# Patient Record
Sex: Female | Born: 1965
Health system: Southern US, Community
[De-identification: ages and names within clinical notes are randomized; demographics above are authoritative.]

## PROBLEM LIST (undated history)

## (undated) DIAGNOSIS — R519 Headache, unspecified: Secondary | ICD-10-CM

## (undated) DIAGNOSIS — F32A Depression, unspecified: Secondary | ICD-10-CM

## (undated) DIAGNOSIS — F410 Panic disorder [episodic paroxysmal anxiety] without agoraphobia: Secondary | ICD-10-CM

## (undated) DIAGNOSIS — G8929 Other chronic pain: Secondary | ICD-10-CM

## (undated) DIAGNOSIS — K219 Gastro-esophageal reflux disease without esophagitis: Secondary | ICD-10-CM

## (undated) DIAGNOSIS — L03039 Cellulitis of unspecified toe: Secondary | ICD-10-CM

## (undated) DIAGNOSIS — N289 Disorder of kidney and ureter, unspecified: Secondary | ICD-10-CM

## (undated) DIAGNOSIS — F329 Major depressive disorder, single episode, unspecified: Secondary | ICD-10-CM

## (undated) DIAGNOSIS — F419 Anxiety disorder, unspecified: Secondary | ICD-10-CM

## (undated) DIAGNOSIS — E119 Type 2 diabetes mellitus without complications: Secondary | ICD-10-CM

## (undated) DIAGNOSIS — E7800 Pure hypercholesterolemia, unspecified: Secondary | ICD-10-CM

## (undated) DIAGNOSIS — I10 Essential (primary) hypertension: Secondary | ICD-10-CM

## (undated) DIAGNOSIS — R51 Headache: Secondary | ICD-10-CM

## (undated) DIAGNOSIS — F819 Developmental disorder of scholastic skills, unspecified: Secondary | ICD-10-CM

## (undated) DIAGNOSIS — B351 Tinea unguium: Secondary | ICD-10-CM

## (undated) DIAGNOSIS — Z87442 Personal history of urinary calculi: Secondary | ICD-10-CM

## (undated) HISTORY — PX: KIDNEY STONE SURGERY: SHX686

## (undated) HISTORY — PX: HAND SURGERY: SHX662

---

## 2003-12-08 ENCOUNTER — Other Ambulatory Visit: Payer: Self-pay

## 2004-07-09 ENCOUNTER — Other Ambulatory Visit: Payer: Self-pay

## 2004-09-22 ENCOUNTER — Emergency Department: Payer: Self-pay | Admitting: Unknown Physician Specialty

## 2004-10-18 ENCOUNTER — Emergency Department: Payer: Self-pay | Admitting: General Practice

## 2005-01-06 ENCOUNTER — Emergency Department: Payer: Self-pay | Admitting: Emergency Medicine

## 2005-11-16 ENCOUNTER — Emergency Department: Payer: Self-pay | Admitting: Emergency Medicine

## 2006-02-08 ENCOUNTER — Ambulatory Visit: Payer: Self-pay | Admitting: Internal Medicine

## 2006-02-16 ENCOUNTER — Emergency Department: Payer: Self-pay | Admitting: Emergency Medicine

## 2006-02-27 ENCOUNTER — Emergency Department: Payer: Self-pay | Admitting: Emergency Medicine

## 2006-04-11 ENCOUNTER — Inpatient Hospital Stay (HOSPITAL_COMMUNITY): Admission: RE | Admit: 2006-04-11 | Discharge: 2006-04-18 | Payer: Self-pay | Admitting: *Deleted

## 2006-04-12 ENCOUNTER — Ambulatory Visit: Payer: Self-pay | Admitting: *Deleted

## 2006-07-05 ENCOUNTER — Ambulatory Visit: Payer: Self-pay | Admitting: Gastroenterology

## 2007-04-01 ENCOUNTER — Emergency Department: Payer: Self-pay

## 2007-04-01 ENCOUNTER — Other Ambulatory Visit: Payer: Self-pay

## 2007-04-05 ENCOUNTER — Ambulatory Visit: Payer: Self-pay | Admitting: Cardiovascular Disease

## 2007-05-01 ENCOUNTER — Emergency Department: Payer: Self-pay | Admitting: Emergency Medicine

## 2007-08-21 ENCOUNTER — Ambulatory Visit: Payer: Self-pay | Admitting: Internal Medicine

## 2007-11-07 ENCOUNTER — Ambulatory Visit: Payer: Self-pay | Admitting: Internal Medicine

## 2008-03-21 ENCOUNTER — Emergency Department: Payer: Self-pay | Admitting: Unknown Physician Specialty

## 2008-06-24 ENCOUNTER — Ambulatory Visit: Payer: Self-pay | Admitting: Internal Medicine

## 2008-07-04 ENCOUNTER — Ambulatory Visit: Payer: Self-pay | Admitting: Internal Medicine

## 2008-08-04 ENCOUNTER — Ambulatory Visit: Payer: Self-pay | Admitting: Internal Medicine

## 2008-09-03 ENCOUNTER — Ambulatory Visit: Payer: Self-pay | Admitting: Internal Medicine

## 2008-11-10 ENCOUNTER — Ambulatory Visit: Payer: Self-pay | Admitting: Internal Medicine

## 2009-08-18 ENCOUNTER — Ambulatory Visit: Payer: Self-pay | Admitting: Gastroenterology

## 2009-08-21 ENCOUNTER — Emergency Department: Payer: Self-pay | Admitting: Emergency Medicine

## 2009-11-16 ENCOUNTER — Ambulatory Visit: Payer: Self-pay | Admitting: Internal Medicine

## 2010-04-25 ENCOUNTER — Ambulatory Visit: Payer: Self-pay | Admitting: Gastroenterology

## 2010-05-06 ENCOUNTER — Ambulatory Visit: Payer: Self-pay | Admitting: Internal Medicine

## 2010-05-16 ENCOUNTER — Ambulatory Visit: Payer: Self-pay | Admitting: Internal Medicine

## 2010-06-29 ENCOUNTER — Ambulatory Visit: Payer: Self-pay | Admitting: General Practice

## 2010-07-29 ENCOUNTER — Other Ambulatory Visit: Payer: Self-pay | Admitting: Physician Assistant

## 2010-08-17 ENCOUNTER — Ambulatory Visit: Payer: Self-pay | Admitting: Gastroenterology

## 2010-10-14 ENCOUNTER — Emergency Department: Payer: Self-pay | Admitting: Emergency Medicine

## 2010-11-29 ENCOUNTER — Ambulatory Visit: Payer: Self-pay | Admitting: Internal Medicine

## 2011-01-15 ENCOUNTER — Emergency Department: Payer: Self-pay | Admitting: Emergency Medicine

## 2011-03-27 ENCOUNTER — Ambulatory Visit: Payer: Self-pay | Admitting: Urology

## 2011-04-27 ENCOUNTER — Ambulatory Visit: Payer: Self-pay | Admitting: Urology

## 2011-05-02 ENCOUNTER — Ambulatory Visit: Payer: Self-pay | Admitting: Urology

## 2011-05-10 ENCOUNTER — Ambulatory Visit: Payer: Self-pay | Admitting: Urology

## 2011-05-28 ENCOUNTER — Emergency Department: Payer: Self-pay | Admitting: Emergency Medicine

## 2011-08-11 ENCOUNTER — Emergency Department: Payer: Self-pay | Admitting: Emergency Medicine

## 2011-09-18 ENCOUNTER — Ambulatory Visit: Payer: Self-pay | Admitting: Urology

## 2011-09-29 ENCOUNTER — Ambulatory Visit: Payer: Self-pay | Admitting: Urology

## 2011-10-10 ENCOUNTER — Ambulatory Visit: Payer: Self-pay | Admitting: Urology

## 2011-12-02 ENCOUNTER — Emergency Department: Payer: Self-pay | Admitting: *Deleted

## 2012-01-08 ENCOUNTER — Emergency Department: Payer: Self-pay | Admitting: Emergency Medicine

## 2012-02-14 ENCOUNTER — Emergency Department: Payer: Self-pay | Admitting: *Deleted

## 2012-02-14 LAB — BASIC METABOLIC PANEL
Anion Gap: 11 (ref 7–16)
BUN: 15 mg/dL (ref 7–18)
Calcium, Total: 8.7 mg/dL (ref 8.5–10.1)
Co2: 22 mmol/L (ref 21–32)
EGFR (Non-African Amer.): >60
Osmolality: 281 (ref 275–301)
Potassium: 4.3 mmol/L (ref 3.5–5.1)
Sodium: 141 mmol/L (ref 136–145)

## 2012-02-14 LAB — CBC
HCT: 35.2 % (ref 35.0–47.0)
HGB: 11.6 g/dL — ABNORMAL LOW (ref 12.0–16.0)
MCHC: 33.1 g/dL (ref 32.0–36.0)
MCV: 87 fL (ref 80–100)
Platelet: 314 10*3/uL (ref 150–440)
WBC: 5.2 10*3/uL (ref 3.6–11.0)

## 2012-02-14 LAB — TROPONIN I: Troponin-I: <0.02 ng/mL

## 2012-02-15 LAB — HEPATIC FUNCTION PANEL A (ARMC)
Albumin: 4.4 g/dL (ref 3.4–5.0)
Alkaline Phosphatase: 28 U/L — ABNORMAL LOW (ref 50–136)
Bilirubin, Direct: <0.1 mg/dL (ref 0.00–0.20)
Bilirubin,Total: 0.2 mg/dL (ref 0.2–1.0)
SGOT(AST): 26 U/L (ref 15–37)
Total Protein: 6.9 g/dL (ref 6.4–8.2)

## 2012-02-15 LAB — CK TOTAL AND CKMB (NOT AT ARMC)
CK, Total: 50 U/L (ref 21–215)
CK-MB: <0.5 ng/mL — ABNORMAL LOW (ref 0.5–3.6)

## 2012-02-15 LAB — TROPONIN I: Troponin-I: <0.02 ng/mL

## 2012-04-08 ENCOUNTER — Emergency Department: Payer: Self-pay | Admitting: Emergency Medicine

## 2012-04-08 LAB — COMPREHENSIVE METABOLIC PANEL
Alkaline Phosphatase: 30 U/L — ABNORMAL LOW (ref 50–136)
Calcium, Total: 8.6 mg/dL (ref 8.5–10.1)
Chloride: 108 mmol/L — ABNORMAL HIGH (ref 98–107)
Creatinine: 0.72 mg/dL (ref 0.60–1.30)
EGFR (African American): >60
EGFR (Non-African Amer.): >60
Glucose: 132 mg/dL — ABNORMAL HIGH (ref 65–99)
SGOT(AST): 19 U/L (ref 15–37)
SGPT (ALT): 34 U/L
Total Protein: 6.8 g/dL (ref 6.4–8.2)

## 2012-04-08 LAB — URINALYSIS, COMPLETE
Ketone: NEGATIVE
Nitrite: NEGATIVE
Ph: 6 (ref 4.5–8.0)
Protein: NEGATIVE
RBC,UR: 11 /HPF (ref 0–5)
Specific Gravity: 1.017 (ref 1.003–1.030)
WBC UR: 1 /HPF (ref 0–5)

## 2012-04-08 LAB — CK TOTAL AND CKMB (NOT AT ARMC)
CK, Total: 38 U/L (ref 21–215)
CK-MB: <0.5 ng/mL — ABNORMAL LOW (ref 0.5–3.6)

## 2012-04-08 LAB — CBC
HCT: 35.5 % (ref 35.0–47.0)
HGB: 11.8 g/dL — ABNORMAL LOW (ref 12.0–16.0)
MCV: 86 fL (ref 80–100)
Platelet: 253 10*3/uL (ref 150–440)
RBC: 4.13 10*6/uL (ref 3.80–5.20)
WBC: 5.8 10*3/uL (ref 3.6–11.0)

## 2012-04-08 LAB — LIPASE, BLOOD: Lipase: 80 U/L (ref 73–393)

## 2012-06-18 ENCOUNTER — Emergency Department: Payer: Self-pay | Admitting: *Deleted

## 2012-06-18 LAB — COMPREHENSIVE METABOLIC PANEL
Albumin: 4.4 g/dL (ref 3.4–5.0)
Alkaline Phosphatase: 63 U/L (ref 50–136)
BUN: 13 mg/dL (ref 7–18)
Bilirubin,Total: 0.4 mg/dL (ref 0.2–1.0)
Calcium, Total: 8.9 mg/dL (ref 8.5–10.1)
Chloride: 110 mmol/L — ABNORMAL HIGH (ref 98–107)
Co2: 25 mmol/L (ref 21–32)
Creatinine: 0.69 mg/dL (ref 0.60–1.30)
EGFR (Non-African Amer.): >60
Glucose: 104 mg/dL — ABNORMAL HIGH (ref 65–99)
Osmolality: 287 (ref 275–301)
SGOT(AST): 20 U/L (ref 15–37)
SGPT (ALT): 29 U/L
Total Protein: 7.5 g/dL (ref 6.4–8.2)

## 2012-06-18 LAB — URINALYSIS, COMPLETE
Bilirubin,UR: NEGATIVE
Glucose,UR: NEGATIVE mg/dL (ref 0–75)
Ketone: NEGATIVE
Leukocyte Esterase: NEGATIVE
Ph: 5 (ref 4.5–8.0)
Protein: 30
RBC,UR: 7 /HPF (ref 0–5)
Squamous Epithelial: 1
WBC UR: 2 /HPF (ref 0–5)

## 2012-06-18 LAB — CBC
HCT: 38.8 % (ref 35.0–47.0)
HGB: 12.5 g/dL (ref 12.0–16.0)
MCH: 27.9 pg (ref 26.0–34.0)
MCV: 87 fL (ref 80–100)
Platelet: 230 10*3/uL (ref 150–440)
RBC: 4.47 10*6/uL (ref 3.80–5.20)
WBC: 5.9 10*3/uL (ref 3.6–11.0)

## 2012-06-28 ENCOUNTER — Emergency Department: Payer: Self-pay | Admitting: Emergency Medicine

## 2013-03-11 ENCOUNTER — Ambulatory Visit: Payer: Self-pay | Admitting: Urology

## 2013-03-11 LAB — HCG, QUANTITATIVE, PREGNANCY: Beta Hcg, Quant.: <1 m[IU]/mL — ABNORMAL LOW

## 2013-03-19 ENCOUNTER — Ambulatory Visit: Payer: Self-pay

## 2013-04-14 ENCOUNTER — Ambulatory Visit: Payer: Self-pay | Admitting: Urology

## 2013-04-14 ENCOUNTER — Ambulatory Visit: Payer: Self-pay

## 2013-04-14 LAB — CBC WITH DIFFERENTIAL/PLATELET
Basophil %: 0.7 %
Eosinophil #: 0.1 10*3/uL (ref 0.0–0.7)
Eosinophil %: 2.1 %
HGB: 13 g/dL (ref 12.0–16.0)
Lymphocyte #: 1.5 10*3/uL (ref 1.0–3.6)
Lymphocyte %: 25.7 %
MCH: 29.3 pg (ref 26.0–34.0)
MCHC: 33.8 g/dL (ref 32.0–36.0)
MCV: 87 fL (ref 80–100)
Monocyte #: 0.4 x10 3/mm (ref 0.2–0.9)
Monocyte %: 7.3 %
Neutrophil %: 64.2 %
RBC: 4.43 10*6/uL (ref 3.80–5.20)
RDW: 14.4 % (ref 11.5–14.5)

## 2013-04-14 LAB — BASIC METABOLIC PANEL
BUN: 9 mg/dL (ref 7–18)
Calcium, Total: 9 mg/dL (ref 8.5–10.1)
Co2: 28 mmol/L (ref 21–32)
EGFR (Non-African Amer.): >60
Glucose: 107 mg/dL — ABNORMAL HIGH (ref 65–99)
Osmolality: 284 (ref 275–301)
Sodium: 143 mmol/L (ref 136–145)

## 2013-04-16 ENCOUNTER — Ambulatory Visit: Payer: Self-pay | Admitting: Urology

## 2013-08-19 ENCOUNTER — Ambulatory Visit: Payer: Self-pay | Admitting: Nurse Practitioner

## 2014-02-27 ENCOUNTER — Emergency Department: Payer: Self-pay | Admitting: Emergency Medicine

## 2014-03-03 ENCOUNTER — Ambulatory Visit: Payer: Self-pay | Admitting: Urology

## 2014-07-08 ENCOUNTER — Emergency Department: Payer: Self-pay | Admitting: Emergency Medicine

## 2014-07-08 LAB — BASIC METABOLIC PANEL
Anion Gap: 9 (ref 7–16)
BUN: 12 mg/dL (ref 7–18)
CHLORIDE: 111 mmol/L — AB (ref 98–107)
Calcium, Total: 8.8 mg/dL (ref 8.5–10.1)
Co2: 22 mmol/L (ref 21–32)
Creatinine: 0.7 mg/dL (ref 0.60–1.30)
EGFR (Non-African Amer.): >60
GLUCOSE: 157 mg/dL — AB (ref 65–99)
Osmolality: 286 (ref 275–301)
Potassium: 3.7 mmol/L (ref 3.5–5.1)
Sodium: 142 mmol/L (ref 136–145)

## 2014-07-08 LAB — CBC
HCT: 41.1 % (ref 35.0–47.0)
HGB: 13.4 g/dL (ref 12.0–16.0)
MCH: 30.2 pg (ref 26.0–34.0)
MCHC: 32.7 g/dL (ref 32.0–36.0)
MCV: 92 fL (ref 80–100)
Platelet: 231 10*3/uL (ref 150–440)
RBC: 4.45 10*6/uL (ref 3.80–5.20)
RDW: 14.6 % — ABNORMAL HIGH (ref 11.5–14.5)
WBC: 7.8 10*3/uL (ref 3.6–11.0)

## 2014-07-08 LAB — TROPONIN I: Troponin-I: <0.02 ng/mL

## 2014-08-13 ENCOUNTER — Emergency Department: Payer: Self-pay | Admitting: Emergency Medicine

## 2014-08-13 LAB — CBC WITH DIFFERENTIAL/PLATELET
BASOS PCT: 0.7 %
Basophil #: 0 10*3/uL (ref 0.0–0.1)
EOS ABS: 0.2 10*3/uL (ref 0.0–0.7)
Eosinophil %: 3.5 %
HCT: 40 % (ref 35.0–47.0)
HGB: 13 g/dL (ref 12.0–16.0)
LYMPHS ABS: 2.2 10*3/uL (ref 1.0–3.6)
LYMPHS PCT: 33.2 %
MCH: 29.9 pg (ref 26.0–34.0)
MCHC: 32.6 g/dL (ref 32.0–36.0)
MCV: 92 fL (ref 80–100)
MONO ABS: 0.5 x10 3/mm (ref 0.2–0.9)
Monocyte %: 8.2 %
NEUTROS ABS: 3.6 10*3/uL (ref 1.4–6.5)
NEUTROS PCT: 54.4 %
PLATELETS: 245 10*3/uL (ref 150–440)
RBC: 4.36 10*6/uL (ref 3.80–5.20)
RDW: 13.8 % (ref 11.5–14.5)
WBC: 6.6 10*3/uL (ref 3.6–11.0)

## 2014-08-13 LAB — COMPREHENSIVE METABOLIC PANEL
ALK PHOS: 55 U/L
AST: 8 U/L — AB (ref 15–37)
Albumin: 4.1 g/dL (ref 3.4–5.0)
Anion Gap: 7 (ref 7–16)
BILIRUBIN TOTAL: 0.3 mg/dL (ref 0.2–1.0)
BUN: 16 mg/dL (ref 7–18)
CO2: 26 mmol/L (ref 21–32)
CREATININE: 0.62 mg/dL (ref 0.60–1.30)
Calcium, Total: 8.8 mg/dL (ref 8.5–10.1)
Chloride: 110 mmol/L — ABNORMAL HIGH (ref 98–107)
EGFR (African American): >60
EGFR (Non-African Amer.): >60
GLUCOSE: 93 mg/dL (ref 65–99)
Osmolality: 286 (ref 275–301)
POTASSIUM: 3.5 mmol/L (ref 3.5–5.1)
SGPT (ALT): 21 U/L
Sodium: 143 mmol/L (ref 136–145)
Total Protein: 7.1 g/dL (ref 6.4–8.2)

## 2014-08-20 ENCOUNTER — Emergency Department: Payer: Self-pay | Admitting: Emergency Medicine

## 2014-08-20 LAB — CBC WITH DIFFERENTIAL/PLATELET
BASOS PCT: 0.7 %
Basophil #: 0.1 10*3/uL (ref 0.0–0.1)
EOS ABS: 0.3 10*3/uL (ref 0.0–0.7)
Eosinophil %: 3.8 %
HCT: 42.6 % (ref 35.0–47.0)
HGB: 13.6 g/dL (ref 12.0–16.0)
LYMPHS ABS: 2.3 10*3/uL (ref 1.0–3.6)
Lymphocyte %: 31 %
MCH: 29.4 pg (ref 26.0–34.0)
MCHC: 31.8 g/dL — AB (ref 32.0–36.0)
MCV: 92 fL (ref 80–100)
MONO ABS: 0.5 x10 3/mm (ref 0.2–0.9)
MONOS PCT: 6.5 %
NEUTROS ABS: 4.3 10*3/uL (ref 1.4–6.5)
Neutrophil %: 58 %
Platelet: 241 10*3/uL (ref 150–440)
RBC: 4.61 10*6/uL (ref 3.80–5.20)
RDW: 14 % (ref 11.5–14.5)
WBC: 7.4 10*3/uL (ref 3.6–11.0)

## 2014-08-20 LAB — URINALYSIS, COMPLETE
Bilirubin,UR: NEGATIVE
Glucose,UR: NEGATIVE mg/dL (ref 0–75)
KETONE: NEGATIVE
Nitrite: NEGATIVE
Ph: 5 (ref 4.5–8.0)
Protein: 30
RBC,UR: 7 /HPF (ref 0–5)
Specific Gravity: 1.019 (ref 1.003–1.030)
Squamous Epithelial: 2

## 2014-08-20 LAB — COMPREHENSIVE METABOLIC PANEL
ALT: 28 U/L
Albumin: 4.2 g/dL (ref 3.4–5.0)
Alkaline Phosphatase: 62 U/L
Anion Gap: 7 (ref 7–16)
BUN: 12 mg/dL (ref 7–18)
Bilirubin,Total: 0.3 mg/dL (ref 0.2–1.0)
CALCIUM: 8.9 mg/dL (ref 8.5–10.1)
CHLORIDE: 110 mmol/L — AB (ref 98–107)
Co2: 22 mmol/L (ref 21–32)
Creatinine: 0.55 mg/dL — ABNORMAL LOW (ref 0.60–1.30)
EGFR (African American): >60
EGFR (Non-African Amer.): >60
GLUCOSE: 98 mg/dL (ref 65–99)
Osmolality: 277 (ref 275–301)
Potassium: 3.6 mmol/L (ref 3.5–5.1)
SGOT(AST): 15 U/L (ref 15–37)
SODIUM: 139 mmol/L (ref 136–145)
Total Protein: 7.4 g/dL (ref 6.4–8.2)

## 2014-08-27 ENCOUNTER — Ambulatory Visit: Payer: Self-pay | Admitting: Nurse Practitioner

## 2014-09-07 ENCOUNTER — Ambulatory Visit: Payer: Self-pay | Admitting: Nurse Practitioner

## 2014-09-07 ENCOUNTER — Emergency Department: Payer: Self-pay | Admitting: Emergency Medicine

## 2014-09-07 LAB — URINALYSIS, COMPLETE
Bacteria: NONE SEEN
Bilirubin,UR: NEGATIVE
GLUCOSE, UR: NEGATIVE mg/dL (ref 0–75)
Ketone: NEGATIVE
NITRITE: NEGATIVE
PROTEIN: NEGATIVE
Ph: 5 (ref 4.5–8.0)
RBC,UR: 41 /HPF (ref 0–5)
SPECIFIC GRAVITY: 1.023 (ref 1.003–1.030)

## 2014-09-07 LAB — CBC WITH DIFFERENTIAL/PLATELET
Basophil #: 0.1 10*3/uL (ref 0.0–0.1)
Basophil %: 0.8 %
Eosinophil #: 0.3 10*3/uL (ref 0.0–0.7)
Eosinophil %: 4 %
HCT: 38.9 % (ref 35.0–47.0)
HGB: 12.4 g/dL (ref 12.0–16.0)
Lymphocyte #: 1.8 10*3/uL (ref 1.0–3.6)
Lymphocyte %: 28 %
MCH: 29.6 pg (ref 26.0–34.0)
MCHC: 31.9 g/dL — ABNORMAL LOW (ref 32.0–36.0)
MCV: 93 fL (ref 80–100)
Monocyte #: 0.5 x10 3/mm (ref 0.2–0.9)
Monocyte %: 8.1 %
Neutrophil #: 3.9 10*3/uL (ref 1.4–6.5)
Neutrophil %: 59.1 %
Platelet: 230 10*3/uL (ref 150–440)
RBC: 4.19 10*6/uL (ref 3.80–5.20)
RDW: 13.3 % (ref 11.5–14.5)
WBC: 6.6 10*3/uL (ref 3.6–11.0)

## 2014-09-07 LAB — COMPREHENSIVE METABOLIC PANEL
Albumin: 4 g/dL (ref 3.4–5.0)
Alkaline Phosphatase: 52 U/L
Anion Gap: 5 — ABNORMAL LOW (ref 7–16)
BUN: 16 mg/dL (ref 7–18)
Bilirubin,Total: 0.3 mg/dL (ref 0.2–1.0)
Calcium, Total: 8.5 mg/dL (ref 8.5–10.1)
Chloride: 112 mmol/L — ABNORMAL HIGH (ref 98–107)
Co2: 25 mmol/L (ref 21–32)
Creatinine: 0.58 mg/dL — ABNORMAL LOW (ref 0.60–1.30)
Glucose: 126 mg/dL — ABNORMAL HIGH (ref 65–99)
Osmolality: 286 (ref 275–301)
Potassium: 3.8 mmol/L (ref 3.5–5.1)
SGOT(AST): 9 U/L — ABNORMAL LOW (ref 15–37)
SGPT (ALT): 25 U/L
Sodium: 142 mmol/L (ref 136–145)
Total Protein: 6.6 g/dL (ref 6.4–8.2)

## 2014-09-07 LAB — PREGNANCY, URINE: Pregnancy Test, Urine: NEGATIVE m[IU]/mL

## 2014-09-07 LAB — LIPASE, BLOOD: LIPASE: 137 U/L (ref 73–393)

## 2014-09-24 ENCOUNTER — Ambulatory Visit: Payer: Self-pay

## 2014-10-18 ENCOUNTER — Emergency Department: Payer: Self-pay | Admitting: Emergency Medicine

## 2014-12-04 ENCOUNTER — Emergency Department: Payer: Self-pay | Admitting: Emergency Medicine

## 2014-12-07 LAB — BETA STREP CULTURE(ARMC)

## 2015-01-07 ENCOUNTER — Emergency Department: Payer: Self-pay | Admitting: Emergency Medicine

## 2015-03-12 ENCOUNTER — Emergency Department
Admit: 2015-03-12 | Disposition: A | Discharge status: Home / Self Care | Payer: Self-pay | Admitting: Emergency Medicine

## 2015-03-12 LAB — COMPREHENSIVE METABOLIC PANEL
ALBUMIN: 4.5 g/dL
ALK PHOS: 55 U/L
ALT: 16 U/L
Anion Gap: 9 (ref 7–16)
BUN: 16 mg/dL
Bilirubin,Total: 0.3 mg/dL
CREATININE: 0.57 mg/dL
Calcium, Total: 9.6 mg/dL
Chloride: 108 mmol/L
Co2: 24 mmol/L
EGFR (African American): >60
EGFR (Non-African Amer.): >60
Glucose: 108 mg/dL — ABNORMAL HIGH
POTASSIUM: 3.6 mmol/L
SGOT(AST): 16 U/L
SODIUM: 141 mmol/L
TOTAL PROTEIN: 7 g/dL

## 2015-03-12 LAB — CBC WITH DIFFERENTIAL/PLATELET
BASOS PCT: 0.9 %
Basophil #: 0.1 10*3/uL (ref 0.0–0.1)
Eosinophil #: 0.4 10*3/uL (ref 0.0–0.7)
Eosinophil %: 4 %
HCT: 41.1 % (ref 35.0–47.0)
HGB: 13.6 g/dL (ref 12.0–16.0)
LYMPHS ABS: 2.6 10*3/uL (ref 1.0–3.6)
Lymphocyte %: 29 %
MCH: 29.8 pg (ref 26.0–34.0)
MCHC: 33.2 g/dL (ref 32.0–36.0)
MCV: 90 fL (ref 80–100)
MONOS PCT: 9.1 %
Monocyte #: 0.8 x10 3/mm (ref 0.2–0.9)
Neutrophil #: 5.1 10*3/uL (ref 1.4–6.5)
Neutrophil %: 57 %
Platelet: 247 10*3/uL (ref 150–440)
RBC: 4.57 10*6/uL (ref 3.80–5.20)
RDW: 13.2 % (ref 11.5–14.5)
WBC: 8.9 10*3/uL (ref 3.6–11.0)

## 2015-03-12 LAB — URINALYSIS, COMPLETE
Bilirubin,UR: NEGATIVE
Glucose,UR: NEGATIVE mg/dL (ref 0–75)
Ketone: NEGATIVE
LEUKOCYTE ESTERASE: NEGATIVE
Nitrite: NEGATIVE
PH: 5 (ref 4.5–8.0)
Protein: 100
Specific Gravity: 1.028 (ref 1.003–1.030)

## 2015-03-12 LAB — PREGNANCY, URINE: Pregnancy Test, Urine: NEGATIVE m[IU]/mL

## 2015-03-14 LAB — URINE CULTURE

## 2015-03-26 NOTE — Op Note (Signed)
PATIENT NAME:  NILE, DORNING MR#:  [id number] DATE OF BIRTH:  1966-02-19  DATE OF PROCEDURE:  04/16/2013  PREOPERATIVE DIAGNOSIS: By CAT scan, a 5 mm left renal calculus.   POSTOPERATIVE DIAGNOSIS: No calculus present by digital flexible ureteroscope.   PROCEDURES:   1.  Cystoscopy.  2.  Left retrograde pyelogram.  3.  Flexible ureteroscopy.   SURGEON: Richard D. Elnoria Howard, DO  DESCRIPTION OF PROCEDURE: With the patient sterilely prepped and draped in supine lithotomy position for ease of approach to the external genitalia, the procedure was begun. The patient has good relaxation from a general anesthetic.  A cystoscope was used to view the bladder.  No tumors, masses or growths were seen in the bladder. The bladder has no trabeculation or evidence of infection. So, I do a left retrograde up the left ureteral orifice which is in a B position. The left retrograde reveals no ureteral calculi, and I see no filling defects in the kidney itself. So, I put an 0.038 wire up through the open-ended retrograde catheter, take the retrograde catheter out, put a second wire up. The first wire is a safety wire.  The second wire is used for a Navigator access catheter which is placed in the left ureter. Then a flexible ureteroscope, which is digital ureteroscope, was placed up the left ureter into the kidney. I have a beautiful wide-angle view of all calyces and find no stones. I checked every calyceal opening. Since there are no calculi, I stop the procedure, take out the wires and take out the scope and the Navigator. The bladder is emptied.  The patient has a B and O suppository placed, and she is sent to recovery in satisfactory condition.   ____________________________ Janice Coffin. Elnoria Howard, Ashley rdh:cb D: 04/16/2013 15:02:41 ET T: 04/16/2013 15:53:04 ET JOB#: [id number]  cc: Delfino Lovett D. Elnoria Howard, DO, <Dictator> RICHARD D HART DO ELECTRONICALLY SIGNED 05/01/2013 17:15

## 2015-06-12 ENCOUNTER — Emergency Department
Admission: EM | Admit: 2015-06-12 | Discharge: 2015-06-12 | Disposition: A | Discharge status: Home / Self Care | Payer: Medicare Other | Attending: Emergency Medicine | Admitting: Emergency Medicine

## 2015-06-12 DIAGNOSIS — H9203 Otalgia, bilateral: Secondary | ICD-10-CM | POA: Diagnosis not present

## 2015-06-12 MED ORDER — NAPROXEN 500 MG PO TBEC: 500.0000 mg | DELAYED_RELEASE_TABLET | Freq: Two times a day (BID) | ORAL | Status: DC

## 2015-06-12 MED ORDER — IBUPROFEN 800 MG PO TABS
800.0000 mg | ORAL_TABLET | Freq: Three times a day (TID) | ORAL | Status: DC
  Administered 2015-06-12: 800 mg via ORAL
  Filled 2015-06-12: qty 1

## 2015-06-12 NOTE — Discharge Instructions (Signed)
Otalgia Otalgia is pain in or around the ear. When the pain is from the ear itself it is called primary otalgia. Pain may also be coming from somewhere else, like the head and neck. This is called secondary otalgia.  CAUSES  Causes of primary otalgia include:  Middle ear infection.  It can also be caused by injury to the ear or infection of the ear canal (swimmer's ear). Swimmer's ear causes pain, swelling and often drainage from the ear canal. Causes of secondary otalgia include:  Sinus infections.  Allergies and colds that cause stuffiness of the nose and tubes that drain the ears (eustachian tubes).  Dental problems like cavities, gum infections or teething.  Sore Throat (tonsillitis and pharyngitis).  Swollen glands in the neck.  Infection of the bone behind the ear (mastoiditis).  TMJ discomfort (problems with the joint between your jaw and your skull).  Other problems such as nerve disorders, circulation problems, heart disease and tumors of the head and neck can also cause symptoms of ear pain. This is rare. DIAGNOSIS  Evaluation, Diagnosis and Testing:  Examination by your medical caregiver is recommended to evaluate and diagnose the cause of otalgia.  Further testing or referral to a specialist may be indicated if the cause of the ear pain is not found and the symptom persists. TREATMENT   Your doctor may prescribe antibiotics if an ear infection is diagnosed.  Pain relievers and topical analgesics may be recommended.  It is important to take all medications as prescribed. HOME CARE INSTRUCTIONS   It may be helpful to sleep with the painful ear in the up position.  A warm compress over the painful ear may provide relief.  A soft diet and avoiding gum may help while ear pain is present. SEEK IMMEDIATE MEDICAL CARE IF:  You develop severe pain, a high fever, repeated vomiting or dehydration.  You develop extreme dizziness, headache, confusion, ringing in the  ears (tinnitus) or hearing loss. Document Released: 12/28/2004 Document Revised: 02/12/2012 Document Reviewed: 09/29/2009 [facility] Patient Information 2015 [place], [place]. This information is not intended to replace advice given to you by your health care provider. Make sure you discuss any questions you have with your health care provider.  Your exam was normal today.  There is no sign of infection in the ears.  Follow-up with [facility] as needed.  Take the prescription pain medicine as needed.

## 2015-06-12 NOTE — ED Provider Notes (Signed)
[facility] Emergency Department Provider Note ____________________________________________  Time seen: 2250  I have reviewed the triage vital signs and the nursing notes.  HISTORY  Chief Complaint  Otalgia  HPI Hannah Clarke is a 49 y.o. female reports to the ED with complaints of bilateral earache since Wednesday. She denies any injury or trauma, but reports that she did spend this week at camp, and has been swimming over the last several days. She describes the pain in the right his pain worsen that in the left, but denies any ear discharge, hearing loss, or drainage from the ear. She denies any fevers, chills, sweats, vertigo, or headache. She describes the pain is worse with swallowing and chewing. She also reports some swelling to the right cheek but that has resolved with ibuprofen and Tylenol. He rates the pain at 8 out of 10 in triage.  No past medical history on file.  There are no active problems to display for this patient.  No past surgical history on file.  Current Outpatient Rx  Name  Route  Sig  Dispense  Refill  . naproxen (EC NAPROSYN) 500 MG EC tablet   Oral   Take 1 tablet (500 mg total) by mouth 2 (two) times daily with a meal.   30 tablet   0    Allergies Review of patient's allergies indicates not on file.  No family history on file.  Social History History  Substance Use Topics  . Smoking status: Not on file  . Smokeless tobacco: Not on file  . Alcohol Use: Not on file   Review of Systems  Constitutional: Negative for fever. Eyes: Negative for visual changes. ENT: Negative for sore throat. Reports R>L earaches. Cardiovascular: Negative for chest pain. Respiratory: Negative for shortness of breath. Gastrointestinal: Negative for abdominal pain, vomiting and diarrhea. Genitourinary: Negative for dysuria. Musculoskeletal: Negative for back pain. Skin: Negative for rash. Neurological: Negative for headaches, focal  weakness or numbness. ____________________________________________  PHYSICAL EXAM:  VITAL SIGNS: ED Triage Vitals  Enc Vitals Group     BP 06/12/15 2056 154/77 mmHg     Pulse Rate 06/12/15 2056 75     Resp 06/12/15 2056 20     Temp 06/12/15 2056 98.5 F (36.9 C)     Temp Source 06/12/15 2056 Oral     SpO2 06/12/15 2056 98 %     Weight 06/12/15 2056 175 lb 5 oz (79.521 kg)     Height --      Head Cir --      Peak Flow --      Pain Score 06/12/15 2059 8     Pain Loc --      Pain Edu? --      Excl. in [place]? --    Constitutional: Alert and oriented. Well appearing and in no distress. Eyes: Conjunctivae are normal. PERRL. Normal extraocular movements. ENT   Head: Normocephalic and atraumatic.   Nose: No congestion/rhinnorhea.      Ears: Canals clear bilaterally with intact TMs without effusion, erythema, bulging, or purulent material.    Mouth/Throat: Mucous membranes are moist.   Neck: Supple. No thyromegaly. Hematological/Lymphatic/Immunilogical: No cervical/preauricular/postauricular lymphadenopathy. Cardiovascular: Normal rate, regular rhythm.  Respiratory: Normal respiratory effort. No wheezes/rales/rhonchi. Gastrointestinal: Soft and nontender. No distention. Musculoskeletal: Nontender with normal range of motion in all extremities.  Neurologic:  Normal gait without ataxia. Normal speech and language. No gross focal neurologic deficits are appreciated. Skin:  Skin is warm, dry and intact. No  rash noted. Psychiatric: Mood and affect are normal. Patient exhibits appropriate insight and judgment. ____________________________________________  PROCEDURES  Ibuprofen 800 mg PO ____________________________________________  INITIAL IMPRESSION / ASSESSMENT AND PLAN / ED COURSE  Bilateral Otalgia without evidence of serous effusion, otitis externa, or TM rupture. Take the prescription Naproxen as directed. Follow-up with Vivianne Master as planned.   ____________________________________________  FINAL CLINICAL IMPRESSION(S) / ED DIAGNOSES  Final diagnoses:  Otalgia, bilateral     Melvenia Needles, PA-C 06/13/15 0014  Hinda Kehr, MD 06/13/15 0020

## 2015-06-12 NOTE — ED Notes (Signed)
Pt c/o bilateral earaches since Wednesday; says she's been swimming at camp this week; right ear hurts worse than left

## 2015-07-03 ENCOUNTER — Emergency Department
Admission: EM | Admit: 2015-07-03 | Discharge: 2015-07-03 | Disposition: A | Discharge status: Home / Self Care | Payer: Medicare Other | Attending: Emergency Medicine | Admitting: Emergency Medicine

## 2015-07-03 ENCOUNTER — Emergency Department: Payer: Medicare Other

## 2015-07-03 ENCOUNTER — Encounter: Payer: Self-pay | Admitting: Emergency Medicine

## 2015-07-03 DIAGNOSIS — E119 Type 2 diabetes mellitus without complications: Secondary | ICD-10-CM | POA: Insufficient documentation

## 2015-07-03 DIAGNOSIS — J209 Acute bronchitis, unspecified: Secondary | ICD-10-CM | POA: Insufficient documentation

## 2015-07-03 DIAGNOSIS — I10 Essential (primary) hypertension: Secondary | ICD-10-CM | POA: Diagnosis not present

## 2015-07-03 DIAGNOSIS — R05 Cough: Secondary | ICD-10-CM | POA: Diagnosis present

## 2015-07-03 HISTORY — DX: Essential (primary) hypertension: I10

## 2015-07-03 HISTORY — DX: Type 2 diabetes mellitus without complications: E11.9

## 2015-07-03 MED ORDER — AZITHROMYCIN 250 MG PO TABS: ORAL_TABLET | ORAL | Status: DC

## 2015-07-03 MED ORDER — BENZONATATE 100 MG PO CAPS: ORAL_CAPSULE | ORAL | Status: DC

## 2015-07-03 NOTE — ED Notes (Signed)
PA at bedside.

## 2015-07-03 NOTE — ED Notes (Signed)
Pt very difficult to understand at this time, unclear exactly what the c/c is. Pt clearly has cough at this time, no acute distress noted.

## 2015-07-03 NOTE — ED Notes (Signed)
Patient reports cough since Thursday. Reports she has been at camp. States she has been taking cough medicine each day.

## 2015-07-03 NOTE — ED Provider Notes (Signed)
[facility] Emergency Department Provider Note  ____________________________________________  Time seen:  5:39 PM  I have reviewed the triage vital signs and the nursing notes.   HISTORY  Chief Complaint Cough   HPI Hannah Clarke is a 49 y.o. female is here with complaint of cough 1 week. She states she's been taking cough medicine which is not working. She is unaware whether or not she has had any fever or chills. She states that she was unable to talk last week which I am assuming is laryngitis. She is very difficult to understand at times.She is in no acute distress and is able to area on a conversation in complete sentences with nonproductive cough. She is unable to place a pain scale on her cough. She reports to the nurses that she has been at camp this week.   Past Medical History  Diagnosis Date  . Hypertension   . Diabetes mellitus without complication     There are no active problems to display for this patient.   Past Surgical History  Procedure Laterality Date  . Hand surgery    . Kidney stone surgery      Current Outpatient Rx  Name  Route  Sig  Dispense  Refill  . azithromycin (ZITHROMAX Z-PAK) 250 MG tablet      Take 2 tablets (500 mg) on  Day 1,  followed by 1 tablet (250 mg) once daily on Days 2 through 5.   6 each   0   . benzonatate (TESSALON PERLES) 100 MG capsule      Take 1 - 2 every 8 hours prn cough   30 capsule   0   . naproxen (EC NAPROSYN) 500 MG EC tablet   Oral   Take 1 tablet (500 mg total) by mouth 2 (two) times daily with a meal.   30 tablet   0     Allergies Review of patient's allergies indicates no known allergies.  No family history on file.  Social History History  Substance Use Topics  . Smoking status: Never Smoker   . Smokeless tobacco: Never Used  . Alcohol Use: No    Review of Systems Constitutional: Unknown fever/positive chills ENT: Positive sore throat. Cardiovascular: Denies  chest pain. Respiratory: Positive shortness of breath with coughing. Gastrointestinal: No abdominal pain.  No nausea, no vomiting.  Genitourinary: Negative for dysuria. Musculoskeletal: Negative for back pain. Skin: Negative for rash. Neurological: Negative for headaches, focal weakness or numbness.  10-point ROS otherwise negative.  ____________________________________________   PHYSICAL EXAM:  VITAL SIGNS: ED Triage Vitals  Enc Vitals Group     BP 07/03/15 1715 127/87 mmHg     Pulse Rate 07/03/15 1715 60     Resp 07/03/15 1715 18     Temp 07/03/15 1715 98.7 F (37.1 C)     Temp Source 07/03/15 1715 Oral     SpO2 07/03/15 1715 97 %     Weight 07/03/15 1715 175 lb (79.379 kg)     Height 07/03/15 1715 5\' 3"  (1.6 m)     Head Cir --      Peak Flow --      Pain Score --      Pain Loc --      Pain Edu? --      Excl. in [place]? --     Constitutional: Alert and oriented. Well appearing and in no acute distress. Eyes: Conjunctivae are normal. PERRL. EOMI. Head: Atraumatic. Nose: Positive congestion/rhinnorhea. Mouth/Throat:  Mucous membranes are moist.  Oropharynx non-erythematous. Neck: No stridor.  Supple Hematological/Lymphatic/Immunilogical: No cervical lymphadenopathy. Cardiovascular: Normal rate, regular rhythm. Grossly normal heart sounds.  Good peripheral circulation. Respiratory: Normal respiratory effort.  No retractions. Lungs CTAB with very congested cough. Gastrointestinal: Soft and nontender. No distention. Musculoskeletal: No lower extremity tenderness nor edema.  No joint effusions. Neurologic:  Normal speech and language. No gross focal neurologic deficits are appreciated. No gait instability. Skin:  Skin is warm, dry and intact. No rash noted. Psychiatric: Mood and affect are normal. Speech and behavior are normal.  ____________________________________________   LABS (all labs ordered are listed, but only abnormal results are displayed)  Labs Reviewed - No  data to display   RADIOLOGY  Chest x-ray per radiologist and reviewed by me showed no active cardiopulmonary disease. I, Johnn Hai, personally viewed and evaluated these images as part of my medical decision making.  ____________________________________________   PROCEDURES  Procedure(s) performed: None  Critical Care performed: No  ____________________________________________   INITIAL IMPRESSION / ASSESSMENT AND PLAN / ED COURSE  Pertinent labs & imaging results that were available during my care of the patient were reviewed by me and considered in my medical decision making (see chart for details).  Patient was placed on a Z-Pak and Tessalon Perles. Patient is to follow-up with her PCP next week if any continued problems she was told she may return to the emergency room if any severe worsening of her symptoms or urgent concerns. She is also to continue Tylenol or Motrin if needed for fever. She is encouraged to drink lots of fluids. ____________________________________________   FINAL CLINICAL IMPRESSION(S) / ED DIAGNOSES  Final diagnoses:  Acute bronchitis, unspecified organism      Johnn Hai, PA-C 07/03/15 1833  Delman Kitten, MD 07/05/15 2155

## 2015-07-03 NOTE — Discharge Instructions (Signed)
Acute Bronchitis Bronchitis is when the airways that extend from the windpipe into the lungs get red, puffy, and painful (inflamed). Bronchitis often causes thick spit (mucus) to develop. This leads to a cough. A cough is the most common symptom of bronchitis. In acute bronchitis, the condition usually begins suddenly and goes away over time (usually in 2 weeks). Smoking, allergies, and asthma can make bronchitis worse. Repeated episodes of bronchitis may cause more lung problems. HOME CARE  Rest.  Drink enough fluids to keep your pee (urine) clear or pale yellow (unless you need to limit fluids as told by your doctor).  Only take over-the-counter or prescription medicines as told by your doctor.  Avoid smoking and secondhand smoke. These can make bronchitis worse. If you are a smoker, think about using nicotine gum or skin patches. Quitting smoking will help your lungs heal faster.  Reduce the chance of getting bronchitis again by:  Washing your hands often.  Avoiding people with cold symptoms.  Trying not to touch your hands to your mouth, nose, or eyes.  Follow up with your doctor as told. GET HELP IF: Your symptoms do not improve after 1 week of treatment. Symptoms include:  Cough.  Fever.  Coughing up thick spit.  Body aches.  Chest congestion.  Chills.  Shortness of breath.  Sore throat. GET HELP RIGHT AWAY IF:   You have an increased fever.  You have chills.  You have severe shortness of breath.  You have bloody thick spit (sputum).  You throw up (vomit) often.  You lose too much body fluid (dehydration).  You have a severe headache.  You faint. MAKE SURE YOU:   Understand these instructions.  Will watch your condition.  Will get help right away if you are not doing well or get worse. Document Released: 05/08/2008 Document Revised: 07/23/2013 Document Reviewed: 05/13/2013 [employer] Patient Information 2015 [employer]. This information is not  intended to replace advice given to you by your health care provider. Make sure you discuss any questions you have with your health care provider.  

## 2015-07-10 DIAGNOSIS — J40 Bronchitis, not specified as acute or chronic: Secondary | ICD-10-CM | POA: Diagnosis not present

## 2015-07-10 DIAGNOSIS — R05 Cough: Secondary | ICD-10-CM | POA: Diagnosis present

## 2015-07-10 DIAGNOSIS — Z79899 Other long term (current) drug therapy: Secondary | ICD-10-CM | POA: Insufficient documentation

## 2015-07-10 DIAGNOSIS — E119 Type 2 diabetes mellitus without complications: Secondary | ICD-10-CM | POA: Diagnosis not present

## 2015-07-10 DIAGNOSIS — M940 Chondrocostal junction syndrome [Tietze]: Secondary | ICD-10-CM | POA: Insufficient documentation

## 2015-07-10 DIAGNOSIS — R51 Headache: Secondary | ICD-10-CM | POA: Diagnosis not present

## 2015-07-10 DIAGNOSIS — I10 Essential (primary) hypertension: Secondary | ICD-10-CM | POA: Diagnosis not present

## 2015-07-10 DIAGNOSIS — Z791 Long term (current) use of non-steroidal anti-inflammatories (NSAID): Secondary | ICD-10-CM | POA: Insufficient documentation

## 2015-07-10 NOTE — ED Notes (Signed)
Pt reports cough for a week; headache last night; pt talking in complete coherent sentences;

## 2015-07-11 ENCOUNTER — Emergency Department
Admission: EM | Admit: 2015-07-11 | Discharge: 2015-07-11 | Disposition: A | Discharge status: Home / Self Care | Payer: Medicare Other | Attending: Emergency Medicine | Admitting: Emergency Medicine

## 2015-07-11 ENCOUNTER — Emergency Department: Payer: Medicare Other

## 2015-07-11 ENCOUNTER — Encounter: Payer: Self-pay | Admitting: Emergency Medicine

## 2015-07-11 DIAGNOSIS — J40 Bronchitis, not specified as acute or chronic: Secondary | ICD-10-CM

## 2015-07-11 DIAGNOSIS — R519 Headache, unspecified: Secondary | ICD-10-CM

## 2015-07-11 DIAGNOSIS — R05 Cough: Secondary | ICD-10-CM

## 2015-07-11 DIAGNOSIS — R51 Headache: Secondary | ICD-10-CM

## 2015-07-11 DIAGNOSIS — M940 Chondrocostal junction syndrome [Tietze]: Secondary | ICD-10-CM

## 2015-07-11 DIAGNOSIS — R059 Cough, unspecified: Secondary | ICD-10-CM

## 2015-07-11 MED ORDER — BENZONATATE 100 MG PO CAPS
100.0000 mg | ORAL_CAPSULE | Freq: Once | ORAL | Status: AC
  Administered 2015-07-11: 100 mg via ORAL
  Filled 2015-07-11: qty 1

## 2015-07-11 MED ORDER — KETOROLAC TROMETHAMINE 60 MG/2ML IM SOLN
60.0000 mg | Freq: Once | INTRAMUSCULAR | Status: AC
  Administered 2015-07-11: 60 mg via INTRAMUSCULAR
  Filled 2015-07-11: qty 2

## 2015-07-11 MED ORDER — BUTALBITAL-APAP-CAFFEINE 50-325-40 MG PO TABS
1.0000 | ORAL_TABLET | Freq: Once | ORAL | Status: AC
  Administered 2015-07-11: 1 via ORAL
  Filled 2015-07-11: qty 1

## 2015-07-11 MED ORDER — HYDROCOD POLST-CPM POLST ER 10-8 MG/5ML PO SUER: 5.0000 mL | Freq: Two times a day (BID) | ORAL | Status: DC

## 2015-07-11 NOTE — Discharge Instructions (Signed)
Costochondritis Costochondritis, sometimes called Tietze syndrome, is a swelling and irritation (inflammation) of the tissue (cartilage) that connects your ribs with your breastbone (sternum). It causes pain in the chest and rib area. Costochondritis usually goes away on its own over time. It can take up to 6 weeks or longer to get better, especially if you are unable to limit your activities. CAUSES  Some cases of costochondritis have no known cause. Possible causes include:  Injury (trauma).  Exercise or activity such as lifting.  Severe coughing. SIGNS AND SYMPTOMS  Pain and tenderness in the chest and rib area.  Pain that gets worse when coughing or taking deep breaths.  Pain that gets worse with specific movements. DIAGNOSIS  Your health care provider will do a physical exam and ask about your symptoms. Chest X-rays or other tests may be done to rule out other problems. TREATMENT  Costochondritis usually goes away on its own over time. Your health care provider may prescribe medicine to help relieve pain. HOME CARE INSTRUCTIONS   Avoid exhausting physical activity. Try not to strain your ribs during normal activity. This would include any activities using chest, abdominal, and side muscles, especially if heavy weights are used.  Apply ice to the affected area for the first 2 days after the pain begins.  Put ice in a plastic bag.  Place a towel between your skin and the bag.  Leave the ice on for 20 minutes, 2-3 times a day.  Only take over-the-counter or prescription medicines as directed by your health care provider. SEEK MEDICAL CARE IF:  You have redness or swelling at the rib joints. These are signs of infection.  Your pain does not go away despite rest or medicine. SEEK IMMEDIATE MEDICAL CARE IF:   Your pain increases or you are very uncomfortable.  You have shortness of breath or difficulty breathing.  You cough up blood.  You have worse chest pains,  sweating, or vomiting.  You have a fever or persistent symptoms for more than 2-3 days.  You have a fever and your symptoms suddenly get worse. MAKE SURE YOU:   Understand these instructions.  Will watch your condition.  Will get help right away if you are not doing well or get worse. Document Released: 08/30/2005 Document Revised: 09/10/2013 Document Reviewed: 06/24/2013 [facility] Patient Information 2015 [place], [place]. This information is not intended to replace advice given to you by your health care provider. Make sure you discuss any questions you have with your health care provider.  Cough, Adult  A cough is a reflex that helps clear your throat and airways. It can help heal the body or may be a reaction to an irritated airway. A cough may only last 2 or 3 weeks (acute) or may last more than 8 weeks (chronic).  CAUSES Acute cough:  Viral or bacterial infections. Chronic cough:  Infections.  Allergies.  Asthma.  Post-nasal drip.  Smoking.  Heartburn or acid reflux.  Some medicines.  Chronic lung problems (COPD).  Cancer. SYMPTOMS   Cough.  Fever.  Chest pain.  Increased breathing rate.  High-pitched whistling sound when breathing (wheezing).  Colored mucus that you cough up (sputum). TREATMENT   A bacterial cough may be treated with antibiotic medicine.  A viral cough must run its course and will not respond to antibiotics.  Your caregiver may recommend other treatments if you have a chronic cough. HOME CARE INSTRUCTIONS   Only take over-the-counter or prescription medicines for pain, discomfort, or fever  as directed by your caregiver. Use cough suppressants only as directed by your caregiver.  Use a cold steam vaporizer or humidifier in your bedroom or home to help loosen secretions.  Sleep in a semi-upright position if your cough is worse at night.  Rest as needed.  Stop smoking if you smoke. SEEK IMMEDIATE MEDICAL CARE IF:   You have  pus in your sputum.  Your cough starts to worsen.  You cannot control your cough with suppressants and are losing sleep.  You begin coughing up blood.  You have difficulty breathing.  You develop pain which is getting worse or is uncontrolled with medicine.  You have a fever. MAKE SURE YOU:   Understand these instructions.  Will watch your condition.  Will get help right away if you are not doing well or get worse. Document Released: 05/19/2011 Document Revised: 02/12/2012 Document Reviewed: 05/19/2011 [facility] Patient Information 2015 [place], [place]. This information is not intended to replace advice given to you by your health care provider. Make sure you discuss any questions you have with your health care provider.  Headaches, Frequently Asked Questions MIGRAINE HEADACHES Q: What is migraine? What causes it? How can I treat it? A: Generally, migraine headaches begin as a dull ache. Then they develop into a constant, throbbing, and pulsating pain. You may experience pain at the temples. You may experience pain at the front or back of one or both sides of the head. The pain is usually accompanied by a combination of:  Nausea.  Vomiting.  Sensitivity to light and noise. Some people (about 15%) experience an aura (see below) before an attack. The cause of migraine is believed to be chemical reactions in the brain. Treatment for migraine may include over-the-counter or prescription medications. It may also include self-help techniques. These include relaxation training and biofeedback.  Q: What is an aura? A: About 15% of people with migraine get an "aura". This is a sign of neurological symptoms that occur before a migraine headache. You may see wavy or jagged lines, dots, or flashing lights. You might experience tunnel vision or blind spots in one or both eyes. The aura can include visual or auditory hallucinations (something imagined). It may include disruptions in smell (such  as strange odors), taste or touch. Other symptoms include:  Numbness.  A "pins and needles" sensation.  Difficulty in recalling or speaking the correct word. These neurological events may last as long as 60 minutes. These symptoms will fade as the headache begins. Q: What is a trigger? A: Certain physical or environmental factors can lead to or "trigger" a migraine. These include:  Foods.  Hormonal changes.  Weather.  Stress. It is important to remember that triggers are different for everyone. To help prevent migraine attacks, you need to figure out which triggers affect you. Keep a headache diary. This is a good way to track triggers. The diary will help you talk to your healthcare professional about your condition. Q: Does weather affect migraines? A: Bright sunshine, hot, humid conditions, and drastic changes in barometric pressure may lead to, or "trigger," a migraine attack in some people. But studies have shown that weather does not act as a trigger for everyone with migraines. Q: What is the link between migraine and hormones? A: Hormones start and regulate many of your body's functions. Hormones keep your body in balance within a constantly changing environment. The levels of hormones in your body are unbalanced at times. Examples are during menstruation, pregnancy, or menopause.  That can lead to a migraine attack. In fact, about three quarters of all women with migraine report that their attacks are related to the menstrual cycle.  Q: Is there an increased risk of stroke for migraine sufferers? A: The likelihood of a migraine attack causing a stroke is very remote. That is not to say that migraine sufferers cannot have a stroke associated with their migraines. In persons under age 33, the most common associated factor for stroke is migraine headache. But over the course of a person's normal life span, the occurrence of migraine headache may actually be associated with a reduced risk  of dying from cerebrovascular disease due to stroke.  Q: What are acute medications for migraine? A: Acute medications are used to treat the pain of the headache after it has started. Examples over-the-counter medications, NSAIDs, ergots, and triptans.  Q: What are the triptans? A: Triptans are the newest class of abortive medications. They are specifically targeted to treat migraine. Triptans are vasoconstrictors. They moderate some chemical reactions in the brain. The triptans work on receptors in your brain. Triptans help to restore the balance of a neurotransmitter called serotonin. Fluctuations in levels of serotonin are thought to be a main cause of migraine.  Q: Are over-the-counter medications for migraine effective? A: Over-the-counter, or "OTC," medications may be effective in relieving mild to moderate pain and associated symptoms of migraine. But you should see your caregiver before beginning any treatment regimen for migraine.  Q: What are preventive medications for migraine? A: Preventive medications for migraine are sometimes referred to as "prophylactic" treatments. They are used to reduce the frequency, severity, and length of migraine attacks. Examples of preventive medications include antiepileptic medications, antidepressants, beta-blockers, calcium channel blockers, and NSAIDs (nonsteroidal anti-inflammatory drugs). Q: Why are anticonvulsants used to treat migraine? A: During the past few years, there has been an increased interest in antiepileptic drugs for the prevention of migraine. They are sometimes referred to as "anticonvulsants". Both epilepsy and migraine may be caused by similar reactions in the brain.  Q: Why are antidepressants used to treat migraine? A: Antidepressants are typically used to treat people with depression. They may reduce migraine frequency by regulating chemical levels, such as serotonin, in the brain.  Q: What alternative therapies are used to treat  migraine? A: The term "alternative therapies" is often used to describe treatments considered outside the scope of conventional Western medicine. Examples of alternative therapy include acupuncture, acupressure, and yoga. Another common alternative treatment is herbal therapy. Some herbs are believed to relieve headache pain. Always discuss alternative therapies with your caregiver before proceeding. Some herbal products contain arsenic and other toxins. TENSION HEADACHES Q: What is a tension-type headache? What causes it? How can I treat it? A: Tension-type headaches occur randomly. They are often the result of temporary stress, anxiety, fatigue, or anger. Symptoms include soreness in your temples, a tightening band-like sensation around your head (a "vice-like" ache). Symptoms can also include a pulling feeling, pressure sensations, and contracting head and neck muscles. The headache begins in your forehead, temples, or the back of your head and neck. Treatment for tension-type headache may include over-the-counter or prescription medications. Treatment may also include self-help techniques such as relaxation training and biofeedback. CLUSTER HEADACHES Q: What is a cluster headache? What causes it? How can I treat it? A: Cluster headache gets its name because the attacks come in groups. The pain arrives with little, if any, warning. It is usually on one side of the  head. A tearing or bloodshot eye and a runny nose on the same side of the headache may also accompany the pain. Cluster headaches are believed to be caused by chemical reactions in the brain. They have been described as the most severe and intense of any headache type. Treatment for cluster headache includes prescription medication and oxygen. SINUS HEADACHES Q: What is a sinus headache? What causes it? How can I treat it? A: When a cavity in the bones of the face and skull (a sinus) becomes inflamed, the inflammation will cause localized  pain. This condition is usually the result of an allergic reaction, a tumor, or an infection. If your headache is caused by a sinus blockage, such as an infection, you will probably have a fever. An x-ray will confirm a sinus blockage. Your caregiver's treatment might include antibiotics for the infection, as well as antihistamines or decongestants.  REBOUND HEADACHES Q: What is a rebound headache? What causes it? How can I treat it? A: A pattern of taking acute headache medications too often can lead to a condition known as "rebound headache." A pattern of taking too much headache medication includes taking it more than 2 days per week or in excessive amounts. That means more than the label or a caregiver advises. With rebound headaches, your medications not only stop relieving pain, they actually begin to cause headaches. Doctors treat rebound headache by tapering the medication that is being overused. Sometimes your caregiver will gradually substitute a different type of treatment or medication. Stopping may be a challenge. Regularly overusing a medication increases the potential for serious side effects. Consult a caregiver if you regularly use headache medications more than 2 days per week or more than the label advises. ADDITIONAL QUESTIONS AND ANSWERS Q: What is biofeedback? A: Biofeedback is a self-help treatment. Biofeedback uses special equipment to monitor your body's involuntary physical responses. Biofeedback monitors:  Breathing.  Pulse.  Heart rate.  Temperature.  Muscle tension.  Brain activity. Biofeedback helps you refine and perfect your relaxation exercises. You learn to control the physical responses that are related to stress. Once the technique has been mastered, you do not need the equipment any more. Q: Are headaches hereditary? A: Four out of five (80%) of people that suffer report a family history of migraine. Scientists are not sure if this is genetic or a family  predisposition. Despite the uncertainty, a child has a 50% chance of having migraine if one parent suffers. The child has a 75% chance if both parents suffer.  Q: Can children get headaches? A: By the time they reach high school, most young people have experienced some type of headache. Many safe and effective approaches or medications can prevent a headache from occurring or stop it after it has begun.  Q: What type of doctor should I see to diagnose and treat my headache? A: Start with your primary caregiver. Discuss his or her experience and approach to headaches. Discuss methods of classification, diagnosis, and treatment. Your caregiver may decide to recommend you to a headache specialist, depending upon your symptoms or other physical conditions. Having diabetes, allergies, etc., may require a more comprehensive and inclusive approach to your headache. The National Headache Foundation will provide, upon request, a list of [facility] physician members in your state. Document Released: 02/10/2004 Document Revised: 02/12/2012 Document Reviewed: 07/20/2008 [facility] Patient Information 2015 [place], [place]. This information is not intended to replace advice given to you by your health care provider. Make sure you discuss  any questions you have with your health care provider. ° °

## 2015-07-11 NOTE — ED Notes (Signed)
Pt reports cough x 1 week.  Pt reports taking expectorant at home.  Pt reports fever, but was afebrile in triage.  Pt reports HA and states she hasn't been drinking much water.  Pt reports n/v w/ 2 episodes.  Pt reports sore throat and sneezing. Pt NAD at this time.

## 2015-07-11 NOTE — ED Provider Notes (Signed)
[facility] Emergency Department Provider Note  ____________________________________________  Time seen: Approximately 221 AM  I have reviewed the triage vital signs and the nursing notes.   HISTORY  Chief Complaint Cough    HPI Hannah Clarke is a 49 y.o. female who reports that she's been sick for a couple of weeks. The patient reports that she went to And someone was sick at camp and she came home and was also sick. The patient was seen in the Emergency department and diagnosed with bronchitis. She was given cough medicine and azithromycin. The patient saw her primary care physician and was again diagnosed with bronchitis and given augmentin and mucinex. The patient reports that she still feels sick and wants to know why she is not better. She has taken 4 days of antibiotic. She reports that her temperature was 99.8. She has been taking ibuprofen for pain and reports her pain is a 6 out of 10 in intensity. She was also given albuterol by her primary care physician.   Past Medical History  Diagnosis Date  . Hypertension   . Diabetes mellitus without complication     There are no active problems to display for this patient.   Past Surgical History  Procedure Laterality Date  . Hand surgery    . Kidney stone surgery      Current Outpatient Rx  Name  Route  Sig  Dispense  Refill  . azithromycin (ZITHROMAX Z-PAK) 250 MG tablet      Take 2 tablets (500 mg) on  Day 1,  followed by 1 tablet (250 mg) once daily on Days 2 through 5.   6 each   0   . benzonatate (TESSALON PERLES) 100 MG capsule      Take 1 - 2 every 8 hours prn cough   30 capsule   0   . naproxen (EC NAPROSYN) 500 MG EC tablet   Oral   Take 1 tablet (500 mg total) by mouth 2 (two) times daily with a meal.   30 tablet   0   . chlorpheniramine-HYDROcodone (TUSSIONEX PENNKINETIC ER) 10-8 MG/5ML SUER   Oral   Take 5 mLs by mouth 2 (two) times daily.   100 mL   0      Allergies Review of patient's allergies indicates no known allergies.  History reviewed. No pertinent family history.  Social History History  Substance Use Topics  . Smoking status: Never Smoker   . Smokeless tobacco: Never Used  . Alcohol Use: No    Review of Systems Constitutional: fever/chills Eyes: No visual changes. ENT: sore throat. Cardiovascular: chest pain. Respiratory: Denies shortness of breath. Gastrointestinal: No abdominal pain.  No nausea, no vomiting.  No diarrhea.  No constipation. Genitourinary: Negative for dysuria. Musculoskeletal: Negative for back pain. Skin: Negative for rash. Neurological: Headache  10-point ROS otherwise negative.  ____________________________________________   PHYSICAL EXAM:  VITAL SIGNS: ED Triage Vitals  Enc Vitals Group     BP 07/10/15 2301 136/83 mmHg     Pulse Rate 07/10/15 2301 62     Resp 07/10/15 2301 20     Temp 07/10/15 2301 98.9 F (37.2 C)     Temp Source 07/10/15 2301 Oral     SpO2 07/10/15 2301 99 %     Weight 07/10/15 2301 175 lb (79.379 kg)     Height 07/10/15 2301 5\' 2"  (1.575 m)     Head Cir --      Peak Flow --  Pain Score 07/10/15 2303 8     Pain Loc --      Pain Edu? --      Excl. in [place]? --     Constitutional: Alert and oriented. Well appearing and in mild distress. Eyes: Conjunctivae are normal. PERRL. EOMI. Head: Atraumatic. Nose: No congestion/rhinnorhea. Mouth/Throat: Mucous membranes are moist.  Oropharynx non-erythematous. Cardiovascular: Normal rate, regular rhythm. Grossly normal heart sounds.  Good peripheral circulation. Respiratory: Normal respiratory effort.  No retractions. Lungs CTAB. Chest tender to palpation Gastrointestinal: Soft and nontender. No distention. No abdominal bruits. No CVA tenderness. Genitourinary: Deferred Musculoskeletal: No lower extremity tenderness nor edema.  No joint effusions. Neurologic:  Normal speech and language. No gross focal neurologic  deficits are appreciated. No gait instability. Skin:  Skin is warm, dry and intact. No rash noted. Psychiatric: Mood and affect are normal.   ____________________________________________   LABS (all labs ordered are listed, but only abnormal results are displayed)  Labs Reviewed - No data to display ____________________________________________  EKG  None ____________________________________________  RADIOLOGY  Chest x-ray: Mild bronchitic changes without focal consolidation ____________________________________________   PROCEDURES  Procedure(s) performed: None  Critical Care performed: No  ____________________________________________   INITIAL IMPRESSION / ASSESSMENT AND PLAN / ED COURSE  Pertinent labs & imaging results that were available during my care of the patient were reviewed by me and considered in my medical decision making (see chart for details).  This is a 49 year old female who comes in today with continued cough over the last 2 weeks. The patient is currently taking antibiotics and Mucinex. I did inform the patient that she would not immediately feel better that it would take some time for her to feel improved. I did give the patient a dose of Toradol and a dose of Fioricet for her headache. The patient reports that she does feel some improvement. I also gave her a dose of benzonatate. The patient will be discharged home to follow back up with her primary care physician. ____________________________________________   FINAL CLINICAL IMPRESSION(S) / ED DIAGNOSES  Final diagnoses:  Cough  Costochondritis, acute  Bronchitis  Headache, unspecified headache type      Loney Hering, MD 07/11/15 [id number]

## 2015-09-01 ENCOUNTER — Encounter: Payer: Self-pay | Admitting: Emergency Medicine

## 2015-09-01 ENCOUNTER — Emergency Department: Payer: Medicare Other

## 2015-09-01 DIAGNOSIS — R079 Chest pain, unspecified: Secondary | ICD-10-CM | POA: Diagnosis present

## 2015-09-01 DIAGNOSIS — Z791 Long term (current) use of non-steroidal anti-inflammatories (NSAID): Secondary | ICD-10-CM | POA: Diagnosis not present

## 2015-09-01 DIAGNOSIS — F419 Anxiety disorder, unspecified: Secondary | ICD-10-CM | POA: Diagnosis not present

## 2015-09-01 DIAGNOSIS — Z79899 Other long term (current) drug therapy: Secondary | ICD-10-CM | POA: Insufficient documentation

## 2015-09-01 DIAGNOSIS — F430 Acute stress reaction: Secondary | ICD-10-CM | POA: Diagnosis not present

## 2015-09-01 DIAGNOSIS — I10 Essential (primary) hypertension: Secondary | ICD-10-CM | POA: Diagnosis not present

## 2015-09-01 DIAGNOSIS — E119 Type 2 diabetes mellitus without complications: Secondary | ICD-10-CM | POA: Insufficient documentation

## 2015-09-01 DIAGNOSIS — Z792 Long term (current) use of antibiotics: Secondary | ICD-10-CM | POA: Diagnosis not present

## 2015-09-01 LAB — BASIC METABOLIC PANEL
ANION GAP: 9 (ref 5–15)
BUN: 18 mg/dL (ref 6–20)
CALCIUM: 9.3 mg/dL (ref 8.9–10.3)
CO2: 25 mmol/L (ref 22–32)
Chloride: 109 mmol/L (ref 101–111)
Creatinine, Ser: 0.52 mg/dL (ref 0.44–1.00)
GFR calc Af Amer: >60 mL/min (ref >60)
GFR calc non Af Amer: >60 mL/min (ref >60)
GLUCOSE: 148 mg/dL — AB (ref 65–99)
POTASSIUM: 3.4 mmol/L — AB (ref 3.5–5.1)
Sodium: 143 mmol/L (ref 135–145)

## 2015-09-01 LAB — CBC
HEMATOCRIT: 38.5 % (ref 35.0–47.0)
HEMOGLOBIN: 13.2 g/dL (ref 12.0–16.0)
MCH: 30.2 pg (ref 26.0–34.0)
MCHC: 34.3 g/dL (ref 32.0–36.0)
MCV: 87.9 fL (ref 80.0–100.0)
Platelets: 282 10*3/uL (ref 150–440)
RBC: 4.38 MIL/uL (ref 3.80–5.20)
RDW: 13.7 % (ref 11.5–14.5)
WBC: 7.9 10*3/uL (ref 3.6–11.0)

## 2015-09-01 LAB — TROPONIN I: Troponin I: <0.03 ng/mL (ref <0.031)

## 2015-09-01 NOTE — ED Notes (Signed)
Patient ambulatory to triage with steady gait, without difficulty or distress noted; pt reports mid CP this week; st "been upset a whole lot"; st hx of same; denies accomp symptoms

## 2015-09-02 ENCOUNTER — Emergency Department
Admission: EM | Admit: 2015-09-02 | Discharge: 2015-09-02 | Disposition: A | Discharge status: Home / Self Care | Payer: Medicare Other | Attending: Emergency Medicine | Admitting: Emergency Medicine

## 2015-09-02 DIAGNOSIS — R079 Chest pain, unspecified: Secondary | ICD-10-CM | POA: Diagnosis not present

## 2015-09-02 DIAGNOSIS — F419 Anxiety disorder, unspecified: Secondary | ICD-10-CM

## 2015-09-02 DIAGNOSIS — F439 Reaction to severe stress, unspecified: Secondary | ICD-10-CM

## 2015-09-02 LAB — TROPONIN I: Troponin I: <0.03 ng/mL (ref <0.031)

## 2015-09-02 MED ORDER — LORAZEPAM 1 MG PO TABS
1.0000 mg | ORAL_TABLET | Freq: Once | ORAL | Status: AC
  Administered 2015-09-02: 1 mg via ORAL
  Filled 2015-09-02: qty 1

## 2015-09-02 NOTE — ED Provider Notes (Signed)
[facility] Emergency Department Provider Note  ____________________________________________  Time seen: Approximately 12:41 AM  I have reviewed the triage vital signs and the nursing notes.   HISTORY  Chief Complaint Chest Pain    HPI Hannah Clarke is a 49 y.o. female who presents to the ED from a [facility] with a chief complaint of chest pain. Reports central chest tightness waxing and waning 4 days, associated with emotional upset. States she has been arguing with her mother lately, and her chest becomes tight when she feels upset or stressed. Symptoms not associated with diaphoresis, shortness of breath or nausea. Patient does not have a history of coronary artery disease. Denies recent travel or hormone use. Denies recent fever, chills, cough, abdominal pain, nausea, vomiting, diarrhea. Nothing makes the pain better. Arguing makes the pain worse. States she spoke with her therapist this afternoon and declines offer for behavioral medicine consult. Denies active SI/HI/AH/VH.  Past Medical History  Diagnosis Date  . Hypertension   . Diabetes mellitus without complication   Depression  There are no active problems to display for this patient.   Past Surgical History  Procedure Laterality Date  . Hand surgery    . Kidney stone surgery      Current Outpatient Rx  Name  Route  Sig  Dispense  Refill  . azithromycin (ZITHROMAX Z-PAK) 250 MG tablet      Take 2 tablets (500 mg) on  Day 1,  followed by 1 tablet (250 mg) once daily on Days 2 through 5.   6 each   0   . benzonatate (TESSALON PERLES) 100 MG capsule      Take 1 - 2 every 8 hours prn cough   30 capsule   0   . chlorpheniramine-HYDROcodone (TUSSIONEX PENNKINETIC ER) 10-8 MG/5ML SUER   Oral   Take 5 mLs by mouth 2 (two) times daily.   100 mL   0   . naproxen (EC NAPROSYN) 500 MG EC tablet   Oral   Take 1 tablet (500 mg total) by mouth 2 (two) times daily with a meal.    30 tablet   0     Allergies Review of patient's allergies indicates no known allergies.  No family history on file.  Social History Social History  Substance Use Topics  . Smoking status: Never Smoker   . Smokeless tobacco: Never Used  . Alcohol Use: No    Review of Systems Constitutional: No fever/chills Eyes: No visual changes. ENT: No sore throat. Cardiovascular: Positive for chest pain. Respiratory: Denies shortness of breath. Gastrointestinal: No abdominal pain.  No nausea, no vomiting.  No diarrhea.  No constipation. Genitourinary: Negative for dysuria. Musculoskeletal: Negative for back pain. Skin: Negative for rash. Neurological: Negative for headaches, focal weakness or numbness. Psychiatric:Positive for emotional upset and anxiety.  10-point ROS otherwise negative.  ____________________________________________   PHYSICAL EXAM:  VITAL SIGNS: ED Triage Vitals  Enc Vitals Group     BP 09/01/15 2207 145/99 mmHg     Pulse Rate 09/01/15 2207 72     Resp 09/01/15 2207 18     Temp 09/01/15 2207 98.6 F (37 C)     Temp Source 09/01/15 2207 Oral     SpO2 09/01/15 2207 98 %     Weight 09/01/15 2207 170 lb (77.111 kg)     Height 09/01/15 2207 5\' 2"  (1.575 m)     Head Cir --      Peak  Flow --      Pain Score 09/01/15 2207 9     Pain Loc --      Pain Edu? --      Excl. in [place]? --     Constitutional: Alert and oriented. Well appearing and mildly anxious. Eyes: Conjunctivae are normal. PERRL. EOMI. Head: Atraumatic. Nose: No congestion/rhinnorhea. Mouth/Throat: Mucous membranes are moist.  Oropharynx non-erythematous. Neck: No stridor. No carotid bruits. Cardiovascular: Normal rate, regular rhythm. Grossly normal heart sounds.  Good peripheral circulation. Respiratory: Normal respiratory effort.  No retractions. Lungs CTAB. Gastrointestinal: Soft and nontender. No distention. No abdominal bruits. No CVA tenderness. Musculoskeletal: No lower extremity  tenderness nor edema.  No joint effusions. Neurologic:  Normal speech and language. No gross focal neurologic deficits are appreciated. No gait instability. Skin:  Skin is warm, dry and intact. No rash noted. Psychiatric: Mood and affect are flat. Speech and behavior are flat.  ____________________________________________   LABS (all labs ordered are listed, but only abnormal results are displayed)  Labs Reviewed  BASIC METABOLIC PANEL - Abnormal; Notable for the following:    Potassium 3.4 (*)    Glucose, Bld 148 (*)    All other components within normal limits  CBC  TROPONIN I  TROPONIN I   ____________________________________________  EKG  ED ECG REPORT I, Teven Mittman J, the attending physician, personally viewed and interpreted this ECG.   Date: 09/02/2015  EKG Time: 2208  Rate: 70  Rhythm: normal EKG, normal sinus rhythm  Axis: Normal  Intervals:none  ST&T Change: Nonspecific  ____________________________________________  RADIOLOGY  Chest 2 view (viewed by me, interpreted per Dr. Augustin Coupe): No active cardiopulmonary disease. ____________________________________________   PROCEDURES  Procedure(s) performed: None  Critical Care performed: No  ____________________________________________   INITIAL IMPRESSION / ASSESSMENT AND PLAN / ED COURSE  Pertinent labs & imaging results that were available during my care of the patient were reviewed by me and considered in my medical decision making (see chart for details).  49 year old female from group home who presents with chest tightness associated with emotional upset. First troponin negative. Will repeat 3 hour troponin, administer anxiolytic and reassess.  ----------------------------------------- 2:49 AM on 09/02/2015 -----------------------------------------  Repeat troponin negative. Patient improved. Strict return precautions given. Patient verbalizes understanding and agrees with plan of  care. ____________________________________________   FINAL CLINICAL IMPRESSION(S) / ED DIAGNOSES  Final diagnoses:  Chest pain, unspecified chest pain type  Anxiety  Stress      Hannah Blanch, MD 09/02/15 [phone]

## 2015-09-02 NOTE — Discharge Instructions (Signed)
1. Continue all medications as directed by your doctor. 2. Return to the ER for worsening symptoms, persistent vomiting, difficulty breathing, feelings of hurting yourself or others, or other concerns.  Chest Pain (Nonspecific) It is often hard to give a specific diagnosis for the cause of chest pain. There is always a chance that your pain could be related to something serious, such as a heart attack or a blood clot in the lungs. You need to follow up with your health care provider for further evaluation. CAUSES   Heartburn.  Pneumonia or bronchitis.  Anxiety or stress.  Inflammation around your heart (pericarditis) or lung (pleuritis or pleurisy).  A blood clot in the lung.  A collapsed lung (pneumothorax). It can develop suddenly on its own (spontaneous pneumothorax) or from trauma to the chest.  Shingles infection (herpes zoster virus). The chest wall is composed of bones, muscles, and cartilage. Any of these can be the source of the pain.  The bones can be bruised by injury.  The muscles or cartilage can be strained by coughing or overwork.  The cartilage can be affected by inflammation and become sore (costochondritis). DIAGNOSIS  Lab tests or other studies may be needed to find the cause of your pain. Your health care provider may have you take a test called an ambulatory electrocardiogram (ECG). An ECG records your heartbeat patterns over a 24-hour period. You may also have other tests, such as:  Transthoracic echocardiogram (TTE). During echocardiography, sound waves are used to evaluate how blood flows through your heart.  Transesophageal echocardiogram (TEE).  Cardiac monitoring. This allows your health care provider to monitor your heart rate and rhythm in real time.  Holter monitor. This is a portable device that records your heartbeat and can help diagnose heart arrhythmias. It allows your health care provider to track your heart activity for several days, if  needed.  Stress tests by exercise or by giving medicine that makes the heart beat faster. TREATMENT   Treatment depends on what may be causing your chest pain. Treatment may include:  Acid blockers for heartburn.  Anti-inflammatory medicine.  Pain medicine for inflammatory conditions.  Antibiotics if an infection is present.  You may be advised to change lifestyle habits. This includes stopping smoking and avoiding alcohol, caffeine, and chocolate.  You may be advised to keep your head raised (elevated) when sleeping. This reduces the chance of acid going backward from your stomach into your esophagus. Most of the time, nonspecific chest pain will improve within 2-3 days with rest and mild pain medicine.  HOME CARE INSTRUCTIONS   If antibiotics were prescribed, take them as directed. Finish them even if you start to feel better.  For the next few days, avoid physical activities that bring on chest pain. Continue physical activities as directed.  Do not use any tobacco products, including cigarettes, chewing tobacco, or electronic cigarettes.  Avoid drinking alcohol.  Only take medicine as directed by your health care provider.  Follow your health care provider's suggestions for further testing if your chest pain does not go away.  Keep any follow-up appointments you made. If you do not go to an appointment, you could develop lasting (chronic) problems with pain. If there is any problem keeping an appointment, call to reschedule. SEEK MEDICAL CARE IF:   Your chest pain does not go away, even after treatment.  You have a rash with blisters on your chest.  You have a fever. SEEK IMMEDIATE MEDICAL CARE IF:   You  have increased chest pain or pain that spreads to your arm, neck, jaw, back, or abdomen.  You have shortness of breath.  You have an increasing cough, or you cough up blood.  You have severe back or abdominal pain.  You feel nauseous or vomit.  You have severe  weakness.  You faint.  You have chills. This is an emergency. Do not wait to see if the pain will go away. Get medical help at once. Call your local emergency services (911 in [place] Do not drive yourself to the hospital. MAKE SURE YOU:   Understand these instructions.  Will watch your condition.  Will get help right away if you are not doing well or get worse. Document Released: 08/30/2005 Document Revised: 11/25/2013 Document Reviewed: 06/25/2008 [facility] Patient Information 2015 [place], [place]. This information is not intended to replace advice given to you by your health care provider. Make sure you discuss any questions you have with your health care provider.  Stress Stress-related medical problems are becoming increasingly common. The body has a built-in physical response to stressful situations. Faced with pressure, challenge or danger, we need to react quickly. Our bodies release hormones such as cortisol and adrenaline to help do this. These hormones are part of the "fight or flight" response and affect the metabolic rate, heart rate and blood pressure, resulting in a heightened, stressed state that prepares the body for optimum performance in dealing with a stressful situation. It is likely that early man required these mechanisms to stay alive, but usually modern stresses do not call for this, and the same hormones released in today's world can damage health and reduce coping ability. CAUSES  Pressure to perform at work, at school or in sports.  Threats of physical violence.  Money worries.  Arguments.  Family conflicts.  Divorce or separation from significant other.  Bereavement.  New job or unemployment.  Changes in location.  Alcohol or drug abuse. SOMETIMES, THERE IS NO PARTICULAR REASON FOR DEVELOPING STRESS. Almost all people are at risk of being stressed at some time in their lives. It is important to know that some stress is temporary and some is long  term.  Temporary stress will go away when a situation is resolved. Most people can cope with short periods of stress, and it can often be relieved by relaxing, taking a walk or getting any type of exercise, chatting through issues with friends, or having a good night's sleep.  Chronic (long-term, continuous) stress is much harder to deal with. It can be psychologically and emotionally damaging. It can be harmful both for an individual and for friends and family. SYMPTOMS Everyone reacts to stress differently. There are some common effects that help [place] recognize it. In times of extreme stress, people may:  Shake uncontrollably.  Breathe faster and deeper than normal (hyperventilate).  Vomit.  For people with asthma, stress can trigger an attack.  For some people, stress may trigger migraine headaches, ulcers, and body pain. PHYSICAL EFFECTS OF STRESS MAY INCLUDE:  Loss of energy.  Skin problems.  Aches and pains resulting from tense muscles, including neck ache, backache and tension headaches.  Increased pain from arthritis and other conditions.  Irregular heart beat (palpitations).  Periods of irritability or anger.  Apathy or depression.  Anxiety (feeling uptight or worrying).  Unusual behavior.  Loss of appetite.  Comfort eating.  Lack of concentration.  Loss of, or decreased, sex-drive.  Increased smoking, drinking, or recreational drug use.  For women, missed  periods.  Ulcers, joint pain, and muscle pain. Post-traumatic stress is the stress caused by any serious accident, strong emotional damage, or extremely difficult or violent experience such as rape or war. Post-traumatic stress victims can experience mixtures of emotions such as fear, shame, depression, guilt or anger. It may include recurrent memories or images that may be haunting. These feelings can last for weeks, months or even years after the traumatic event that triggered them. Specialized treatment,  possibly with medicines and psychological therapies, is available. If stress is causing physical symptoms, severe distress or making it difficult for you to function as normal, it is worth seeing your caregiver. It is important to remember that although stress is a usual part of life, extreme or prolonged stress can lead to other illnesses that will need treatment. It is better to visit a doctor sooner rather than later. Stress has been linked to the development of high blood pressure and heart disease, as well as insomnia and depression. There is no diagnostic test for stress since everyone reacts to it differently. But a caregiver will be able to spot the physical symptoms, such as:  Headaches.  Shingles.  Ulcers. Emotional distress such as intense worry, low mood or irritability should be detected when the doctor asks pertinent questions to identify any underlying problems that might be the cause. In case there are physical reasons for the symptoms, the doctor may also want to do some tests to exclude certain conditions. If you feel that you are suffering from stress, try to identify the aspects of your life that are causing it. Sometimes you may not be able to change or avoid them, but even a small change can have a positive ripple effect. A simple lifestyle change can make all the difference. STRATEGIES THAT CAN HELP DEAL WITH STRESS:  Delegating or sharing responsibilities.  Avoiding confrontations.  Learning to be more assertive.  Regular exercise.  Avoid using alcohol or street drugs to cope.  Eating a healthy, balanced diet, rich in fruit and vegetables and proteins.  Finding humor or absurdity in stressful situations.  Never taking on more than you know you can handle comfortably.  Organizing your time better to get as much done as possible.  Talking to friends or family and sharing your thoughts and fears.  Listening to music or relaxation tapes.  Relaxation techniques  like deep breathing, meditation, and yoga.  Tensing and then relaxing your muscles, starting at the toes and working up to the head and neck. If you think that you would benefit from help, either in identifying the things that are causing your stress or in learning techniques to help you relax, see a caregiver who is capable of helping you with this. Rather than relying on medications, it is usually better to try and identify the things in your life that are causing stress and try to deal with them. There are many techniques of managing stress including counseling, psychotherapy, aromatherapy, yoga, and exercise. Your caregiver can help you determine what is best for you. Document Released: 02/10/2003 Document Revised: 11/25/2013 Document Reviewed: 01/07/2008 [facility] Patient Information 2015 [place], [place]. This information is not intended to replace advice given to you by your health care provider. Make sure you discuss any questions you have with your health care provider.

## 2015-09-06 ENCOUNTER — Other Ambulatory Visit: Payer: Self-pay | Admitting: Nurse Practitioner

## 2015-09-06 DIAGNOSIS — Z1231 Encounter for screening mammogram for malignant neoplasm of breast: Secondary | ICD-10-CM

## 2015-09-14 ENCOUNTER — Ambulatory Visit
Admission: RE | Admit: 2015-09-14 | Discharge: 2015-09-14 | Disposition: A | Discharge status: Home / Self Care | Payer: Medicare Other | Source: Ambulatory Visit | Attending: Nurse Practitioner | Admitting: Nurse Practitioner

## 2015-09-14 DIAGNOSIS — Z1231 Encounter for screening mammogram for malignant neoplasm of breast: Secondary | ICD-10-CM | POA: Diagnosis not present

## 2015-10-16 ENCOUNTER — Emergency Department
Admission: EM | Admit: 2015-10-16 | Discharge: 2015-10-17 | Disposition: A | Discharge status: Home / Self Care | Payer: Medicare Other | Attending: Emergency Medicine | Admitting: Emergency Medicine

## 2015-10-16 ENCOUNTER — Encounter: Payer: Self-pay | Admitting: Emergency Medicine

## 2015-10-16 DIAGNOSIS — G8929 Other chronic pain: Secondary | ICD-10-CM | POA: Insufficient documentation

## 2015-10-16 DIAGNOSIS — E119 Type 2 diabetes mellitus without complications: Secondary | ICD-10-CM | POA: Insufficient documentation

## 2015-10-16 DIAGNOSIS — M25572 Pain in left ankle and joints of left foot: Secondary | ICD-10-CM | POA: Diagnosis present

## 2015-10-16 DIAGNOSIS — I10 Essential (primary) hypertension: Secondary | ICD-10-CM | POA: Insufficient documentation

## 2015-10-16 DIAGNOSIS — M79672 Pain in left foot: Secondary | ICD-10-CM | POA: Insufficient documentation

## 2015-10-16 DIAGNOSIS — Z79899 Other long term (current) drug therapy: Secondary | ICD-10-CM | POA: Insufficient documentation

## 2015-10-16 DIAGNOSIS — Z791 Long term (current) use of non-steroidal anti-inflammatories (NSAID): Secondary | ICD-10-CM | POA: Diagnosis not present

## 2015-10-16 DIAGNOSIS — Z792 Long term (current) use of antibiotics: Secondary | ICD-10-CM | POA: Diagnosis not present

## 2015-10-16 HISTORY — DX: Pure hypercholesterolemia, unspecified: E78.00

## 2015-10-16 MED ORDER — KETOROLAC TROMETHAMINE 60 MG/2ML IM SOLN
60.0000 mg | Freq: Once | INTRAMUSCULAR | Status: AC
Start: 2015-10-17 — End: 2015-10-17
  Administered 2015-10-17: 60 mg via INTRAMUSCULAR
  Filled 2015-10-16: qty 2

## 2015-10-17 DIAGNOSIS — M79672 Pain in left foot: Secondary | ICD-10-CM | POA: Diagnosis not present

## 2015-10-17 NOTE — Discharge Instructions (Signed)
Wear comfortable, well cushioned shoes at all times to help with your heel spurs and foot pain. Avoid prolonged standing if possible. Shoe inserts like Dr. Idolina Primer may help.

## 2015-10-17 NOTE — ED Provider Notes (Signed)
[facility] Emergency Department Provider Note  ____________________________________________  Time seen: 11:45 PM  I have reviewed the triage vital signs and the nursing notes.   HISTORY  Chief Complaint Ankle Pain    HPI Hannah Clarke is a 49 y.o. female who complains of chronic left foot pain in the heel. She states that she's been seen by her doctor for this and told she has heel spurs. The pain is worse after she has been standing all day at her job at [employer]. Denies any trauma. No leg swelling, no chest pain shortness of breath fever or chills.No numbness tingling or weakness.     Past Medical History  Diagnosis Date  . Hypertension   . Diabetes mellitus without complication ([place])   . High cholesterol      There are no active problems to display for this patient.    Past Surgical History  Procedure Laterality Date  . Hand surgery    . Kidney stone surgery       Current Outpatient Rx  Name  Route  Sig  Dispense  Refill  . azithromycin (ZITHROMAX Z-PAK) 250 MG tablet      Take 2 tablets (500 mg) on  Day 1,  followed by 1 tablet (250 mg) once daily on Days 2 through 5.   6 each   0   . benzonatate (TESSALON PERLES) 100 MG capsule      Take 1 - 2 every 8 hours prn cough   30 capsule   0   . chlorpheniramine-HYDROcodone (TUSSIONEX PENNKINETIC ER) 10-8 MG/5ML SUER   Oral   Take 5 mLs by mouth 2 (two) times daily.   100 mL   0   . naproxen (EC NAPROSYN) 500 MG EC tablet   Oral   Take 1 tablet (500 mg total) by mouth 2 (two) times daily with a meal.   30 tablet   0      Allergies Review of patient's allergies indicates no known allergies.   Family History  Problem Relation Age of Onset  . Breast cancer Mother 69    pre pt.     Social History Social History  Substance Use Topics  . Smoking status: Never Smoker   . Smokeless tobacco: Never Used  . Alcohol Use: No    Review of Systems  Constitutional:    No fever or chills. No weight changes Eyes:   No blurry vision or double vision.  ENT:   No sore throat. Cardiovascular:   No chest pain. Respiratory:   No dyspnea or cough. Gastrointestinal:   Negative for abdominal pain, vomiting and diarrhea.  No BRBPR or melena. Genitourinary:   Negative for dysuria, urinary retention, bloody urine, or difficulty urinating. Musculoskeletal:   Chronic left foot pain Skin:   Negative for rash. Neurological:   Negative for headaches, focal weakness or numbness. Psychiatric:  No anxiety or depression.   Endocrine:  No hot/cold intolerance, changes in energy, or sleep difficulty.  10-point ROS otherwise negative.  ____________________________________________   PHYSICAL EXAM:  VITAL SIGNS: ED Triage Vitals  Enc Vitals Group     BP 10/16/15 2209 171/98 mmHg     Pulse Rate 10/16/15 2209 73     Resp 10/16/15 2209 20     Temp 10/16/15 2209 98.2 F (36.8 C)     Temp Source 10/16/15 2209 Oral     SpO2 10/16/15 2209 100 %     Weight 10/16/15 2209 170 lb (77.111  kg)     Height 10/16/15 2209 5\' 2"  (1.575 m)     Head Cir --      Peak Flow --      Pain Score 10/16/15 2210 8     Pain Loc --      Pain Edu? --      Excl. in [place]? --      Constitutional:   Alert and oriented. Well appearing and in no distress. Eyes:   No scleral icterus. No conjunctival pallor. PERRL. EOMI ENT   Head:   Normocephalic and atraumatic.   Nose:   No congestion/rhinnorhea. No septal hematoma   Mouth/Throat:   MMM, no pharyngeal erythema. No peritonsillar mass. No uvula shift.   Neck:   No stridor. No SubQ emphysema. No meningismus. Hematological/Lymphatic/Immunilogical:   No cervical lymphadenopathy. Cardiovascular:   RRR. Normal and symmetric distal pulses are present in all extremities. No murmurs, rubs, or gallops. Respiratory:   Normal respiratory effort without tachypnea nor retractions. Breath sounds are clear and equal bilaterally. No  wheezes/rales/rhonchi. Gastrointestinal:   Soft and nontender. No distention. There is no CVA tenderness.  No rebound, rigidity, or guarding. Genitourinary:   deferred Musculoskeletal:   Nontender with normal range of motion in all extremities. No joint effusions.  There is some mild point tenderness at the plantar surface of the calcaneus near the insertion of the plantar fascia. No wounds or inflammation. No tenderness along the plantar fascia or arch. Tolerates gentle stretching of the sole of the foot..  No edema. Neurologic:   Normal speech and language.  CN 2-10 normal. Motor grossly intact.  Normal gait. No gross focal neurologic deficits are appreciated.  Skin:    Skin is warm, dry and intact. No rash noted.  No petechiae, purpura, or bullae. Psychiatric:   Mood and affect are normal. Speech and behavior are normal. Patient exhibits appropriate insight and judgment.  The patient shoes are missing the insoles, causing the patient to walk on the hard inner surface of the shoe ____________________________________________    LABS (pertinent positives/negatives) (all labs ordered are listed, but only abnormal results are displayed) Labs Reviewed - No data to display ____________________________________________   EKG    ____________________________________________    RADIOLOGY    ____________________________________________   PROCEDURES   ____________________________________________   INITIAL IMPRESSION / ASSESSMENT AND PLAN / ED COURSE  Pertinent labs & imaging results that were available during my care of the patient were reviewed by me and considered in my medical decision making (see chart for details).  Patient presents with chronic left foot pain which appears to be consistent with heel spur. Low suspicion for fracture or dislocation, no evidence of any infection including osteomyelitis. Patient counseled on conservative measures for heel spur including NSAIDs,  comfortable shoes, shoe inserts. Follow-up with primary care or podiatry.     ____________________________________________   FINAL CLINICAL IMPRESSION(S) / ED DIAGNOSES  Final diagnoses:  Chronic heel pain, left      [name], MD 10/17/15 [phone]

## 2015-11-07 ENCOUNTER — Emergency Department
Admission: EM | Admit: 2015-11-07 | Discharge: 2015-11-08 | Disposition: A | Discharge status: Home / Self Care | Payer: Medicare Other | Attending: Emergency Medicine | Admitting: Emergency Medicine

## 2015-11-07 ENCOUNTER — Encounter: Payer: Self-pay | Admitting: Emergency Medicine

## 2015-11-07 DIAGNOSIS — R05 Cough: Secondary | ICD-10-CM | POA: Diagnosis not present

## 2015-11-07 DIAGNOSIS — R519 Headache, unspecified: Secondary | ICD-10-CM

## 2015-11-07 DIAGNOSIS — I10 Essential (primary) hypertension: Secondary | ICD-10-CM | POA: Insufficient documentation

## 2015-11-07 DIAGNOSIS — Z792 Long term (current) use of antibiotics: Secondary | ICD-10-CM | POA: Insufficient documentation

## 2015-11-07 DIAGNOSIS — E119 Type 2 diabetes mellitus without complications: Secondary | ICD-10-CM | POA: Insufficient documentation

## 2015-11-07 DIAGNOSIS — Z79899 Other long term (current) drug therapy: Secondary | ICD-10-CM | POA: Insufficient documentation

## 2015-11-07 DIAGNOSIS — R112 Nausea with vomiting, unspecified: Secondary | ICD-10-CM | POA: Diagnosis present

## 2015-11-07 DIAGNOSIS — Z3202 Encounter for pregnancy test, result negative: Secondary | ICD-10-CM | POA: Insufficient documentation

## 2015-11-07 DIAGNOSIS — Z791 Long term (current) use of non-steroidal anti-inflammatories (NSAID): Secondary | ICD-10-CM | POA: Insufficient documentation

## 2015-11-07 DIAGNOSIS — R51 Headache: Secondary | ICD-10-CM | POA: Diagnosis not present

## 2015-11-07 HISTORY — DX: Headache, unspecified: R51.9

## 2015-11-07 HISTORY — DX: Headache: R51

## 2015-11-07 HISTORY — DX: Gastro-esophageal reflux disease without esophagitis: K21.9

## 2015-11-07 HISTORY — DX: Other chronic pain: G89.29

## 2015-11-07 HISTORY — DX: Anxiety disorder, unspecified: F41.9

## 2015-11-07 LAB — CBC
HCT: 38.6 % (ref 35.0–47.0)
Hemoglobin: 13.2 g/dL (ref 12.0–16.0)
MCH: 30.3 pg (ref 26.0–34.0)
MCHC: 34.3 g/dL (ref 32.0–36.0)
MCV: 88.2 fL (ref 80.0–100.0)
PLATELETS: 252 10*3/uL (ref 150–440)
RBC: 4.38 MIL/uL (ref 3.80–5.20)
RDW: 14.1 % (ref 11.5–14.5)
WBC: 6.1 10*3/uL (ref 3.6–11.0)

## 2015-11-07 LAB — URINALYSIS COMPLETE WITH MICROSCOPIC (ARMC ONLY)
Bilirubin Urine: NEGATIVE
Glucose, UA: NEGATIVE mg/dL
KETONES UR: NEGATIVE mg/dL
NITRITE: NEGATIVE
PH: 5 (ref 5.0–8.0)
Protein, ur: NEGATIVE mg/dL
SPECIFIC GRAVITY, URINE: 1.009 (ref 1.005–1.030)

## 2015-11-07 LAB — COMPREHENSIVE METABOLIC PANEL
ALT: 15 U/L (ref 14–54)
ANION GAP: 9 (ref 5–15)
AST: 11 U/L — ABNORMAL LOW (ref 15–41)
Albumin: 4.3 g/dL (ref 3.5–5.0)
Alkaline Phosphatase: 55 U/L (ref 38–126)
BILIRUBIN TOTAL: 0.5 mg/dL (ref 0.3–1.2)
BUN: 12 mg/dL (ref 6–20)
CHLORIDE: 111 mmol/L (ref 101–111)
CO2: 22 mmol/L (ref 22–32)
Calcium: 9.4 mg/dL (ref 8.9–10.3)
Creatinine, Ser: 0.49 mg/dL (ref 0.44–1.00)
Glucose, Bld: 125 mg/dL — ABNORMAL HIGH (ref 65–99)
POTASSIUM: 3.8 mmol/L (ref 3.5–5.1)
Sodium: 142 mmol/L (ref 135–145)
TOTAL PROTEIN: 7.1 g/dL (ref 6.5–8.1)

## 2015-11-07 LAB — LIPASE, BLOOD: LIPASE: 20 U/L (ref 11–51)

## 2015-11-07 LAB — PREGNANCY, URINE: PREG TEST UR: NEGATIVE

## 2015-11-07 MED ORDER — ALBUTEROL SULFATE HFA 108 (90 BASE) MCG/ACT IN AERS: 2.0000 | INHALATION_SPRAY | Freq: Four times a day (QID) | RESPIRATORY_TRACT | Status: AC | PRN

## 2015-11-07 MED ORDER — DIPHENHYDRAMINE HCL 50 MG/ML IJ SOLN
50.0000 mg | Freq: Once | INTRAMUSCULAR | Status: AC
  Administered 2015-11-07: 50 mg via INTRAVENOUS
  Filled 2015-11-07: qty 1

## 2015-11-07 MED ORDER — KETOROLAC TROMETHAMINE 30 MG/ML IJ SOLN
30.0000 mg | Freq: Once | INTRAMUSCULAR | Status: AC
  Administered 2015-11-07: 30 mg via INTRAVENOUS
  Filled 2015-11-07: qty 1

## 2015-11-07 MED ORDER — SODIUM CHLORIDE 0.9 % IV BOLUS (SEPSIS)
1000.0000 mL | Freq: Once | INTRAVENOUS | Status: AC
  Administered 2015-11-07: 1000 mL via INTRAVENOUS

## 2015-11-07 MED ORDER — PROCHLORPERAZINE EDISYLATE 5 MG/ML IJ SOLN
10.0000 mg | Freq: Once | INTRAMUSCULAR | Status: AC
  Administered 2015-11-07: 10 mg via INTRAVENOUS

## 2015-11-07 MED ORDER — PROCHLORPERAZINE EDISYLATE 5 MG/ML IJ SOLN
INTRAMUSCULAR | Status: AC
  Filled 2015-11-07: qty 2

## 2015-11-07 NOTE — Discharge Instructions (Signed)

## 2015-11-07 NOTE — ED Notes (Signed)
"  I feel very sick"  Describes vomiting x 1 today and c/o headache.  Patient states had a "nauseous pill" which helped but states she still feels bad.  Also c/o cough and being out of inhaler x 1 week.

## 2015-11-07 NOTE — ED Provider Notes (Signed)
[facility] Emergency Department Provider Note  ____________________________________________  Time seen: Approximately [id number] PM  I have reviewed the triage vital signs and the nursing notes.   HISTORY  Chief Complaint Nausea and Emesis    HPI Hannah Clarke is a 49 y.o. female with a history of hypertension and diabeteswho is presenting today with 1 day of right-sided headache. She says the headache started out mild and his grown in intensity today out of 10. She says that she is also had nausea and vomiting today that is associated with a headache. She says that she is a history of headaches similar to this and often gets nausea and vomiting with them. She says that the light also bothers her. Denies pain over the right temple. Denies any blurred vision. Denies any abdominal pain. She is also requesting refill of her albuterol.    Past Medical History  Diagnosis Date  . Hypertension   . Diabetes mellitus without complication ([place])   . High cholesterol   . Chronic headaches   . GERD (gastroesophageal reflux disease)   . Anxiety     There are no active problems to display for this patient.   Past Surgical History  Procedure Laterality Date  . Hand surgery    . Kidney stone surgery      Current Outpatient Rx  Name  Route  Sig  Dispense  Refill  . azithromycin (ZITHROMAX Z-PAK) 250 MG tablet      Take 2 tablets (500 mg) on  Day 1,  followed by 1 tablet (250 mg) once daily on Days 2 through 5.   6 each   0   . benzonatate (TESSALON PERLES) 100 MG capsule      Take 1 - 2 every 8 hours prn cough   30 capsule   0   . chlorpheniramine-HYDROcodone (TUSSIONEX PENNKINETIC ER) 10-8 MG/5ML SUER   Oral   Take 5 mLs by mouth 2 (two) times daily.   100 mL   0   . naproxen (EC NAPROSYN) 500 MG EC tablet   Oral   Take 1 tablet (500 mg total) by mouth 2 (two) times daily with a meal.   30 tablet   0     Allergies Review of patient's allergies  indicates no known allergies.  Family History  Problem Relation Age of Onset  . Breast cancer Mother 50    pre pt.     Social History Social History  Substance Use Topics  . Smoking status: Never Smoker   . Smokeless tobacco: Never Used  . Alcohol Use: No    Review of Systems Constitutional: No fever/chills Eyes: No visual changes. ENT: No sore throat. Cardiovascular: Denies chest pain. Respiratory: Positive for cough. Gastrointestinal: No abdominal pain.  No nausea, no vomiting.  No diarrhea.  No constipation. Genitourinary: Negative for dysuria. Musculoskeletal: Negative for back pain. Skin: Negative for rash. Neurological: Negative for  focal weakness or numbness.  10-point ROS otherwise negative.  ____________________________________________   PHYSICAL EXAM:  VITAL SIGNS: ED Triage Vitals  Enc Vitals Group     BP 11/07/15 1926 156/108 mmHg     Pulse Rate 11/07/15 1926 70     Resp 11/07/15 1926 18     Temp 11/07/15 1926 98.4 F (36.9 C)     Temp Source 11/07/15 1926 Oral     SpO2 11/07/15 1926 96 %     Weight 11/07/15 1926 170 lb (77.111 kg)     Height 11/07/15  1926 5\' 2"  (1.575 m)     Head Cir --      Peak Flow --      Pain Score 11/07/15 1927 10     Pain Loc --      Pain Edu? --      Excl. in [place]? --     Constitutional: Alert and oriented. Well appearing and in no acute distress. Eyes: Conjunctivae are normal. PERRL. EOMI. Head: Atraumatic. Nose: No congestion/rhinnorhea. Mouth/Throat: Mucous membranes are moist.  Oropharynx non-erythematous. Neck: No stridor.   Cardiovascular: Normal rate, regular rhythm. Grossly normal heart sounds.  Good peripheral circulation. Respiratory: Normal respiratory effort.  No retractions. Lungs CTAB. Gastrointestinal: Soft and nontender. No distention. No abdominal bruits. No CVA tenderness. Musculoskeletal: No lower extremity tenderness nor edema.  No joint effusions. Neurologic:  Normal speech and language. No  gross focal neurologic deficits are appreciated. No gait instability. Skin:  Skin is warm, dry and intact. No rash noted. Psychiatric: Mood and affect are normal. Speech and behavior are normal.  ____________________________________________   LABS (all labs ordered are listed, but only abnormal results are displayed)  Labs Reviewed  URINALYSIS COMPLETEWITH MICROSCOPIC ([place]) - Abnormal; Notable for the following:    Color, Urine YELLOW (*)    APPearance CLEAR (*)    Hgb urine dipstick 2+ (*)    Leukocytes, UA 1+ (*)    Bacteria, UA RARE (*)    Squamous Epithelial / LPF 0-5 (*)    All other components within normal limits  COMPREHENSIVE METABOLIC PANEL - Abnormal; Notable for the following:    Glucose, Bld 125 (*)    AST 11 (*)    All other components within normal limits  LIPASE, BLOOD  CBC  PREGNANCY, URINE   ____________________________________________  EKG   ____________________________________________  RADIOLOGY   ____________________________________________   PROCEDURES   ____________________________________________   INITIAL IMPRESSION / ASSESSMENT AND PLAN / ED COURSE  Pertinent labs & imaging results that were available during my care of the patient were reviewed by me and considered in my medical decision making (see chart for details).  ----------------------------------------- 11:34 PM on 11/07/2015 -----------------------------------------  Patient without coughing throughout the visit. She is resting up with this time and says her headache is completely resolved. No vomiting as well. Headache is migrainous in quality. Also with clear lungs. We'll refill her albuterol. Do not suspect intracranial bleed at this time. Headache was not sudden onset or worst headache of life. Also relieved with migraine treatment. Also doubt temporal arteritis there was no pain over the right temple or any vision change. We'll discharge to  home.  ____________________________________________   FINAL CLINICAL IMPRESSION(S) / ED DIAGNOSES  Acute headache. Resolved.    Orbie Pyo, MD 11/07/15 [phone]

## 2015-11-08 NOTE — ED Notes (Signed)
Called cab for pt per her request. Pt discharged to lobby at this time.

## 2015-11-09 LAB — URINE CULTURE: Culture: 80000

## 2016-02-02 ENCOUNTER — Emergency Department: Payer: Medicare Other

## 2016-02-02 DIAGNOSIS — H9201 Otalgia, right ear: Secondary | ICD-10-CM | POA: Insufficient documentation

## 2016-02-02 DIAGNOSIS — Z79899 Other long term (current) drug therapy: Secondary | ICD-10-CM | POA: Diagnosis not present

## 2016-02-02 DIAGNOSIS — R05 Cough: Secondary | ICD-10-CM | POA: Insufficient documentation

## 2016-02-02 DIAGNOSIS — R0789 Other chest pain: Secondary | ICD-10-CM | POA: Insufficient documentation

## 2016-02-02 DIAGNOSIS — I10 Essential (primary) hypertension: Secondary | ICD-10-CM | POA: Diagnosis not present

## 2016-02-02 DIAGNOSIS — E119 Type 2 diabetes mellitus without complications: Secondary | ICD-10-CM | POA: Diagnosis not present

## 2016-02-02 DIAGNOSIS — F439 Reaction to severe stress, unspecified: Secondary | ICD-10-CM | POA: Diagnosis not present

## 2016-02-02 DIAGNOSIS — M25522 Pain in left elbow: Secondary | ICD-10-CM | POA: Insufficient documentation

## 2016-02-02 DIAGNOSIS — F419 Anxiety disorder, unspecified: Secondary | ICD-10-CM | POA: Insufficient documentation

## 2016-02-02 DIAGNOSIS — R079 Chest pain, unspecified: Secondary | ICD-10-CM | POA: Diagnosis present

## 2016-02-02 NOTE — ED Notes (Signed)
Patient ambulatory to triage with steady gait, without difficulty or distress noted; pt reports falling on elbow last week and was seen by urgent care and has wrap in place; st xray normal but continues to have pain; also c/o of mid chest pain, st "I feel it popping"; also c/o right earache

## 2016-02-03 ENCOUNTER — Emergency Department
Admission: EM | Admit: 2016-02-03 | Discharge: 2016-02-03 | Disposition: A | Discharge status: Home / Self Care | Payer: Medicare Other | Attending: Emergency Medicine | Admitting: Emergency Medicine

## 2016-02-03 DIAGNOSIS — R0789 Other chest pain: Secondary | ICD-10-CM

## 2016-02-03 DIAGNOSIS — F439 Reaction to severe stress, unspecified: Secondary | ICD-10-CM

## 2016-02-03 DIAGNOSIS — M25522 Pain in left elbow: Secondary | ICD-10-CM

## 2016-02-03 DIAGNOSIS — R079 Chest pain, unspecified: Secondary | ICD-10-CM

## 2016-02-03 DIAGNOSIS — F419 Anxiety disorder, unspecified: Secondary | ICD-10-CM

## 2016-02-03 LAB — BASIC METABOLIC PANEL
ANION GAP: 8 (ref 5–15)
BUN: 18 mg/dL (ref 6–20)
CALCIUM: 8.8 mg/dL — AB (ref 8.9–10.3)
CO2: 23 mmol/L (ref 22–32)
Chloride: 112 mmol/L — ABNORMAL HIGH (ref 101–111)
Creatinine, Ser: 0.51 mg/dL (ref 0.44–1.00)
GFR calc non Af Amer: >60 mL/min (ref >60)
Glucose, Bld: 102 mg/dL — ABNORMAL HIGH (ref 65–99)
Potassium: 3.4 mmol/L — ABNORMAL LOW (ref 3.5–5.1)
Sodium: 143 mmol/L (ref 135–145)

## 2016-02-03 LAB — TROPONIN I

## 2016-02-03 LAB — CBC
HCT: 36.7 % (ref 35.0–47.0)
HEMOGLOBIN: 12.4 g/dL (ref 12.0–16.0)
MCH: 29.2 pg (ref 26.0–34.0)
MCHC: 33.8 g/dL (ref 32.0–36.0)
MCV: 86.2 fL (ref 80.0–100.0)
Platelets: 238 10*3/uL (ref 150–440)
RBC: 4.25 MIL/uL (ref 3.80–5.20)
RDW: 14.5 % (ref 11.5–14.5)
WBC: 9.4 10*3/uL (ref 3.6–11.0)

## 2016-02-03 MED ORDER — LORAZEPAM 1 MG PO TABS: 1.0000 mg | ORAL_TABLET | Freq: Three times a day (TID) | ORAL | Status: AC | PRN

## 2016-02-03 MED ORDER — LORAZEPAM 1 MG PO TABS
1.0000 mg | ORAL_TABLET | Freq: Once | ORAL | Status: AC
  Administered 2016-02-03: 1 mg via ORAL
  Filled 2016-02-03: qty 1

## 2016-02-03 NOTE — Discharge Instructions (Signed)
1. You may take anxiety medicine as needed (Ativan #15). 2. Apply moist heat to affected area several times a day daily. 3. Return to the ER for worsening symptoms, persistent vomiting, difficulty breathing or other concerns.  Generalized Anxiety Disorder Generalized anxiety disorder (GAD) is a mental disorder. It interferes with life functions, including relationships, work, and school. GAD is different from normal anxiety, which everyone experiences at some point in their lives in response to specific life events and activities. Normal anxiety actually helps [place] prepare for and get through these life events and activities. Normal anxiety goes away after the event or activity is over.  GAD causes anxiety that is not necessarily related to specific events or activities. It also causes excess anxiety in proportion to specific events or activities. The anxiety associated with GAD is also difficult to control. GAD can vary from mild to severe. People with severe GAD can have intense waves of anxiety with physical symptoms (panic attacks).  SYMPTOMS The anxiety and worry associated with GAD are difficult to control. This anxiety and worry are related to many life events and activities and also occur more days than not for 6 months or longer. People with GAD also have three or more of the following symptoms (one or more in children):  Restlessness.   Fatigue.  Difficulty concentrating.   Irritability.  Muscle tension.  Difficulty sleeping or unsatisfying sleep. DIAGNOSIS GAD is diagnosed through an assessment by your health care provider. Your health care provider will ask you questions aboutyour mood,physical symptoms, and events in your life. Your health care provider may ask you about your medical history and use of alcohol or drugs, including prescription medicines. Your health care provider may also do a physical exam and blood tests. Certain medical conditions and the use of certain  substances can cause symptoms similar to those associated with GAD. Your health care provider may refer you to a mental health specialist for further evaluation. TREATMENT The following therapies are usually used to treat GAD:   Medication. Antidepressant medication usually is prescribed for long-term daily control. Antianxiety medicines may be added in severe cases, especially when panic attacks occur.   Talk therapy (psychotherapy). Certain types of talk therapy can be helpful in treating GAD by providing support, education, and guidance. A form of talk therapy called cognitive behavioral therapy can teach you healthy ways to think about and react to daily life events and activities.  Stress managementtechniques. These include yoga, meditation, and exercise and can be very helpful when they are practiced regularly. A mental health specialist can help determine which treatment is best for you. Some people see improvement with one therapy. However, other people require a combination of therapies.   This information is not intended to replace advice given to you by your health care provider. Make sure you discuss any questions you have with your health care provider.   Document Released: 03/17/2013 Document Revised: 12/11/2014 Document Reviewed: 03/17/2013 Elsevier Interactive Patient Education 2016 Elsevier Inc.  Nonspecific Chest Pain  Chest pain can be caused by many different conditions. There is always a chance that your pain could be related to something serious, such as a heart attack or a blood clot in your lungs. Chest pain can also be caused by conditions that are not life-threatening. If you have chest pain, it is very important to follow up with your health care provider. CAUSES  Chest pain can be caused by:  Heartburn.  Pneumonia or bronchitis.  Anxiety or stress.  Inflammation around your heart (pericarditis) or lung (pleuritis or pleurisy).  A blood clot in your  lung.  A collapsed lung (pneumothorax). It can develop suddenly on its own (spontaneous pneumothorax) or from trauma to the chest.  Shingles infection (varicella-zoster virus).  Heart attack.  Damage to the bones, muscles, and cartilage that make up your chest wall. This can include:  Bruised bones due to injury.  Strained muscles or cartilage due to frequent or repeated coughing or overwork.  Fracture to one or more ribs.  Sore cartilage due to inflammation (costochondritis). RISK FACTORS  Risk factors for chest pain may include:  Activities that increase your risk for trauma or injury to your chest.  Respiratory infections or conditions that cause frequent coughing.  Medical conditions or overeating that can cause heartburn.  Heart disease or family history of heart disease.  Conditions or health behaviors that increase your risk of developing a blood clot.  Having had chicken pox (varicella zoster). SIGNS AND SYMPTOMS Chest pain can feel like:  Burning or tingling on the surface of your chest or deep in your chest.  Crushing, pressure, aching, or squeezing pain.  Dull or sharp pain that is worse when you move, cough, or take a deep breath.  Pain that is also felt in your back, neck, shoulder, or arm, or pain that spreads to any of these areas. Your chest pain may come and go, or it may stay constant. DIAGNOSIS Lab tests or other studies may be needed to find the cause of your pain. Your health care provider may have you take a test called an ambulatory ECG (electrocardiogram). An ECG records your heartbeat patterns at the time the test is performed. You may also have other tests, such as:  Transthoracic echocardiogram (TTE). During echocardiography, sound waves are used to create a picture of all of the heart structures and to look at how blood flows through your heart.  Transesophageal echocardiogram (TEE).This is a more advanced imaging test that obtains images  from inside your body. It allows your health care provider to see your heart in finer detail.  Cardiac monitoring. This allows your health care provider to monitor your heart rate and rhythm in real time.  Holter monitor. This is a portable device that records your heartbeat and can help to diagnose abnormal heartbeats. It allows your health care provider to track your heart activity for several days, if needed.  Stress tests. These can be done through exercise or by taking medicine that makes your heart beat more quickly.  Blood tests.  Imaging tests. TREATMENT  Your treatment depends on what is causing your chest pain. Treatment may include:  Medicines. These may include:  Acid blockers for heartburn.  Anti-inflammatory medicine.  Pain medicine for inflammatory conditions.  Antibiotic medicine, if an infection is present.  Medicines to dissolve blood clots.  Medicines to treat coronary artery disease.  Supportive care for conditions that do not require medicines. This may include:  Resting.  Applying heat or cold packs to injured areas.  Limiting activities until pain decreases. HOME CARE INSTRUCTIONS  If you were prescribed an antibiotic medicine, finish it all even if you start to feel better.  Avoid any activities that bring on chest pain.  Do not use any tobacco products, including cigarettes, chewing tobacco, or electronic cigarettes. If you need help quitting, ask your health care provider.  Do not drink alcohol.  Take medicines only as directed by your health care provider.  Keep all follow-up  visits as directed by your health care provider. This is important. This includes any further testing if your chest pain does not go away.  If heartburn is the cause for your chest pain, you may be told to keep your head raised (elevated) while sleeping. This reduces the chance that acid will go from your stomach into your esophagus.  Make lifestyle changes as  directed by your health care provider. These may include:  Getting regular exercise. Ask your health care provider to suggest some activities that are safe for you.  Eating a heart-healthy diet. A registered dietitian can help you to learn healthy eating options.  Maintaining a healthy weight.  Managing diabetes, if necessary.  Reducing stress. SEEK MEDICAL CARE IF:  Your chest pain does not go away after treatment.  You have a rash with blisters on your chest.  You have a fever. SEEK IMMEDIATE MEDICAL CARE IF:   Your chest pain is worse.  You have an increasing cough, or you cough up blood.  You have severe abdominal pain.  You have severe weakness.  You faint.  You have chills.  You have sudden, unexplained chest discomfort.  You have sudden, unexplained discomfort in your arms, back, neck, or jaw.  You have shortness of breath at any time.  You suddenly start to sweat, or your skin gets clammy.  You feel nauseous or you vomit.  You suddenly feel light-headed or dizzy.  Your heart begins to beat quickly, or it feels like it is skipping beats. These symptoms may represent a serious problem that is an emergency. Do not wait to see if the symptoms will go away. Get medical help right away. Call your local emergency services (911 in the [place] Do not drive yourself to the hospital.   This information is not intended to replace advice given to you by your health care provider. Make sure you discuss any questions you have with your health care provider.   Document Released: 08/30/2005 Document Revised: 12/11/2014 Document Reviewed: 06/26/2014 Elsevier Interactive Patient Education 2016 Elsevier Inc.  Chest Wall Pain Chest wall pain is pain in or around the bones and muscles of your chest. Sometimes, an injury causes this pain. Sometimes, the cause may not be known. This pain may take several weeks or longer to get better. HOME CARE INSTRUCTIONS  Pay attention to  any changes in your symptoms. Take these actions to help with your pain:   Rest as told by your health care provider.   Avoid activities that cause pain. These include any activities that use your chest muscles or your abdominal and side muscles to lift heavy items.   If directed, apply ice to the painful area:  Put ice in a plastic bag.  Place a towel between your skin and the bag.  Leave the ice on for 20 minutes, 2-3 times per day.  Take over-the-counter and prescription medicines only as told by your health care provider.  Do not use tobacco products, including cigarettes, chewing tobacco, and e-cigarettes. If you need help quitting, ask your health care provider.  Keep all follow-up visits as told by your health care provider. This is important. SEEK MEDICAL CARE IF:  You have a fever.  Your chest pain becomes worse.  You have new symptoms. SEEK IMMEDIATE MEDICAL CARE IF:  You have nausea or vomiting.  You feel sweaty or light-headed.  You have a cough with phlegm (sputum) or you cough up blood.  You develop shortness of breath.  This information is not intended to replace advice given to you by your health care provider. Make sure you discuss any questions you have with your health care provider.   Document Released: 11/20/2005 Document Revised: 08/11/2015 Document Reviewed: 02/15/2015 Elsevier Interactive Patient Education 2016 [employer] Stress Management Stress is a normal reaction to life events. It is what you feel when life demands more than you are used to or more than you can handle. Some stress can be useful. For example, the stress reaction can help you catch the last bus of the day, study for a test, or meet a deadline at work. But stress that occurs too often or for too long can cause problems. It can affect your emotional health and interfere with relationships and normal daily activities. Too much stress can weaken your immune system  and increase your risk for physical illness. If you already have a medical problem, stress can make it worse. CAUSES  All sorts of life events may cause stress. An event that causes stress for one person may not be stressful for another person. Major life events commonly cause stress. These may be positive or negative. Examples include losing your job, moving into a new home, getting married, having a baby, or losing a loved one. Less obvious life events may also cause stress, especially if they occur day after day or in combination. Examples include working long hours, driving in traffic, caring for children, being in debt, or being in a difficult relationship. SIGNS AND SYMPTOMS Stress may cause emotional symptoms including, the following:  Anxiety. This is feeling worried, afraid, on edge, overwhelmed, or out of control.  Anger. This is feeling irritated or impatient.  Depression. This is feeling sad, down, helpless, or guilty.  Difficulty focusing, remembering, or making decisions. Stress may cause physical symptoms, including the following:   Aches and pains. These may affect your head, neck, back, stomach, or other areas of your body.  Tight muscles or clenched jaw.  Low energy or trouble sleeping. Stress may cause unhealthy behaviors, including the following:   Eating to feel better (overeating) or skipping meals.  Sleeping too little, too much, or both.  Working too much or putting off tasks (procrastination).  Smoking, drinking alcohol, or using drugs to feel better. DIAGNOSIS  Stress is diagnosed through an assessment by your health care provider. Your health care provider will ask questions about your symptoms and any stressful life events.Your health care provider will also ask about your medical history and may order blood tests or other tests. Certain medical conditions and medicine can cause physical symptoms similar to stress. Mental illness can cause emotional  symptoms and unhealthy behaviors similar to stress. Your health care provider may refer you to a mental health professional for further evaluation.  TREATMENT  Stress management is the recommended treatment for stress.The goals of stress management are reducing stressful life events and coping with stress in healthy ways.  Techniques for reducing stressful life events include the following:  Stress identification. Self-monitor for stress and identify what causes stress for you. These skills may help you to avoid some stressful events.  Time management. Set your priorities, keep a calendar of events, and learn to say "no." These tools can help you avoid making too many commitments. Techniques for coping with stress include the following:  Rethinking the problem. Try to think realistically about stressful events rather than ignoring them or overreacting. Try to find the positives in a stressful situation  rather than focusing on the negatives.  Exercise. Physical exercise can release both physical and emotional tension. The key is to find a form of exercise you enjoy and do it regularly.  Relaxation techniques. These relax the body and mind. Examples include yoga, meditation, tai chi, biofeedback, deep breathing, progressive muscle relaxation, listening to music, being out in nature, journaling, and other hobbies. Again, the key is to find one or more that you enjoy and can do regularly.  Healthy lifestyle. Eat a balanced diet, get plenty of sleep, and do not smoke. Avoid using alcohol or drugs to relax.  Strong support network. Spend time with family, friends, or other people you enjoy being around.Express your feelings and talk things over with someone you trust. Counseling or talktherapy with a mental health professional may be helpful if you are having difficulty managing stress on your own. Medicine is typically not recommended for the treatment of stress.Talk to your health care provider if  you think you need medicine for symptoms of stress. HOME CARE INSTRUCTIONS  Keep all follow-up visits as directed by your health care provider.  Take all medicines as directed by your health care provider. SEEK MEDICAL CARE IF:  Your symptoms get worse or you start having new symptoms.  You feel overwhelmed by your problems and can no longer manage them on your own. SEEK IMMEDIATE MEDICAL CARE IF:  You feel like hurting yourself or someone else.   This information is not intended to replace advice given to you by your health care provider. Make sure you discuss any questions you have with your health care provider.   Document Released: 05/16/2001 Document Revised: 12/11/2014 Document Reviewed: 07/15/2013 Elsevier Interactive Patient Education [employer].

## 2016-02-03 NOTE — ED Notes (Signed)
Pt c/o not feeling well for the last week - Pt states today that her chest is "popping" in the center - She reports she is anxious and stressed because her cousin passed away - Pt reports cough for since this afternoon but non-productive

## 2016-02-03 NOTE — ED Provider Notes (Signed)
[facility] Emergency Department Provider Note  ____________________________________________  Time seen: Approximately 1:24 AM  I have reviewed the triage vital signs and the nursing notes.   HISTORY  Chief Complaint Chest Pain and Otalgia    HPI Hannah Clarke is a 50 y.o. female who presents to the ED from home with a chief complaint of anxiety, stress, chest "popping" and right earache. Patient reports recent death of cousin whom she buried this week. This has caused increased stress and anxiety in the patient, especially when she interacts with her mother. Also reports falling onto left elbow last week; states she was seen by urgent care and has an ace wrap in place. X-rays taken at that time were normal. Complains of persistent left elbow pain. Also notes right ear pain times one day. Denies discharge. Denies or trauma or recent swimming. Also complains of "popping" sensation to mid chest 2 days. Symptoms associated with dry cough. Denies associated symptoms of fever, chills, shortness of breath, diaphoresis, nausea, vomiting, diarrhea, abdominal pain. Denies recent travel or hormone use. Nothing makes her symptoms better. Stress and anxiety makes her symptoms worse.   Past Medical History  Diagnosis Date  . Hypertension   . Diabetes mellitus without complication ([place])   . High cholesterol   . Chronic headaches   . GERD (gastroesophageal reflux disease)   . Anxiety     There are no active problems to display for this patient.   Past Surgical History  Procedure Laterality Date  . Hand surgery    . Kidney stone surgery      Current Outpatient Rx  Name  Route  Sig  Dispense  Refill  . albuterol (PROVENTIL HFA;VENTOLIN HFA) 108 (90 BASE) MCG/ACT inhaler   Inhalation   Inhale 2 puffs into the lungs every 6 (six) hours as needed for wheezing or shortness of breath.   1 Inhaler   2   . azithromycin (ZITHROMAX Z-PAK) 250 MG tablet      Take 2  tablets (500 mg) on  Day 1,  followed by 1 tablet (250 mg) once daily on Days 2 through 5.   6 each   0   . benzonatate (TESSALON PERLES) 100 MG capsule      Take 1 - 2 every 8 hours prn cough   30 capsule   0   . chlorpheniramine-HYDROcodone (TUSSIONEX PENNKINETIC ER) 10-8 MG/5ML SUER   Oral   Take 5 mLs by mouth 2 (two) times daily.   100 mL   0   . naproxen (EC NAPROSYN) 500 MG EC tablet   Oral   Take 1 tablet (500 mg total) by mouth 2 (two) times daily with a meal.   30 tablet   0     Allergies Review of patient's allergies indicates no known allergies.  Family History  Problem Relation Age of Onset  . Breast cancer Mother 35    pre pt.     Social History Social History  Substance Use Topics  . Smoking status: Never Smoker   . Smokeless tobacco: Never Used  . Alcohol Use: No    Review of Systems  Constitutional: No fever/chills. Eyes: No visual changes. ENT: No sore throat. Cardiovascular: Positive for chest pain. Respiratory: Denies shortness of breath. Gastrointestinal: No abdominal pain.  No nausea, no vomiting.  No diarrhea.  No constipation. Genitourinary: Negative for dysuria. Musculoskeletal: Positive for left elbow pain. Negative for back pain. Skin: Negative for rash. Neurological: Negative for headaches,  focal weakness or numbness.  10-point ROS otherwise negative.  ____________________________________________   PHYSICAL EXAM:  VITAL SIGNS: ED Triage Vitals  Enc Vitals Group     BP 02/02/16 2315 139/81 mmHg     Pulse Rate 02/02/16 2315 98     Resp 02/02/16 2315 17     Temp 02/02/16 2315 98.4 F (36.9 C)     Temp Source 02/02/16 2315 Oral     SpO2 02/02/16 2315 98 %     Weight 02/02/16 2315 172 lb (78.019 kg)     Height 02/02/16 2315 5\' 2"  (1.575 m)     Head Cir --      Peak Flow --      Pain Score 02/02/16 2334 9     Pain Loc --      Pain Edu? --      Excl. in [place]? --     Constitutional: Alert and oriented. Well appearing  and mildly anxious. Eyes: Conjunctivae are normal. PERRL. EOMI. Head: Atraumatic. Ears: Right TM bulging with clear fluid without perforation. No erythema noted. Left TM within normal limits. Nose: No congestion/rhinnorhea. Mouth/Throat: Mucous membranes are moist.  Oropharynx non-erythematous. Neck: No stridor.  No cervical spine tenderness to palpation. Hematological/Lymphatic/Immunilogical: No cervical lymphadenopathy. Cardiovascular: Normal rate, regular rhythm. Grossly normal heart sounds.  Good peripheral circulation. Respiratory: Normal respiratory effort.  No retractions. Lungs CTAB. Anterior chest wall tender to palpation and with movement of trunk. No external evidence of injury noted. Gastrointestinal: Soft and nontender. No distention. No abdominal bruits. No CVA tenderness. Musculoskeletal: Left elbow in Ace wrap. Full range of motion with minimal pain. 2+ radial pulses. Motor strength and sensation intact. No lower extremity tenderness nor edema.  No joint effusions. Neurologic:  Normal speech and language. No gross focal neurologic deficits are appreciated. No gait instability. Skin:  Skin is warm, dry and intact. No rash noted. Psychiatric: Mood and affect are mildly anxious. Speech and behavior are normal.  ____________________________________________   LABS (all labs ordered are listed, but only abnormal results are displayed)  Labs Reviewed  BASIC METABOLIC PANEL - Abnormal; Notable for the following:    Potassium 3.4 (*)    Chloride 112 (*)    Glucose, Bld 102 (*)    Calcium 8.8 (*)    All other components within normal limits  CBC  TROPONIN I   ____________________________________________  EKG  ED ECG REPORT I, Tyah Acord J, the attending physician, personally viewed and interpreted this ECG.   Date: 02/03/2016  EKG Time: 2322  Rate: 58  Rhythm: sinus bradycardia  Axis: Normal  Intervals:none  ST&T Change:  Nonspecific  ____________________________________________  RADIOLOGY  Chest 2 view (viewed by me, interpreted per Dr. Quintella Reichert): No active cardiopulmonary disease.  Left elbow x-rays (viewed by me, interpreted per Dr. Radene Knee): No evidence of fracture or dislocation. ____________________________________________   PROCEDURES  Procedure(s) performed: None  Critical Care performed: No  ____________________________________________   INITIAL IMPRESSION / ASSESSMENT AND PLAN / ED COURSE  Pertinent labs & imaging results that were available during my care of the patient were reviewed by me and considered in my medical decision making (see chart for details).  50 year old female who presents with multiple medical complaints, chiefly increased stress and anxiety secondary to recent death of cousin. Laboratory and imaging results unremarkable; negative troponin in the setting of chest wall pain greater than 24 hours. ACS, PE, aortic dissection very unlikely. Will treat with low-dose benzodiazepine; patient has had Ativan before with good results. She  will follow up closely with her PCP next week. Strict return precautions given. Patient verbalizes understanding and agrees with plan of care. ____________________________________________   FINAL CLINICAL IMPRESSION(S) / ED DIAGNOSES  Final diagnoses:  Anxiety  Stress  Chest wall pain  Elbow pain, left  Chest pain, unspecified chest pain type      Paulette Blanch, MD 02/03/16 [phone]

## 2016-03-30 ENCOUNTER — Emergency Department
Admission: EM | Admit: 2016-03-30 | Discharge: 2016-03-30 | Disposition: A | Discharge status: Home / Self Care | Payer: Medicare Other | Attending: Emergency Medicine | Admitting: Emergency Medicine

## 2016-03-30 ENCOUNTER — Emergency Department: Payer: Medicare Other

## 2016-03-30 ENCOUNTER — Encounter: Payer: Self-pay | Admitting: Emergency Medicine

## 2016-03-30 DIAGNOSIS — Z792 Long term (current) use of antibiotics: Secondary | ICD-10-CM | POA: Diagnosis not present

## 2016-03-30 DIAGNOSIS — Z79899 Other long term (current) drug therapy: Secondary | ICD-10-CM | POA: Insufficient documentation

## 2016-03-30 DIAGNOSIS — N390 Urinary tract infection, site not specified: Secondary | ICD-10-CM

## 2016-03-30 DIAGNOSIS — I10 Essential (primary) hypertension: Secondary | ICD-10-CM | POA: Diagnosis not present

## 2016-03-30 DIAGNOSIS — E119 Type 2 diabetes mellitus without complications: Secondary | ICD-10-CM | POA: Insufficient documentation

## 2016-03-30 DIAGNOSIS — R300 Dysuria: Secondary | ICD-10-CM | POA: Diagnosis present

## 2016-03-30 HISTORY — DX: Developmental disorder of scholastic skills, unspecified: F81.9

## 2016-03-30 HISTORY — DX: Panic disorder (episodic paroxysmal anxiety): F41.0

## 2016-03-30 LAB — COMPREHENSIVE METABOLIC PANEL
ALK PHOS: 56 U/L (ref 38–126)
ALT: 16 U/L (ref 14–54)
ANION GAP: 11 (ref 5–15)
AST: 14 U/L — ABNORMAL LOW (ref 15–41)
Albumin: 4.6 g/dL (ref 3.5–5.0)
BUN: 14 mg/dL (ref 6–20)
CALCIUM: 9.4 mg/dL (ref 8.9–10.3)
CO2: 22 mmol/L (ref 22–32)
Chloride: 110 mmol/L (ref 101–111)
Creatinine, Ser: 0.48 mg/dL (ref 0.44–1.00)
GFR calc non Af Amer: >60 mL/min (ref >60)
Glucose, Bld: 97 mg/dL (ref 65–99)
Potassium: 3.8 mmol/L (ref 3.5–5.1)
SODIUM: 143 mmol/L (ref 135–145)
Total Bilirubin: 0.5 mg/dL (ref 0.3–1.2)
Total Protein: 7.3 g/dL (ref 6.5–8.1)

## 2016-03-30 LAB — CBC
HCT: 39.2 % (ref 35.0–47.0)
HEMOGLOBIN: 13.1 g/dL (ref 12.0–16.0)
MCH: 29.5 pg (ref 26.0–34.0)
MCHC: 33.5 g/dL (ref 32.0–36.0)
MCV: 88.2 fL (ref 80.0–100.0)
Platelets: 255 10*3/uL (ref 150–440)
RBC: 4.45 MIL/uL (ref 3.80–5.20)
RDW: 14.4 % (ref 11.5–14.5)
WBC: 6.7 10*3/uL (ref 3.6–11.0)

## 2016-03-30 LAB — URINALYSIS COMPLETE WITH MICROSCOPIC (ARMC ONLY)
Bacteria, UA: NONE SEEN
Bilirubin Urine: NEGATIVE
Glucose, UA: NEGATIVE mg/dL
Ketones, ur: NEGATIVE mg/dL
NITRITE: POSITIVE — AB
PH: 6 (ref 5.0–8.0)
PROTEIN: NEGATIVE mg/dL
SPECIFIC GRAVITY, URINE: 1.009 (ref 1.005–1.030)
Squamous Epithelial / LPF: NONE SEEN

## 2016-03-30 LAB — LIPASE, BLOOD: LIPASE: 15 U/L (ref 11–51)

## 2016-03-30 LAB — PREGNANCY, URINE: PREG TEST UR: NEGATIVE

## 2016-03-30 MED ORDER — CEPHALEXIN 500 MG PO CAPS
500.0000 mg | ORAL_CAPSULE | Freq: Once | ORAL | Status: AC
  Administered 2016-03-30: 500 mg via ORAL

## 2016-03-30 MED ORDER — CEPHALEXIN 500 MG PO CAPS: 500.0000 mg | ORAL_CAPSULE | Freq: Three times a day (TID) | ORAL | Status: DC

## 2016-03-30 MED ORDER — CEPHALEXIN 500 MG PO CAPS
ORAL_CAPSULE | ORAL | Status: AC
  Administered 2016-03-30: 500 mg via ORAL
  Filled 2016-03-30: qty 1

## 2016-03-30 MED ORDER — POLYETHYLENE GLYCOL 3350 17 GM/SCOOP PO POWD: 17.0000 g | Freq: Every day | ORAL | Status: DC

## 2016-03-30 NOTE — ED Provider Notes (Signed)
[facility] Emergency Department Provider Note  Time seen: 8:39 PM  I have reviewed the triage vital signs and the nursing notes.   HISTORY  Chief Complaint Abdominal Pain and Dysuria    HPI Hannah Clarke is a 50 y.o. female with a past medical history of hypertension, hyperlipidemia, diabetes, gastric reflux, anxiety, developmental delay, presents the emergency department with lower abdominal discomfort. According to the patient for the past several days she has been having lower abdominal pain, and since yesterday she has been having dysuria. Denies a nausea or vomiting. Denies diarrhea. Denies fever. States moderate lower abdominal pain.     Past Medical History  Diagnosis Date  . Hypertension   . Diabetes mellitus without complication ([place])   . High cholesterol   . Chronic headaches   . GERD (gastroesophageal reflux disease)   . Anxiety   . Mental developmental delay   . Panic attacks     There are no active problems to display for this patient.   Past Surgical History  Procedure Laterality Date  . Hand surgery    . Kidney stone surgery      Current Outpatient Rx  Name  Route  Sig  Dispense  Refill  . albuterol (PROVENTIL HFA;VENTOLIN HFA) 108 (90 BASE) MCG/ACT inhaler   Inhalation   Inhale 2 puffs into the lungs every 6 (six) hours as needed for wheezing or shortness of breath.   1 Inhaler   2   . azithromycin (ZITHROMAX Z-PAK) 250 MG tablet      Take 2 tablets (500 mg) on  Day 1,  followed by 1 tablet (250 mg) once daily on Days 2 through 5.   6 each   0   . benzonatate (TESSALON PERLES) 100 MG capsule      Take 1 - 2 every 8 hours prn cough   30 capsule   0   . chlorpheniramine-HYDROcodone (TUSSIONEX PENNKINETIC ER) 10-8 MG/5ML SUER   Oral   Take 5 mLs by mouth 2 (two) times daily.   100 mL   0   . LORazepam (ATIVAN) 1 MG tablet   Oral   Take 1 tablet (1 mg total) by mouth every 8 (eight) hours as needed for  anxiety.   15 tablet   0   . naproxen (EC NAPROSYN) 500 MG EC tablet   Oral   Take 1 tablet (500 mg total) by mouth 2 (two) times daily with a meal.   30 tablet   0     Allergies Review of patient's allergies indicates no known allergies.  Family History  Problem Relation Age of Onset  . Breast cancer Mother 63    pre pt.     Social History Social History  Substance Use Topics  . Smoking status: Never Smoker   . Smokeless tobacco: Never Used  . Alcohol Use: No    Review of Systems Constitutional: Negative for fever. Cardiovascular: Negative for chest pain. Respiratory: Negative for shortness of breath. Gastrointestinal: Lower abdominal pain. Negative for nausea, vomiting, diarrhea. Genitourinary: Positive for dysuria Musculoskeletal: Negative for back pain. Neurological: Negative for headache 10-point ROS otherwise negative.  ____________________________________________   PHYSICAL EXAM:  VITAL SIGNS: ED Triage Vitals  Enc Vitals Group     BP 03/30/16 1832 149/108 mmHg     Pulse Rate 03/30/16 1832 64     Resp 03/30/16 1832 20     Temp 03/30/16 1832 98.8 F (37.1 C)     Temp  Source 03/30/16 1832 Oral     SpO2 03/30/16 1832 99 %     Weight 03/30/16 1832 177 lb (80.287 kg)     Height 03/30/16 1832 5\' 2"  (1.575 m)     Head Cir --      Peak Flow --      Pain Score 03/30/16 1834 9     Pain Loc --      Pain Edu? --      Excl. in [place]? --     Constitutional: Alert.  Well appearing and in no distress. Eyes: Normal exam ENT   Head: Normocephalic and atraumatic.   Mouth/Throat: Mucous membranes are moist. Cardiovascular: Normal rate, regular rhythm. No murmur Respiratory: Normal respiratory effort without tachypnea nor retractions. Breath sounds are clear Gastrointestinal: Soft however there is mild distention. Minimal suprapubic tenderness to palpation. No rebound or guarding. Moderate tympanic percussion Musculoskeletal: Nontender with normal range of  motion in all extremities.  Neurologic:  Normal speech and language. No gross focal neurologic deficit Skin:  Skin is warm, dry and intact.  Psychiatric: Mood and affect are normal.  ____________________________________________     RADIOLOGY  I have reviewed the images myself. Nonspecific bowel gas pattern. No signs of obstruction or free air.  ____________________________________________    INITIAL IMPRESSION / ASSESSMENT AND PLAN / ED COURSE  Pertinent labs & imaging results that were available during my care of the patient were reviewed by me and considered in my medical decision making (see chart for details).  Patient presents to the emergency department with lower abdominal discomfort and dysuria. Patient's lab work is largely within normal limits besides a nitrite-positive urine consistent with urinary tract infection. I have added on a urine culture to the patient's urinalysis. We'll treat with Keflex. Patient does have mild abdominal distention with tympanic percussion. Patient denies a feeling of distention. We'll obtain an abdominal x-ray to further evaluate.  X-ray largely within normal limits. Urinalysis consistent with urinary tract infection, labs otherwise within normal limits. We'll discharge home with Keflex. I have added a urine culture on to the patient's urinalysis. ____________________________________________   FINAL CLINICAL IMPRESSION(S) / ED DIAGNOSES  Urinary tract infection   Harvest Dark, MD 03/30/16 2233

## 2016-03-30 NOTE — Discharge Instructions (Signed)

## 2016-03-30 NOTE — ED Notes (Signed)
Patient presents to the ED with right lower quadrant abdominal pain since yesterday evening.  Per patient patient lives by herself in an apartment and is visited by staff at the [facility].  Patient states she took the bus to the ED today.  Patient denies vomiting and diarrhea.  Patient is also complaining of dysuria and right hip pain.  Patient reports a history of kidney stones.

## 2016-04-01 LAB — URINE CULTURE: Culture: >=100000 — AB

## 2016-04-18 ENCOUNTER — Emergency Department
Admission: EM | Admit: 2016-04-18 | Discharge: 2016-04-18 | Disposition: A | Discharge status: Home / Self Care | Payer: Medicare Other | Attending: Emergency Medicine | Admitting: Emergency Medicine

## 2016-04-18 ENCOUNTER — Emergency Department: Payer: Medicare Other

## 2016-04-18 DIAGNOSIS — E119 Type 2 diabetes mellitus without complications: Secondary | ICD-10-CM | POA: Insufficient documentation

## 2016-04-18 DIAGNOSIS — I10 Essential (primary) hypertension: Secondary | ICD-10-CM | POA: Diagnosis not present

## 2016-04-18 DIAGNOSIS — N390 Urinary tract infection, site not specified: Secondary | ICD-10-CM | POA: Diagnosis not present

## 2016-04-18 DIAGNOSIS — Z792 Long term (current) use of antibiotics: Secondary | ICD-10-CM | POA: Diagnosis not present

## 2016-04-18 DIAGNOSIS — Z79899 Other long term (current) drug therapy: Secondary | ICD-10-CM | POA: Insufficient documentation

## 2016-04-18 DIAGNOSIS — R109 Unspecified abdominal pain: Secondary | ICD-10-CM | POA: Diagnosis present

## 2016-04-18 DIAGNOSIS — Z791 Long term (current) use of non-steroidal anti-inflammatories (NSAID): Secondary | ICD-10-CM | POA: Insufficient documentation

## 2016-04-18 LAB — CBC
HEMATOCRIT: 38 % (ref 35.0–47.0)
HEMOGLOBIN: 13.1 g/dL (ref 12.0–16.0)
MCH: 29.9 pg (ref 26.0–34.0)
MCHC: 34.6 g/dL (ref 32.0–36.0)
MCV: 86.2 fL (ref 80.0–100.0)
Platelets: 277 10*3/uL (ref 150–440)
RBC: 4.4 MIL/uL (ref 3.80–5.20)
RDW: 14.1 % (ref 11.5–14.5)
WBC: 7.4 10*3/uL (ref 3.6–11.0)

## 2016-04-18 LAB — COMPREHENSIVE METABOLIC PANEL
ALK PHOS: 54 U/L (ref 38–126)
ALT: 18 U/L (ref 14–54)
AST: 14 U/L — ABNORMAL LOW (ref 15–41)
Albumin: 4.6 g/dL (ref 3.5–5.0)
Anion gap: 7 (ref 5–15)
BILIRUBIN TOTAL: 0.3 mg/dL (ref 0.3–1.2)
BUN: 15 mg/dL (ref 6–20)
CALCIUM: 9.5 mg/dL (ref 8.9–10.3)
CO2: 25 mmol/L (ref 22–32)
CREATININE: 0.54 mg/dL (ref 0.44–1.00)
Chloride: 108 mmol/L (ref 101–111)
Glucose, Bld: 93 mg/dL (ref 65–99)
Potassium: 3.8 mmol/L (ref 3.5–5.1)
Sodium: 140 mmol/L (ref 135–145)
TOTAL PROTEIN: 7.2 g/dL (ref 6.5–8.1)

## 2016-04-18 LAB — URINALYSIS COMPLETE WITH MICROSCOPIC (ARMC ONLY)
Bilirubin Urine: NEGATIVE
GLUCOSE, UA: NEGATIVE mg/dL
KETONES UR: NEGATIVE mg/dL
NITRITE: POSITIVE — AB
Protein, ur: 30 mg/dL — AB
Specific Gravity, Urine: 1.016 (ref 1.005–1.030)
pH: 5 (ref 5.0–8.0)

## 2016-04-18 LAB — POCT PREGNANCY, URINE: PREG TEST UR: NEGATIVE

## 2016-04-18 MED ORDER — ONDANSETRON 4 MG PO TBDP
4.0000 mg | ORAL_TABLET | Freq: Once | ORAL | Status: AC
  Administered 2016-04-18: 4 mg via ORAL
  Filled 2016-04-18: qty 1

## 2016-04-18 MED ORDER — TRAMADOL HCL 50 MG PO TABS
100.0000 mg | ORAL_TABLET | Freq: Once | ORAL | Status: AC
  Administered 2016-04-18: 100 mg via ORAL
  Filled 2016-04-18: qty 2

## 2016-04-18 MED ORDER — CEPHALEXIN 500 MG PO CAPS
500.0000 mg | ORAL_CAPSULE | Freq: Once | ORAL | Status: AC
  Administered 2016-04-18: 500 mg via ORAL
  Filled 2016-04-18: qty 1

## 2016-04-18 MED ORDER — ONDANSETRON 4 MG PO TBDP: 4.0000 mg | ORAL_TABLET | Freq: Three times a day (TID) | ORAL | Status: DC | PRN

## 2016-04-18 MED ORDER — TRAMADOL HCL 50 MG PO TABS: 50.0000 mg | ORAL_TABLET | Freq: Four times a day (QID) | ORAL | Status: AC | PRN

## 2016-04-18 MED ORDER — CEPHALEXIN 500 MG PO CAPS: 500.0000 mg | ORAL_CAPSULE | Freq: Three times a day (TID) | ORAL | Status: DC

## 2016-04-18 NOTE — ED Notes (Signed)
Pt states she has had back pain and a fever for several days. Pt states she has had kidney infections in the past and she feels the symptoms are similar.

## 2016-04-18 NOTE — Discharge Instructions (Signed)
Please take your medications as prescribed. Please return to the emergency department for any worsening pain, fever, or any other symptom personally concerning to her self. Otherwise please follow-up with your primary care physician in one to 2 days for recheck/reevaluation.   Urinary Tract Infection Urinary tract infections (UTIs) can develop anywhere along your urinary tract. Your urinary tract is your body's drainage system for removing wastes and extra water. Your urinary tract includes two kidneys, two ureters, a bladder, and a urethra. Your kidneys are a pair of bean-shaped organs. Each kidney is about the size of your fist. They are located below your ribs, one on each side of your spine. CAUSES Infections are caused by microbes, which are microscopic organisms, including fungi, viruses, and bacteria. These organisms are so small that they can only be seen through a microscope. Bacteria are the microbes that most commonly cause UTIs. SYMPTOMS  Symptoms of UTIs may vary by age and gender of the patient and by the location of the infection. Symptoms in young women typically include a frequent and intense urge to urinate and a painful, burning feeling in the bladder or urethra during urination. Older women and men are more likely to be tired, shaky, and weak and have muscle aches and abdominal pain. A fever may mean the infection is in your kidneys. Other symptoms of a kidney infection include pain in your back or sides below the ribs, nausea, and vomiting. DIAGNOSIS To diagnose a UTI, your caregiver will ask you about your symptoms. Your caregiver will also ask you to provide a urine sample. The urine sample will be tested for bacteria and white blood cells. White blood cells are made by your body to help fight infection. TREATMENT  Typically, UTIs can be treated with medication. Because most UTIs are caused by a bacterial infection, they usually can be treated with the use of antibiotics. The  choice of antibiotic and length of treatment depend on your symptoms and the type of bacteria causing your infection. HOME CARE INSTRUCTIONS  If you were prescribed antibiotics, take them exactly as your caregiver instructs you. Finish the medication even if you feel better after you have only taken some of the medication.  Drink enough water and fluids to keep your urine clear or pale yellow.  Avoid caffeine, tea, and carbonated beverages. They tend to irritate your bladder.  Empty your bladder often. Avoid holding urine for long periods of time.  Empty your bladder before and after sexual intercourse.  After a bowel movement, women should cleanse from front to back. Use each tissue only once. SEEK MEDICAL CARE IF:   You have back pain.  You develop a fever.  Your symptoms do not begin to resolve within 3 days. SEEK IMMEDIATE MEDICAL CARE IF:   You have severe back pain or lower abdominal pain.  You develop chills.  You have nausea or vomiting.  You have continued burning or discomfort with urination. MAKE SURE YOU:   Understand these instructions.  Will watch your condition.  Will get help right away if you are not doing well or get worse.   This information is not intended to replace advice given to you by your health care provider. Make sure you discuss any questions you have with your health care provider.   Document Released: 08/30/2005 Document Revised: 08/11/2015 Document Reviewed: 12/29/2011 Elsevier Interactive Patient Education [employer].

## 2016-04-18 NOTE — ED Provider Notes (Signed)
[facility] Emergency Department Provider Note  Time seen: 3:52 AM  I have reviewed the triage vital signs and the nursing notes.   HISTORY  Chief Complaint Back Pain and Abdominal Pain    HPI Hannah Clarke is a 50 y.o. female with a past medical history of hypertension, hyperlipidemia, diabetes, gastric reflux, developmental delay, presents the emergency department with right flank pain and lower abdominal pain. According to the patient for the past one week she has not been feeling well which she describes as increased fatigue/weakness along with occasional nausea. Patient states right flank pain/right back pain along with lower abdominal pain. History of frequent UTIs in the past. Also a history of kidney stones. States she has been getting chills, but does not know if she's had a fever. Describes her pain as moderate, dull pain.     Past Medical History  Diagnosis Date  . Hypertension   . Diabetes mellitus without complication ([place])   . High cholesterol   . Chronic headaches   . GERD (gastroesophageal reflux disease)   . Anxiety   . Mental developmental delay   . Panic attacks     There are no active problems to display for this patient.   Past Surgical History  Procedure Laterality Date  . Hand surgery    . Kidney stone surgery      Current Outpatient Rx  Name  Route  Sig  Dispense  Refill  . albuterol (PROVENTIL HFA;VENTOLIN HFA) 108 (90 BASE) MCG/ACT inhaler   Inhalation   Inhale 2 puffs into the lungs every 6 (six) hours as needed for wheezing or shortness of breath.   1 Inhaler   2   . azithromycin (ZITHROMAX Z-PAK) 250 MG tablet      Take 2 tablets (500 mg) on  Day 1,  followed by 1 tablet (250 mg) once daily on Days 2 through 5.   6 each   0   . benzonatate (TESSALON PERLES) 100 MG capsule      Take 1 - 2 every 8 hours prn cough   30 capsule   0   . cephALEXin (KEFLEX) 500 MG capsule   Oral   Take 1 capsule (500 mg  total) by mouth 3 (three) times daily.   30 capsule   0   . chlorpheniramine-HYDROcodone (TUSSIONEX PENNKINETIC ER) 10-8 MG/5ML SUER   Oral   Take 5 mLs by mouth 2 (two) times daily.   100 mL   0   . LORazepam (ATIVAN) 1 MG tablet   Oral   Take 1 tablet (1 mg total) by mouth every 8 (eight) hours as needed for anxiety.   15 tablet   0   . naproxen (EC NAPROSYN) 500 MG EC tablet   Oral   Take 1 tablet (500 mg total) by mouth 2 (two) times daily with a meal.   30 tablet   0   . polyethylene glycol powder (GLYCOLAX/MIRALAX) powder   Oral   Take 17 g by mouth daily.   255 g   0     Allergies Review of patient's allergies indicates no known allergies.  Family History  Problem Relation Age of Onset  . Breast cancer Mother 24    pre pt.     Social History Social History  Substance Use Topics  . Smoking status: Never Smoker   . Smokeless tobacco: Never Used  . Alcohol Use: No    Review of Systems Constitutional: Negative for  fever.Positive for chills. Cardiovascular: Negative for chest pain. Respiratory: Negative for shortness of breath. Gastrointestinal: Positive for right flank pain. Positive for nausea. Denies vomiting or diarrhea. Genitourinary: Negative for dysuria. Musculoskeletal: Right back pain. Neurological: Negative for headache 10-point ROS otherwise negative.  ____________________________________________   PHYSICAL EXAM:  VITAL SIGNS: ED Triage Vitals  Enc Vitals Group     BP 04/18/16 0012 151/84 mmHg     Pulse Rate 04/18/16 0012 56     Resp 04/18/16 0012 18     Temp 04/18/16 0012 98.5 F (36.9 C)     Temp Source 04/18/16 0012 Oral     SpO2 04/18/16 0012 100 %     Weight 04/18/16 0012 177 lb (80.287 kg)     Height 04/18/16 0012 5\' 2"  (1.575 m)     Head Cir --      Peak Flow --      Pain Score 04/18/16 0016 8     Pain Loc --      Pain Edu? --      Excl. in [place]? --     Constitutional: Alert and oriented. Well appearing and in no  distress. Eyes: Normal exam ENT   Head: Normocephalic and atraumatic.   Mouth/Throat: Mucous membranes are moist. Cardiovascular: Normal rate, regular rhythm. No murmur Respiratory: Normal respiratory effort without tachypnea nor retractions. Breath sounds are clear  Gastrointestinal: Soft, mild right sided and suprapubic tenderness to palpation no rebound or guarding. No distention. Musculoskeletal: Nontender with normal range of motion in all extremities.  Neurologic:  Normal speech and language. No gross focal neurologic deficits  Skin:  Skin is warm, dry and intact.  Psychiatric: Mood and affect are normal.  ____________________________________________    INITIAL IMPRESSION / ASSESSMENT AND PLAN / ED COURSE  Pertinent labs & imaging results that were available during my care of the patient were reviewed by me and considered in my medical decision making (see chart for details).  The patient presents the emergency department right flank pain/suprapubic pain. Patient also feeling nauseated with generalized weakness for the past one week. Patient's labs are consistent with a urinary tract infection, otherwise they're within normal limits. Given her history of kidney stones and pain located mostly on the right side we'll obtain a CT renal scan to rule out ureterolithiasis. We'll place the patient on Keflex, and send a urine culture.  CT negative for ureterolithiasis. We will discharge the patient with a short course of pain medication, Keflex, and Zofran. Patient is to follow-up with a primary care physician in the next 1-2 days. I discussed return precautions to which the patient is agreeable. ____________________________________________   FINAL CLINICAL IMPRESSION(S) / ED DIAGNOSES  Urinary tract infection Flank pain, right   Harvest Dark, MD 04/18/16 [phone]

## 2016-04-18 NOTE — ED Notes (Signed)
Patient ambulatory to triage with steady gait, without difficulty or distress noted; pt reports lower back/abd pain with dysuria this week

## 2016-04-20 LAB — URINE CULTURE

## 2016-06-12 ENCOUNTER — Encounter: Payer: Self-pay | Admitting: Emergency Medicine

## 2016-06-12 ENCOUNTER — Emergency Department
Admission: EM | Admit: 2016-06-12 | Discharge: 2016-06-12 | Disposition: A | Discharge status: Home / Self Care | Payer: Medicare Other | Attending: Emergency Medicine | Admitting: Emergency Medicine

## 2016-06-12 ENCOUNTER — Emergency Department: Payer: Medicare Other

## 2016-06-12 DIAGNOSIS — R110 Nausea: Secondary | ICD-10-CM | POA: Diagnosis not present

## 2016-06-12 DIAGNOSIS — K591 Functional diarrhea: Secondary | ICD-10-CM | POA: Insufficient documentation

## 2016-06-12 DIAGNOSIS — Z791 Long term (current) use of non-steroidal anti-inflammatories (NSAID): Secondary | ICD-10-CM | POA: Insufficient documentation

## 2016-06-12 DIAGNOSIS — Z792 Long term (current) use of antibiotics: Secondary | ICD-10-CM | POA: Insufficient documentation

## 2016-06-12 DIAGNOSIS — I10 Essential (primary) hypertension: Secondary | ICD-10-CM | POA: Insufficient documentation

## 2016-06-12 DIAGNOSIS — R1031 Right lower quadrant pain: Secondary | ICD-10-CM | POA: Diagnosis not present

## 2016-06-12 DIAGNOSIS — R1032 Left lower quadrant pain: Secondary | ICD-10-CM

## 2016-06-12 DIAGNOSIS — R300 Dysuria: Secondary | ICD-10-CM | POA: Insufficient documentation

## 2016-06-12 DIAGNOSIS — E119 Type 2 diabetes mellitus without complications: Secondary | ICD-10-CM | POA: Insufficient documentation

## 2016-06-12 DIAGNOSIS — Z79899 Other long term (current) drug therapy: Secondary | ICD-10-CM | POA: Insufficient documentation

## 2016-06-12 LAB — URINALYSIS COMPLETE WITH MICROSCOPIC (ARMC ONLY)
BACTERIA UA: NONE SEEN
BILIRUBIN URINE: NEGATIVE
Glucose, UA: NEGATIVE mg/dL
Ketones, ur: NEGATIVE mg/dL
LEUKOCYTES UA: NEGATIVE
Nitrite: NEGATIVE
PH: 5 (ref 5.0–8.0)
PROTEIN: 30 mg/dL — AB
Specific Gravity, Urine: 1.019 (ref 1.005–1.030)

## 2016-06-12 LAB — COMPREHENSIVE METABOLIC PANEL
ALBUMIN: 4.7 g/dL (ref 3.5–5.0)
ALK PHOS: 61 U/L (ref 38–126)
ALT: 18 U/L (ref 14–54)
ANION GAP: 9 (ref 5–15)
AST: 16 U/L (ref 15–41)
BILIRUBIN TOTAL: 0.5 mg/dL (ref 0.3–1.2)
BUN: 9 mg/dL (ref 6–20)
CO2: 23 mmol/L (ref 22–32)
Calcium: 9 mg/dL (ref 8.9–10.3)
Chloride: 108 mmol/L (ref 101–111)
Creatinine, Ser: 0.59 mg/dL (ref 0.44–1.00)
GFR calc Af Amer: >60 mL/min (ref >60)
GFR calc non Af Amer: >60 mL/min (ref >60)
GLUCOSE: 128 mg/dL — AB (ref 65–99)
Potassium: 3.6 mmol/L (ref 3.5–5.1)
Sodium: 140 mmol/L (ref 135–145)
Total Protein: 7.2 g/dL (ref 6.5–8.1)

## 2016-06-12 LAB — CBC
HEMATOCRIT: 39.4 % (ref 35.0–47.0)
HEMOGLOBIN: 13.4 g/dL (ref 12.0–16.0)
MCH: 29.6 pg (ref 26.0–34.0)
MCHC: 34 g/dL (ref 32.0–36.0)
MCV: 87 fL (ref 80.0–100.0)
PLATELETS: 254 10*3/uL (ref 150–440)
RBC: 4.53 MIL/uL (ref 3.80–5.20)
RDW: 13.5 % (ref 11.5–14.5)
WBC: 5.8 10*3/uL (ref 3.6–11.0)

## 2016-06-12 LAB — PREGNANCY, URINE: Preg Test, Ur: NEGATIVE

## 2016-06-12 LAB — LIPASE, BLOOD: Lipase: 15 U/L (ref 11–51)

## 2016-06-12 MED ORDER — LOPERAMIDE HCL 2 MG PO TABS: 2.0000 mg | ORAL_TABLET | Freq: Four times a day (QID) | ORAL | Status: DC | PRN

## 2016-06-12 MED ORDER — KETOROLAC TROMETHAMINE 30 MG/ML IJ SOLN
30.0000 mg | Freq: Once | INTRAMUSCULAR | Status: AC
  Administered 2016-06-12: 30 mg via INTRAVENOUS
  Filled 2016-06-12: qty 1

## 2016-06-12 MED ORDER — ONDANSETRON 4 MG PO TBDP: 4.0000 mg | ORAL_TABLET | Freq: Three times a day (TID) | ORAL | Status: DC | PRN

## 2016-06-12 MED ORDER — SODIUM CHLORIDE 0.9 % IV BOLUS (SEPSIS)
1000.0000 mL | Freq: Once | INTRAVENOUS | Status: AC
  Administered 2016-06-12: 1000 mL via INTRAVENOUS

## 2016-06-12 MED ORDER — ONDANSETRON HCL 4 MG/2ML IJ SOLN
4.0000 mg | Freq: Once | INTRAMUSCULAR | Status: AC
  Administered 2016-06-12: 4 mg via INTRAVENOUS
  Filled 2016-06-12: qty 2

## 2016-06-12 NOTE — ED Notes (Signed)
Patient presents to the ED with lower abdominal pain, and a couple episodes of loose stools.  Patient denies vomiting.  Patient states she lives at [facility] apartment independently.  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  Patient states she does not want to call anyone to inform them of her presence in the ED at this time.  Patient took a taxi to the ED.  Patient has developmental delays.  Patient reports not feeling well the past 2 weeks.  Patient reports recent UTI and recent kidney stones.  Patient tearful in triage when this RN suggested calling someone to inform them she is in the ED.

## 2016-06-12 NOTE — ED Notes (Signed)
Pt sts that pain feels like kidney stone pain.  Pt sts that she was dx w/ kidney stones but does not believe they have passed.  Pt sts she has not been drinking water today, but been drinking soda.  C/o pain for "last couple of weeks".

## 2016-06-12 NOTE — ED Notes (Addendum)
Pt sts that she will take cab home.  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

## 2016-06-12 NOTE — ED Provider Notes (Signed)
[facility] Emergency Department Provider Note  ____________________________________________  Time seen: Approximately 7:29 PM  I have reviewed the triage vital signs and the nursing notes.   HISTORY  Chief Complaint Abdominal Pain    HPI Hannah Clarke is a 50 y.o. female history of renal stones,DM, and developed mental delay presenting with bilateral lower abdominal pain, diarrhea and nausea. The patient reports that yesterday she was feeling poorly, and then today her symptoms worsened while she was at a baseball game. She describes a cramping sensation from the bilateral flanks radiating down towards the pelvis that is similar to pain she has had with kidney stones in the past. She has had associated dysuria and hematuria. She did several episodes of nonbloody diarrhea and nausea without vomiting. No fever or chills. She is not sexually active and has had no change in her vaginal discharge.   Past Medical History  Diagnosis Date  . Hypertension   . Diabetes mellitus without complication ([place])   . High cholesterol   . Chronic headaches   . GERD (gastroesophageal reflux disease)   . Anxiety   . Mental developmental delay   . Panic attacks     There are no active problems to display for this patient.   Past Surgical History  Procedure Laterality Date  . Hand surgery    . Kidney stone surgery      Current Outpatient Rx  Name  Route  Sig  Dispense  Refill  . albuterol (PROVENTIL HFA;VENTOLIN HFA) 108 (90 BASE) MCG/ACT inhaler   Inhalation   Inhale 2 puffs into the lungs every 6 (six) hours as needed for wheezing or shortness of breath.   1 Inhaler   2   . azithromycin (ZITHROMAX Z-PAK) 250 MG tablet      Take 2 tablets (500 mg) on  Day 1,  followed by 1 tablet (250 mg) once daily on Days 2 through 5.   6 each   0   . benzonatate (TESSALON PERLES) 100 MG capsule      Take 1 - 2 every 8 hours prn cough   30 capsule   0   . cephALEXin  (KEFLEX) 500 MG capsule   Oral   Take 1 capsule (500 mg total) by mouth 3 (three) times daily.   30 capsule   0   . chlorpheniramine-HYDROcodone (TUSSIONEX PENNKINETIC ER) 10-8 MG/5ML SUER   Oral   Take 5 mLs by mouth 2 (two) times daily.   100 mL   0   . loperamide (IMODIUM A-D) 2 MG tablet   Oral   Take 1 tablet (2 mg total) by mouth 4 (four) times daily as needed for diarrhea or loose stools.   12 tablet   0   . LORazepam (ATIVAN) 1 MG tablet   Oral   Take 1 tablet (1 mg total) by mouth every 8 (eight) hours as needed for anxiety.   15 tablet   0   . naproxen (EC NAPROSYN) 500 MG EC tablet   Oral   Take 1 tablet (500 mg total) by mouth 2 (two) times daily with a meal.   30 tablet   0   . ondansetron (ZOFRAN ODT) 4 MG disintegrating tablet   Oral   Take 1 tablet (4 mg total) by mouth every 8 (eight) hours as needed for nausea or vomiting.   20 tablet   0   . polyethylene glycol powder (GLYCOLAX/MIRALAX) powder   Oral   Take  17 g by mouth daily.   255 g   0   . traMADol (ULTRAM) 50 MG tablet   Oral   Take 1 tablet (50 mg total) by mouth every 6 (six) hours as needed.   12 tablet   0     Allergies Review of patient's allergies indicates no known allergies.  Family History  Problem Relation Age of Onset  . Breast cancer Mother 61    pre pt.     Social History Social History  Substance Use Topics  . Smoking status: Never Smoker   . Smokeless tobacco: Never Used  . Alcohol Use: No    Review of Systems Constitutional: No fever/chills. Eyes: No visual changes. ENT: No sore throat. No congestion or rhinorrhea. Cardiovascular: Denies chest pain. Denies palpitations. Respiratory: Denies shortness of breath.  No cough. Gastrointestinal: As of bilateral lower abdominal pain.  Positive nausea, no vomiting.  Positive diarrhea.  No constipation. Genitourinary: Positive for dysuria. Musculoskeletal: Negative for back pain. Skin: Negative for  rash. Neurological: Negative for headaches. No focal numbness, tingling or weakness.   10-point ROS otherwise negative.  ____________________________________________   PHYSICAL EXAM:  VITAL SIGNS: ED Triage Vitals  Enc Vitals Group     BP 06/12/16 1825 138/96 mmHg     Pulse Rate 06/12/16 1825 67     Resp 06/12/16 1825 18     Temp 06/12/16 1825 98.3 F (36.8 C)     Temp Source 06/12/16 1825 Oral     SpO2 06/12/16 1825 99 %     Weight 06/12/16 1825 170 lb (77.111 kg)     Height 06/12/16 1825 5' (1.524 m)     Head Cir --      Peak Flow --      Pain Score --      Pain Loc --      Pain Edu? --      Excl. in [place]? --     Constitutional: Patient is alert and answering questions appropriately. She does have some development of delay with some speech difficulty, but we are able to understand each other well. Eyes: Conjunctivae are normal.  EOMI. No scleral icterus. Head: Atraumatic. Nose: No congestion/rhinnorhea. Mouth/Throat: Mucous membranes are dry.  Neck: No stridor.  Supple.   Cardiovascular: Normal rate, regular rhythm. No murmurs, rubs or gallops.  Respiratory: Normal respiratory effort.  No accessory muscle use or retractions. Lungs CTAB.  No wheezes, rales or ronchi. Gastrointestinal: Soft and nondistended.  Diffuse lower abdominal tenderness to palpation without focality. No guarding or rebound.  No peritoneal signs. No focal CVA tenderness. Musculoskeletal: No LE edema.  Neurologic:  A&Ox3.  Speech is clear.  Face and smile are symmetric.  EOMI.  Moves all extremities well. Skin:  Skin is warm, dry and intact. No rash noted. Psychiatric: Mood and affect are normal. Speech and behavior are normal.  Normal judgement.  ____________________________________________   LABS (all labs ordered are listed, but only abnormal results are displayed)  Labs Reviewed  COMPREHENSIVE METABOLIC PANEL - Abnormal; Notable for the following:    Glucose, Bld 128 (*)    All other  components within normal limits  URINALYSIS COMPLETEWITH MICROSCOPIC ([facility] ONLY) - Abnormal; Notable for the following:    Color, Urine YELLOW (*)    APPearance CLOUDY (*)    Hgb urine dipstick 2+ (*)    Protein, ur 30 (*)    Squamous Epithelial / LPF 0-5 (*)    All other components within normal limits  LIPASE, BLOOD  CBC  PREGNANCY, URINE   ____________________________________________  EKG  Not indicated ____________________________________________  RADIOLOGY  Ct Renal Stone Study  06/12/2016  CLINICAL DATA:  Lower abdominal pain and diarrhea. The patient has felt ill for the past 2 weeks. History of kidney stones and recent urinary tract infection. EXAM: CT ABDOMEN AND PELVIS WITHOUT CONTRAST TECHNIQUE: Multidetector CT imaging of the abdomen and pelvis was performed following the standard protocol without IV contrast. COMPARISON:  CT abdomen and pelvis 04/18/2016. FINDINGS: No pleural or pericardial effusion. Small hiatal hernia is noted. The lung bases are clear. An unchanged 0.5 cm nonobstructing stone is seen in the lower pole of the left kidney. A punctate nonobstructing stone is also identified in the lower pole of the left kidney. A 0.3 cm stone is seen in the upper pole of the left kidney. There is cortical thinning overlying the calices of the left kidney suggestive of chronic vesicoureteral reflux, unchanged. Compensatory hypertrophy of the right kidney is noted. The right kidney is otherwise unremarkable. The gallbladder, liver, spleen and adrenal glands appear normal. Fatty replacement of the pancreas is unchanged. The uterus and adnexa are unremarkable. Urinary bladder appears normal. Small and large bowel and appendix appear normal. There is no lymphadenopathy or fluid. No focal bony abnormality is identified. IMPRESSION: No acute abnormality abdomen or pelvis. No change in nonobstructing left renal stones. Left renal scarring in a pattern suggestive of chronic vesicoureteral  reflux is unchanged. Electronically Signed   By: Inge Rise M.D.   On: 06/12/2016 20:36    ____________________________________________   PROCEDURES  Procedure(s) performed: None  Critical Care performed: No ____________________________________________   INITIAL IMPRESSION / ASSESSMENT AND PLAN / ED COURSE  Pertinent labs & imaging results that were available during my care of the patient were reviewed by me and considered in my medical decision making (see chart for details).  50 y.o. female with a history of developmental delay, DM, and renal colic presenting with bilateral lower abdominal pain radiating from the flanks associate with diarrhea and nausea without vomiting and that is similar to previous renal colic. She does have some associated dysuria as well. We will check her for UTI, evaluate for kidney stone. I would also consider a viral GI illness, or some food borne diarrheal illnesses well.  ----------------------------------------- 8:58 PM on 06/12/2016 -----------------------------------------  The patient is feeling better at this time. She is tolerating by mouth. Her CT scan does not show any ureteral kidney stones. She does not have pyelonephritis or UTI. It is likely that her abdominal cramping was from her diarrhea, and she may have either a viral GI illness or foodborne illness. I will send her home with symptom at a treatment. She has been instructed to make an appointment with her primary care physician in the next 1-2 days patient understands return precautions as well as follow-up instructions. ____________________________________________  FINAL CLINICAL IMPRESSION(S) / ED DIAGNOSES  Final diagnoses:  Bilateral lower abdominal pain  Functional diarrhea  Nausea without vomiting      NEW MEDICATIONS STARTED DURING THIS VISIT:  New Prescriptions   LOPERAMIDE (IMODIUM A-D) 2 MG TABLET    Take 1 tablet (2 mg total) by mouth 4 (four) times daily as  needed for diarrhea or loose stools.     Eula Listen, MD 06/12/16 2059

## 2016-06-12 NOTE — Discharge Instructions (Signed)
Please take a BRAT diet as described in the paperwork for the next 48 hours, then advance your diet as tolerated. You may take Zofran for nausea and vomiting, and loperamide for diarrhea.  Return to the emergency department if you develop severe pain, fever, inability to keep down fluids, lightheadedness or fainting, or any other symptoms concerning to you.

## 2016-07-17 ENCOUNTER — Encounter: Payer: Self-pay | Admitting: Emergency Medicine

## 2016-07-17 DIAGNOSIS — I10 Essential (primary) hypertension: Secondary | ICD-10-CM | POA: Diagnosis not present

## 2016-07-17 DIAGNOSIS — E119 Type 2 diabetes mellitus without complications: Secondary | ICD-10-CM | POA: Diagnosis not present

## 2016-07-17 DIAGNOSIS — F410 Panic disorder [episodic paroxysmal anxiety] without agoraphobia: Secondary | ICD-10-CM | POA: Diagnosis present

## 2016-07-17 DIAGNOSIS — Z79899 Other long term (current) drug therapy: Secondary | ICD-10-CM | POA: Insufficient documentation

## 2016-07-17 DIAGNOSIS — F419 Anxiety disorder, unspecified: Secondary | ICD-10-CM | POA: Insufficient documentation

## 2016-07-17 DIAGNOSIS — F430 Acute stress reaction: Secondary | ICD-10-CM | POA: Diagnosis not present

## 2016-07-17 NOTE — ED Triage Notes (Addendum)
Pt reports [place] from [place] attack on 07[place]12/16. Pt reports has been having panic attack since then. Pt reports was given medicine for anxiety by her staff today. Pt reports still feeling anxious.

## 2016-07-18 ENCOUNTER — Emergency Department
Admission: EM | Admit: 2016-07-18 | Discharge: 2016-07-18 | Disposition: A | Discharge status: Home / Self Care | Payer: Medicare Other | Attending: Emergency Medicine | Admitting: Emergency Medicine

## 2016-07-18 DIAGNOSIS — F419 Anxiety disorder, unspecified: Secondary | ICD-10-CM | POA: Diagnosis not present

## 2016-07-18 DIAGNOSIS — F430 Acute stress reaction: Secondary | ICD-10-CM

## 2016-07-18 MED ORDER — LORAZEPAM 2 MG/ML IJ SOLN
1.0000 mg | Freq: Once | INTRAMUSCULAR | Status: AC
  Administered 2016-07-18: 1 mg via INTRAMUSCULAR
  Filled 2016-07-18: qty 1

## 2016-07-18 NOTE — ED Notes (Signed)
Pt notified this RN that she was ready to be discharged and will call for a ride. Pt states she plans to keep her Dr's appt that is already scheduled. Pt states that she does not want to wait to see the psych doctor. No SI or HI at time of discharge.

## 2016-07-18 NOTE — ED Provider Notes (Signed)
[facility] Emergency Department Provider Note   ____________________________________________   I have reviewed the triage vital signs and the nursing notes.   HISTORY  Chief Complaint Panic Attack   History limited by: Not Limited   HPI Hannah Clarke is a 50 y.o. female with history of developmental delay, panic attacks who presents to the emergency department tonight because of increasing anxiety and panic attack. They have been increasing for the past week. She states it started after witnessing a man have a heart attack and die at a camp she was participating in. She also states that she has been thinking about her father and brother who have also died. The patient was given a medication (she thinks xanax) from her staff today without any great relief. She says she does have an appointment scheduled on Wednesday.    Past Medical History:  Diagnosis Date  . Anxiety   . Chronic headaches   . Diabetes mellitus without complication ([place])   . GERD (gastroesophageal reflux disease)   . High cholesterol   . Hypertension   . Mental developmental delay   . Panic attacks     There are no active problems to display for this patient.   Past Surgical History:  Procedure Laterality Date  . HAND SURGERY    . KIDNEY STONE SURGERY      Prior to Admission medications   Medication Sig Start Date End Date Taking? Authorizing Provider  albuterol (PROVENTIL HFA;VENTOLIN HFA) 108 (90 BASE) MCG/ACT inhaler Inhale 2 puffs into the lungs every 6 (six) hours as needed for wheezing or shortness of breath. 11/07/15   Orbie Pyo, MD  azithromycin (ZITHROMAX Z-PAK) 250 MG tablet Take 2 tablets (500 mg) on  Day 1,  followed by 1 tablet (250 mg) once daily on Days 2 through 5. 07/03/15   Johnn Hai, PA-C  benzonatate (TESSALON PERLES) 100 MG capsule Take 1 - 2 every 8 hours prn cough 07/03/15   Johnn Hai, PA-C  cephALEXin (KEFLEX) 500 MG capsule Take  1 capsule (500 mg total) by mouth 3 (three) times daily. 04/18/16   Harvest Dark, MD  chlorpheniramine-HYDROcodone [facility] PENNKINETIC ER) 10-8 MG/5ML SUER Take 5 mLs by mouth 2 (two) times daily. 07/11/15   Loney Hering, MD  loperamide (IMODIUM A-D) 2 MG tablet Take 1 tablet (2 mg total) by mouth 4 (four) times daily as needed for diarrhea or loose stools. 06/12/16   Anne-Caroline Mariea Clonts, MD  LORazepam (ATIVAN) 1 MG tablet Take 1 tablet (1 mg total) by mouth every 8 (eight) hours as needed for anxiety. 02/03/16   Paulette Blanch, MD  naproxen (EC NAPROSYN) 500 MG EC tablet Take 1 tablet (500 mg total) by mouth 2 (two) times daily with a meal. 06/12/15   Jenise V Bacon Menshew, PA-C  ondansetron (ZOFRAN ODT) 4 MG disintegrating tablet Take 1 tablet (4 mg total) by mouth every 8 (eight) hours as needed for nausea or vomiting. 06/12/16   Eula Listen, MD  polyethylene glycol powder (GLYCOLAX/MIRALAX) powder Take 17 g by mouth daily. 03/30/16   Harvest Dark, MD  traMADol (ULTRAM) 50 MG tablet Take 1 tablet (50 mg total) by mouth every 6 (six) hours as needed. 04/18/16 04/18/17  Harvest Dark, MD    Allergies Review of patient's allergies indicates no known allergies.  Family History  Problem Relation Age of Onset  . Breast cancer Mother 84    pre pt.     Social History  Social History  Substance Use Topics  . Smoking status: Never Smoker  . Smokeless tobacco: Never Used  . Alcohol use No    Review of Systems  Constitutional: Negative for fever. Cardiovascular: Negative for chest pain. Respiratory: Negative for shortness of breath. Gastrointestinal: Negative for abdominal pain, vomiting and diarrhea. Neurological: Negative for headaches, focal weakness or numbness.  10-point ROS otherwise negative.  ____________________________________________   PHYSICAL EXAM:  VITAL SIGNS: ED Triage Vitals  Enc Vitals Group     BP 07/17/16 2134 118/75     Pulse Rate 07/17/16  2134 69     Resp --      Temp 07/17/16 2134 98.5 F (36.9 C)     Temp Source 07/17/16 2134 Oral     SpO2 07/17/16 2134 97 %     Weight 07/17/16 2134 177 lb (80.3 kg)     Height 07/17/16 2134 5\' 2"  (1.575 m)     Head Circumference --      Peak Flow --      Pain Score 07/17/16 2156 9   Constitutional: Alert and oriented. Well appearing and in no distress. Eyes: Conjunctivae are normal. PERRL. Normal extraocular movements. ENT   Head: Normocephalic and atraumatic.   Nose: No congestion/rhinnorhea.   Mouth/Throat: Mucous membranes are moist.   Neck: No stridor. Hematological/Lymphatic/Immunilogical: No cervical lymphadenopathy. Cardiovascular: Normal rate, regular rhythm.  No murmurs, rubs, or gallops. Respiratory: Normal respiratory effort without tachypnea nor retractions. Breath sounds are clear and equal bilaterally. No wheezes/rales/rhonchi. Gastrointestinal: Soft and nontender. No distention.  Genitourinary: Deferred Musculoskeletal: Normal range of motion in all extremities. No joint effusions.  No lower extremity tenderness nor edema. Neurologic:  Normal speech and language. No gross focal neurologic deficits are appreciated.  Skin:  Skin is warm, dry and intact. No rash noted.  ____________________________________________    LABS (pertinent positives/negatives)  Labs Reviewed - No data to display   ____________________________________________   EKG  None  ____________________________________________    RADIOLOGY  None   ____________________________________________   PROCEDURES  Procedures  ____________________________________________   INITIAL IMPRESSION / ASSESSMENT AND PLAN / ED COURSE  Pertinent labs & imaging results that were available during my care of the patient were reviewed by me and considered in my medical decision making (see chart for details).  Patient presented because of increasing anxiety after witnessing someone  passing away from a heart attack. The patient on exam is calm. No SI. Patient however with worsening anxiety, panic attacks and difficulty sleeping. Will have patient evaluated by psychiatry. ____________________________________________   FINAL CLINICAL IMPRESSION(S) / ED DIAGNOSES  Anxiety  Note: This dictation was prepared with Dragon dictation. Any transcriptional errors that result from this process are unintentional    Nance Pear, MD 07/18/16 [id number]

## 2016-07-18 NOTE — ED Notes (Signed)
Pt. Declines food or hydration, denies toileting needs. Pt. Advised to call for RN with any concerns. Will continue to monitor.

## 2016-07-18 NOTE — ED Notes (Signed)
Attempted to call contact person for pt. No answer at [phone], VMM left to return call to the ED to relay information about pt and for d/c planning.

## 2016-07-18 NOTE — ED Notes (Signed)
Pt states she took a taxi cab to the ED this evening and will take a taxi home following d/c.

## 2016-07-18 NOTE — ED Notes (Signed)
Awaiting to see psych for anxiety issues. Pt with no acute distress noted, pt in hallway bed, vss.

## 2016-07-18 NOTE — ED Provider Notes (Signed)
 -----------------------------------------   9:20 AM on 07/18/2016 -----------------------------------------  Assumed care from Dr. Archie Balboa this morning pending psych consult. However patient reports that she is unwilling to wait any longer for psych consult. She does have an appointment with her psychiatrist tomorrow. She feels well, not in distress, calm, no SI HI or hallucinations. We'll discharge home to follow up with psychiatry tomorrow.   [name], MD 07/18/16 [phone]

## 2016-08-30 ENCOUNTER — Other Ambulatory Visit: Payer: Self-pay | Admitting: Nurse Practitioner

## 2016-08-30 DIAGNOSIS — Z1231 Encounter for screening mammogram for malignant neoplasm of breast: Secondary | ICD-10-CM

## 2016-10-02 ENCOUNTER — Ambulatory Visit
Admission: RE | Admit: 2016-10-02 | Discharge: 2016-10-02 | Disposition: A | Discharge status: Home / Self Care | Payer: Medicare Other | Source: Ambulatory Visit | Attending: Nurse Practitioner | Admitting: Nurse Practitioner

## 2016-10-02 DIAGNOSIS — Z1231 Encounter for screening mammogram for malignant neoplasm of breast: Secondary | ICD-10-CM | POA: Diagnosis not present

## 2016-11-26 ENCOUNTER — Emergency Department
Admission: EM | Admit: 2016-11-26 | Discharge: 2016-11-26 | Disposition: A | Discharge status: Home / Self Care | Payer: Medicare Other | Attending: Emergency Medicine | Admitting: Emergency Medicine

## 2016-11-26 ENCOUNTER — Emergency Department: Payer: Medicare Other

## 2016-11-26 ENCOUNTER — Encounter: Payer: Self-pay | Admitting: Emergency Medicine

## 2016-11-26 DIAGNOSIS — E119 Type 2 diabetes mellitus without complications: Secondary | ICD-10-CM | POA: Insufficient documentation

## 2016-11-26 DIAGNOSIS — Z79899 Other long term (current) drug therapy: Secondary | ICD-10-CM | POA: Diagnosis not present

## 2016-11-26 DIAGNOSIS — I10 Essential (primary) hypertension: Secondary | ICD-10-CM | POA: Diagnosis not present

## 2016-11-26 DIAGNOSIS — R05 Cough: Secondary | ICD-10-CM | POA: Diagnosis present

## 2016-11-26 DIAGNOSIS — J40 Bronchitis, not specified as acute or chronic: Secondary | ICD-10-CM | POA: Diagnosis not present

## 2016-11-26 MED ORDER — PREDNISONE 20 MG PO TABS
60.0000 mg | ORAL_TABLET | Freq: Once | ORAL | Status: AC
  Administered 2016-11-26: 60 mg via ORAL
  Filled 2016-11-26: qty 3

## 2016-11-26 MED ORDER — PSEUDOEPH-BROMPHEN-DM 30-2-10 MG/5ML PO SYRP: 10.0000 mL | ORAL_SOLUTION | Freq: Four times a day (QID) | ORAL | 0 refills | Status: DC | PRN

## 2016-11-26 MED ORDER — PREDNISONE 10 MG PO TABS: 10.0000 mg | ORAL_TABLET | Freq: Every day | ORAL | 0 refills | Status: DC

## 2016-11-26 NOTE — ED Triage Notes (Signed)
Pt states "ive been sick for awhile" - cold symptoms. Nad. vss

## 2016-11-26 NOTE — ED Notes (Signed)
Pt says she finished a z-pack a few days ago that was prescribed by a walk-in clinic for respiratory infection; pt says still taking the cough medicine but she was up all night coughing; sometimes she coughs until she vomits; pt denies fever; pt says she has a productive cough of yellow phlegm;

## 2016-11-26 NOTE — ED Triage Notes (Signed)
Pt states uri symptoms with post tussive emesis for "weeks". Pt states "i'm coughing up phlegm". Pt appears in no acute distress. Pt states "i can't sleep I cough". Pt complains of generalized body aches. Pt is developmentally delayed. Skin normal color warm and dry.

## 2016-11-27 NOTE — ED Provider Notes (Signed)
[facility] Emergency Department Provider Note  ____________________________________________  Time seen: Approximately 12:18 AM  I have reviewed the triage vital signs and the nursing notes.   HISTORY  Chief Complaint Cough    HPI Hannah Clarke is a 50 y.o. female who presents emergency department complaining of chronic cough 1 month. Per the patient, she developed a cough that persisted for several weeks. She was seen by urgent care and diagnosed with community-acquired pneumonia. Patient states that she finished antibiotics 3 days prior. She complains of continual coughing. She denies any headache, visual changes, nasal congestion, sore throat, difficulty breathing, chest pain, abdominal pain, nausea or vomiting. Patient reports that she has had an episode of posttussive emesis yesterday. She is not taking any other medications for this condition.  Patient does have a history of mental developmental delay, diabetes, high blood pressure, panic attacks. Patient denies any other medical complaints at this time. Patient does appear competent to make medical decisions at this time.   Past Medical History:  Diagnosis Date  . Anxiety   . Chronic headaches   . Diabetes mellitus without complication ([place])   . GERD (gastroesophageal reflux disease)   . High cholesterol   . Hypertension   . Mental developmental delay   . Panic attacks     There are no active problems to display for this patient.   Past Surgical History:  Procedure Laterality Date  . HAND SURGERY    . KIDNEY STONE SURGERY      Prior to Admission medications   Medication Sig Start Date End Date Taking? Authorizing Provider  albuterol (PROVENTIL HFA;VENTOLIN HFA) 108 (90 BASE) MCG/ACT inhaler Inhale 2 puffs into the lungs every 6 (six) hours as needed for wheezing or shortness of breath. 11/07/15   Orbie Pyo, MD  brompheniramine-pseudoephedrine-DM 30-2-10 MG/5ML syrup Take 10 mLs  by mouth 4 (four) times daily as needed. 11/26/16   Charline Bills Komal Stangelo, PA-C  loperamide (IMODIUM A-D) 2 MG tablet Take 1 tablet (2 mg total) by mouth 4 (four) times daily as needed for diarrhea or loose stools. 06/12/16   Anne-Caroline Mariea Clonts, MD  LORazepam (ATIVAN) 1 MG tablet Take 1 tablet (1 mg total) by mouth every 8 (eight) hours as needed for anxiety. 02/03/16   Paulette Blanch, MD  predniSONE (DELTASONE) 10 MG tablet Take 1 tablet (10 mg total) by mouth daily. 11/26/16   Charline Bills Danyale Ridinger, PA-C  traMADol (ULTRAM) 50 MG tablet Take 1 tablet (50 mg total) by mouth every 6 (six) hours as needed. 04/18/16 04/18/17  Harvest Dark, MD    Allergies Patient has no known allergies.  Family History  Problem Relation Age of Onset  . Breast cancer Mother 44    pre pt.     Social History Social History  Substance Use Topics  . Smoking status: Never Smoker  . Smokeless tobacco: Never Used  . Alcohol use No     Review of Systems  Constitutional: No fever/chills Eyes: No visual changes. No discharge ENT: No upper respiratory complaints. Cardiovascular: no chest pain. Respiratory: Positive cough. No SOB. Gastrointestinal: No abdominal pain.  One episode of posttussive emesis..  No diarrhea.  No constipation. Musculoskeletal: Negative for musculoskeletal pain. Skin: Negative for rash, abrasions, lacerations, ecchymosis. Neurological: Negative for headaches, focal weakness or numbness. 10-point ROS otherwise negative.  ____________________________________________   PHYSICAL EXAM:  VITAL SIGNS: ED Triage Vitals  Enc Vitals Group     BP 11/26/16 1921 126/84  Pulse Rate 11/26/16 1921 77     Resp 11/26/16 1921 16     Temp 11/26/16 1921 98.8 F (37.1 C)     Temp Source 11/26/16 1921 Oral     SpO2 11/26/16 1921 97 %     Weight 11/26/16 1921 179 lb (81.2 kg)     Height 11/26/16 1921 5\' 2"  (1.575 m)     Head Circumference --      Peak Flow --      Pain Score 11/26/16 1926 6      Pain Loc --      Pain Edu? --      Excl. in [place]? --      Constitutional: Alert and oriented. Well appearing and in no acute distress. Eyes: Conjunctivae are normal. PERRL. EOMI. Head: Atraumatic. ENT:      Ears: EACs and TMs are unremarkable bilaterally      Nose: No congestion/rhinnorhea.      Mouth/Throat: Mucous membranes are moist.  Neck: No stridor.   Hematological/Lymphatic/Immunilogical: No cervical lymphadenopathy. Cardiovascular: Normal rate, regular rhythm. Normal S1 and S2.  Good peripheral circulation. Respiratory: Normal respiratory effort without tachypnea or retractions. Lungs with a few scattered coarse breath sounds. No definitive wheezing. No rales or rhonchi.Kermit Balo air entry to the bases with no decreased or absent breath sounds. Musculoskeletal: Full range of motion to all extremities. No gross deformities appreciated. Neurologic:  Normal speech and language. No gross focal neurologic deficits are appreciated.  Skin:  Skin is warm, dry and intact. No rash noted. Psychiatric: Mood and affect are normal. Speech and behavior are normal. Patient exhibits appropriate insight and judgement.   ____________________________________________   LABS (all labs ordered are listed, but only abnormal results are displayed)  Labs Reviewed - No data to display ____________________________________________  EKG   ____________________________________________  RADIOLOGY Diamantina Providence Apoorva Bugay, personally viewed and evaluated these images (plain radiographs) as part of my medical decision making, as well as reviewing the written report by the radiologist.  Dg Chest 2 View  Result Date: 11/26/2016 CLINICAL DATA:  Productive cough.  Emesis. EXAM: CHEST  2 VIEW COMPARISON:  03/30/2016 and priors. FINDINGS: The heart size and mediastinal contours are within normal limits. Both lungs are clear. The visualized skeletal structures are unremarkable. IMPRESSION: No active  cardiopulmonary disease. Electronically Signed   By: Fidela Salisbury M.D.   On: 11/26/2016 20:49    ____________________________________________    PROCEDURES  Procedure(s) performed:    Procedures    Medications  predniSONE (DELTASONE) tablet 60 mg (60 mg Oral Given 11/26/16 2252)     ____________________________________________   INITIAL IMPRESSION / ASSESSMENT AND PLAN / ED COURSE  Pertinent labs & imaging results that were available during my care of the patient were reviewed by me and considered in my medical decision making (see chart for details).  Review of the [place] CSRS was performed in accordance of the [place] prior to dispensing any controlled drugs.  Clinical Course     Patient's diagnosis is consistent with Bronchitis. Patient has been treated for community-acquired pneumonia and chest x-ray reveals no acute cardiopulmonary abnormality. Due to the length of symptoms, patient likely has residual inflammation. This will be treated with cough medication and steroids. Patient does have an albuterol inhaler at home and is instructed to use this in addition to the prescribed medications. Patient will follow-up with primary care as needed. Patient is given ED precautions to return to the ED for any worsening or new symptoms.  ____________________________________________  FINAL CLINICAL IMPRESSION(S) / ED DIAGNOSES  Final diagnoses:  Bronchitis      NEW MEDICATIONS STARTED DURING THIS VISIT:  Discharge Medication List as of 11/26/2016 10:43 PM    START taking these medications   Details  brompheniramine-pseudoephedrine-DM 30-2-10 MG/5ML syrup Take 10 mLs by mouth 4 (four) times daily as needed., Starting Sun 11/26/2016, Print    predniSONE (DELTASONE) 10 MG tablet Take 1 tablet (10 mg total) by mouth daily., Starting Sun 11/26/2016, Print            This chart was dictated using voice recognition software/Dragon. Despite best efforts to  proofread, errors can occur which can change the meaning. Any change was purely unintentional.    Darletta Moll, PA-C 11/27/16 [id number]    Nance Pear, MD 11/27/16 1210

## 2017-01-23 ENCOUNTER — Emergency Department: Payer: Medicare Other

## 2017-01-23 ENCOUNTER — Encounter: Payer: Self-pay | Admitting: Emergency Medicine

## 2017-01-23 ENCOUNTER — Emergency Department
Admission: EM | Admit: 2017-01-23 | Discharge: 2017-01-23 | Disposition: A | Discharge status: Home / Self Care | Payer: Medicare Other | Attending: Emergency Medicine | Admitting: Emergency Medicine

## 2017-01-23 DIAGNOSIS — Y929 Unspecified place or not applicable: Secondary | ICD-10-CM | POA: Insufficient documentation

## 2017-01-23 DIAGNOSIS — Z79899 Other long term (current) drug therapy: Secondary | ICD-10-CM | POA: Insufficient documentation

## 2017-01-23 DIAGNOSIS — I10 Essential (primary) hypertension: Secondary | ICD-10-CM | POA: Insufficient documentation

## 2017-01-23 DIAGNOSIS — S80211A Abrasion, right knee, initial encounter: Secondary | ICD-10-CM

## 2017-01-23 DIAGNOSIS — E119 Type 2 diabetes mellitus without complications: Secondary | ICD-10-CM | POA: Insufficient documentation

## 2017-01-23 DIAGNOSIS — Y9367 Activity, basketball: Secondary | ICD-10-CM | POA: Diagnosis not present

## 2017-01-23 DIAGNOSIS — W19XXXA Unspecified fall, initial encounter: Secondary | ICD-10-CM | POA: Insufficient documentation

## 2017-01-23 DIAGNOSIS — M25561 Pain in right knee: Secondary | ICD-10-CM

## 2017-01-23 DIAGNOSIS — Y998 Other external cause status: Secondary | ICD-10-CM | POA: Diagnosis not present

## 2017-01-23 DIAGNOSIS — Z7984 Long term (current) use of oral hypoglycemic drugs: Secondary | ICD-10-CM | POA: Insufficient documentation

## 2017-01-23 DIAGNOSIS — S8991XA Unspecified injury of right lower leg, initial encounter: Secondary | ICD-10-CM | POA: Diagnosis present

## 2017-01-23 LAB — POCT PREGNANCY, URINE: Preg Test, Ur: NEGATIVE

## 2017-01-23 MED ORDER — BACITRACIN ZINC 500 UNIT/GM EX OINT
1.0000 "application " | TOPICAL_OINTMENT | Freq: Two times a day (BID) | CUTANEOUS | Status: DC
  Administered 2017-01-23: 1 via TOPICAL
  Filled 2017-01-23: qty 0.9

## 2017-01-23 MED ORDER — IBUPROFEN 600 MG PO TABS: 600.0000 mg | ORAL_TABLET | Freq: Four times a day (QID) | ORAL | 0 refills | Status: DC | PRN

## 2017-01-23 NOTE — ED Triage Notes (Signed)
Pt ambulatory to triage with steady gait, no distress noted. Pt c/o right knee pain, post fall while playing basketball today. On assessment pts right knee is red, no swelling or deformity noted.

## 2017-01-23 NOTE — ED Provider Notes (Signed)
[facility] Emergency Department Provider Note ____________________________________________  Time seen: Approximately 9:38 PM  I have reviewed the triage vital signs and the nursing notes.   HISTORY  Chief Complaint Fall    HPI Hannah Clarke is a 51 y.o. female who fell while playing basketball today injuring her right knee. She reports that the knee is painful only with weight bearing. She took tylenol without relief.   Past Medical History:  Diagnosis Date  . Anxiety   . Chronic headaches   . Diabetes mellitus without complication ([place])   . GERD (gastroesophageal reflux disease)   . High cholesterol   . Hypertension   . Mental developmental delay   . Panic attacks     There are no active problems to display for this patient.   Past Surgical History:  Procedure Laterality Date  . HAND SURGERY    . KIDNEY STONE SURGERY      Prior to Admission medications   Medication Sig Start Date End Date Taking? Authorizing Provider  albuterol (PROVENTIL HFA;VENTOLIN HFA) 108 (90 BASE) MCG/ACT inhaler Inhale 2 puffs into the lungs every 6 (six) hours as needed for wheezing or shortness of breath. 11/07/15   Orbie Pyo, MD  brompheniramine-pseudoephedrine-DM 30-2-10 MG/5ML syrup Take 10 mLs by mouth 4 (four) times daily as needed. 11/26/16   Charline Bills Cuthriell, PA-C  ibuprofen (ADVIL,MOTRIN) 600 MG tablet Take 1 tablet (600 mg total) by mouth every 6 (six) hours as needed. 01/23/17   Victorino Dike, FNP  loperamide (IMODIUM A-D) 2 MG tablet Take 1 tablet (2 mg total) by mouth 4 (four) times daily as needed for diarrhea or loose stools. 06/12/16   Anne-Caroline Mariea Clonts, MD  LORazepam (ATIVAN) 1 MG tablet Take 1 tablet (1 mg total) by mouth every 8 (eight) hours as needed for anxiety. 02/03/16   Paulette Blanch, MD  predniSONE (DELTASONE) 10 MG tablet Take 1 tablet (10 mg total) by mouth daily. 11/26/16   Charline Bills Cuthriell, PA-C  traMADol (ULTRAM) 50 MG  tablet Take 1 tablet (50 mg total) by mouth every 6 (six) hours as needed. 04/18/16 04/18/17  Harvest Dark, MD    Allergies Patient has no known allergies.  Family History  Problem Relation Age of Onset  . Breast cancer Mother 59    pre pt.     Social History Social History  Substance Use Topics  . Smoking status: Never Smoker  . Smokeless tobacco: Never Used  . Alcohol use No    Review of Systems Constitutional: No recent illness. Cardiovascular: Denies chest pain or palpitations. Respiratory: Denies shortness of breath. Musculoskeletal: Pain in right knee Skin: Positive for abrasion to right knee. Neurological: Negative for focal weakness or numbness.  ____________________________________________   PHYSICAL EXAM:  VITAL SIGNS: ED Triage Vitals  Enc Vitals Group     BP 01/23/17 2046 135/83     Pulse Rate 01/23/17 2046 74     Resp 01/23/17 2046 16     Temp 01/23/17 2046 98 F (36.7 C)     Temp Source 01/23/17 2046 Oral     SpO2 01/23/17 2046 99 %     Weight 01/23/17 2046 179 lb (81.2 kg)     Height 01/23/17 2046 5\' 2"  (1.575 m)     Head Circumference --      Peak Flow --      Pain Score 01/23/17 2109 9     Pain Loc --      Pain  Edu? --      Excl. in [place]? --     Constitutional: Alert and oriented. Well appearing and in no acute distress. Eyes: Conjunctivae are normal. EOMI. Head: Atraumatic. Neck: No stridor.  Respiratory: Normal respiratory effort.   Musculoskeletal: Right knee is stable on exam. No obvious deformity. Patella mildly tender to palpation. Full ROM on exam.  Neurologic:  Normal speech and language. No gross focal neurologic deficits are appreciated. Speech is normal. No gait instability. Skin:  Small abrasion noted to the distal patellar surface without foreign body. Psychiatric: Mood and affect are normal. Speech and behavior are normal.  ____________________________________________   LABS (all labs ordered are listed, but only  abnormal results are displayed)  Labs Reviewed  POC URINE PREG, ED  POCT PREGNANCY, URINE   ____________________________________________  RADIOLOGY  Right knee negative for acute bony abnormality per radiology. ____________________________________________   PROCEDURES  Procedure(s) performed: ACE bandage applied by ER tech after wound was cleansed and bacitracin applied.   ____________________________________________   INITIAL IMPRESSION / ASSESSMENT AND PLAN / ED COURSE     Pertinent labs & imaging results that were available during my care of the patient were reviewed by me and considered in my medical decision making (see chart for details).  51 year old female presenting to the emergency department for evaluation of right knee pain. X-rays are negative for acute bony abnormalities which is consistent with her physical exam. Ace bandage applied and patient was advised to rest, ice, and elevate her right lower extremities for a couple of days. She was advised to follow up with her PCP for symptoms that are not improving over the week or return to the ER for symptoms that change or worsen.  ____________________________________________   FINAL CLINICAL IMPRESSION(S) / ED DIAGNOSES  Final diagnoses:  Acute pain of right knee  Abrasion of knee, right, initial encounter       Victorino Dike, FNP 01/23/17 2219    Darel Hong, MD 01/24/17 1517

## 2017-01-23 NOTE — Discharge Instructions (Signed)
See your doctor if your knee does not feel better in a week. Come back to the emergency room if it gets worse and you can't schedule an appointment with your doctor.

## 2017-01-23 NOTE — ED Notes (Signed)
Pt reports that she fell and hit right knee - pt denies any other pain

## 2017-02-11 ENCOUNTER — Emergency Department: Payer: Medicare Other

## 2017-02-11 ENCOUNTER — Encounter: Payer: Self-pay | Admitting: Emergency Medicine

## 2017-02-11 ENCOUNTER — Emergency Department
Admission: EM | Admit: 2017-02-11 | Discharge: 2017-02-11 | Disposition: A | Discharge status: Home / Self Care | Payer: Medicare Other | Attending: Student in an Organized Health Care Education/Training Program | Admitting: Student in an Organized Health Care Education/Training Program

## 2017-02-11 DIAGNOSIS — E119 Type 2 diabetes mellitus without complications: Secondary | ICD-10-CM | POA: Insufficient documentation

## 2017-02-11 DIAGNOSIS — W0110XA Fall on same level from slipping, tripping and stumbling with subsequent striking against unspecified object, initial encounter: Secondary | ICD-10-CM | POA: Diagnosis not present

## 2017-02-11 DIAGNOSIS — Y929 Unspecified place or not applicable: Secondary | ICD-10-CM | POA: Insufficient documentation

## 2017-02-11 DIAGNOSIS — Z79899 Other long term (current) drug therapy: Secondary | ICD-10-CM | POA: Diagnosis not present

## 2017-02-11 DIAGNOSIS — R110 Nausea: Secondary | ICD-10-CM | POA: Insufficient documentation

## 2017-02-11 DIAGNOSIS — S8001XA Contusion of right knee, initial encounter: Secondary | ICD-10-CM | POA: Insufficient documentation

## 2017-02-11 DIAGNOSIS — Y9367 Activity, basketball: Secondary | ICD-10-CM | POA: Insufficient documentation

## 2017-02-11 DIAGNOSIS — Z7984 Long term (current) use of oral hypoglycemic drugs: Secondary | ICD-10-CM | POA: Insufficient documentation

## 2017-02-11 DIAGNOSIS — Y998 Other external cause status: Secondary | ICD-10-CM | POA: Diagnosis not present

## 2017-02-11 DIAGNOSIS — I10 Essential (primary) hypertension: Secondary | ICD-10-CM | POA: Diagnosis not present

## 2017-02-11 DIAGNOSIS — Y92009 Unspecified place in unspecified non-institutional (private) residence as the place of occurrence of the external cause: Secondary | ICD-10-CM

## 2017-02-11 DIAGNOSIS — S8991XA Unspecified injury of right lower leg, initial encounter: Secondary | ICD-10-CM | POA: Diagnosis present

## 2017-02-11 DIAGNOSIS — S0990XA Unspecified injury of head, initial encounter: Secondary | ICD-10-CM | POA: Diagnosis not present

## 2017-02-11 DIAGNOSIS — W19XXXA Unspecified fall, initial encounter: Secondary | ICD-10-CM

## 2017-02-11 LAB — CBC WITH DIFFERENTIAL/PLATELET
BASOS ABS: 0 10*3/uL (ref 0–0.1)
BASOS PCT: 1 %
Eosinophils Absolute: 0.2 10*3/uL (ref 0–0.7)
Eosinophils Relative: 4 %
HCT: 34.2 % — ABNORMAL LOW (ref 35.0–47.0)
HEMOGLOBIN: 12.1 g/dL (ref 12.0–16.0)
LYMPHS PCT: 29 %
Lymphs Abs: 1.8 10*3/uL (ref 1.0–3.6)
MCH: 31.1 pg (ref 26.0–34.0)
MCHC: 35.4 g/dL (ref 32.0–36.0)
MCV: 87.8 fL (ref 80.0–100.0)
Monocytes Absolute: 0.5 10*3/uL (ref 0.2–0.9)
Monocytes Relative: 8 %
NEUTROS ABS: 3.6 10*3/uL (ref 1.4–6.5)
NEUTROS PCT: 58 %
Platelets: 270 10*3/uL (ref 150–440)
RBC: 3.9 MIL/uL (ref 3.80–5.20)
RDW: 13.9 % (ref 11.5–14.5)
WBC: 6.1 10*3/uL (ref 3.6–11.0)

## 2017-02-11 LAB — COMPREHENSIVE METABOLIC PANEL
ALBUMIN: 4.3 g/dL (ref 3.5–5.0)
ALK PHOS: 55 U/L (ref 38–126)
ALT: 16 U/L (ref 14–54)
AST: 25 U/L (ref 15–41)
Anion gap: 8 (ref 5–15)
BILIRUBIN TOTAL: 0.3 mg/dL (ref 0.3–1.2)
BUN: 14 mg/dL (ref 6–20)
CO2: 22 mmol/L (ref 22–32)
CREATININE: 0.52 mg/dL (ref 0.44–1.00)
Calcium: 9.1 mg/dL (ref 8.9–10.3)
Chloride: 109 mmol/L (ref 101–111)
GFR calc Af Amer: >60 mL/min (ref >60)
GFR calc non Af Amer: >60 mL/min (ref >60)
GLUCOSE: 133 mg/dL — AB (ref 65–99)
POTASSIUM: 4 mmol/L (ref 3.5–5.1)
Sodium: 139 mmol/L (ref 135–145)
TOTAL PROTEIN: 7 g/dL (ref 6.5–8.1)

## 2017-02-11 LAB — LIPASE, BLOOD: Lipase: 11 U/L (ref 11–51)

## 2017-02-11 MED ORDER — ACETAMINOPHEN 500 MG PO TABS
1000.0000 mg | ORAL_TABLET | Freq: Once | ORAL | Status: AC
  Administered 2017-02-11: 1000 mg via ORAL
  Filled 2017-02-11: qty 2

## 2017-02-11 NOTE — ED Triage Notes (Signed)
Pt states falling 8 days ago, bruising to right knee/lower leg. States hit forehead and persistent HA since. No trauma present to head at this time. Pt states abdominal pain x2-3 days. Nausea present. Denies V/D.   PTA OTC ibuprofen and tylenol, denies relief.   Pt poor historian at this time. States "just not feeling good"

## 2017-02-11 NOTE — ED Notes (Signed)
Pt. Verbalizes understanding of d/c instructions and follow-up. VS stable and pain controlled per pt.  Pt. In NAD at time of d/c and denies further concerns regarding this visit. Pt. Stable at the time of departure from the unit, departing unit by the safest and most appropriate manner per that pt condition and limitations. Pt advised to return to the ED at any time for emergent concerns, or for new/worsening symptoms.   

## 2017-02-11 NOTE — ED Provider Notes (Signed)
[facility] Emergency Department Provider Note    First MD Initiated Contact with Patient 02/11/17 1944     (approximate)  I have reviewed the triage vital signs and the nursing notes.   HISTORY  Chief Complaint Headache and Fall  Level V Caveat:  Cognitive delay  HPI Hannah Clarke is a 51 y.o. female with a history of intellectual disability presents with headache that has been persistent for the past week after she had a mechanical fall on the right knee. Patient was seen at the end of last month for right knee pain after mechanical fall while playing basketball. States that after being evaluated she then was [place] and tripped on a curb. States that she fell and hit her forehead. Was not evaluated after this. States that she had a mild headache since then. Denies any vomiting or diarrhea. States that she intermittently has abdominal pain over the past 2-3 days with nausea. States the pain is related to the nausea. Denies any numbness or tingling. Is not on any blood thinners.   Past Medical History:  Diagnosis Date  . Anxiety   . Chronic headaches   . Diabetes mellitus without complication ([place])   . GERD (gastroesophageal reflux disease)   . High cholesterol   . Hypertension   . Mental developmental delay   . Panic attacks    Family History  Problem Relation Age of Onset  . Breast cancer Mother 5    pre pt.    Past Surgical History:  Procedure Laterality Date  . HAND SURGERY    . KIDNEY STONE SURGERY     There are no active problems to display for this patient.     Prior to Admission medications   Medication Sig Start Date End Date Taking? Authorizing Provider  albuterol (PROVENTIL HFA;VENTOLIN HFA) 108 (90 BASE) MCG/ACT inhaler Inhale 2 puffs into the lungs every 6 (six) hours as needed for wheezing or shortness of breath. 11/07/15   Orbie Pyo, MD  brompheniramine-pseudoephedrine-DM 30-2-10 MG/5ML syrup Take 10  mLs by mouth 4 (four) times daily as needed. 11/26/16   Charline Bills Cuthriell, PA-C  ibuprofen (ADVIL,MOTRIN) 600 MG tablet Take 1 tablet (600 mg total) by mouth every 6 (six) hours as needed. 01/23/17   Victorino Dike, FNP  loperamide (IMODIUM A-D) 2 MG tablet Take 1 tablet (2 mg total) by mouth 4 (four) times daily as needed for diarrhea or loose stools. 06/12/16   Anne-Caroline Mariea Clonts, MD  LORazepam (ATIVAN) 1 MG tablet Take 1 tablet (1 mg total) by mouth every 8 (eight) hours as needed for anxiety. 02/03/16   Paulette Blanch, MD  predniSONE (DELTASONE) 10 MG tablet Take 1 tablet (10 mg total) by mouth daily. 11/26/16   Charline Bills Cuthriell, PA-C  traMADol (ULTRAM) 50 MG tablet Take 1 tablet (50 mg total) by mouth every 6 (six) hours as needed. 04/18/16 04/18/17  Harvest Dark, MD    Allergies Patient has no known allergies.    Social History Social History  Substance Use Topics  . Smoking status: Never Smoker  . Smokeless tobacco: Never Used  . Alcohol use No    Review of Systems Patient denies headaches, rhinorrhea, blurry vision, numbness, shortness of breath, chest pain, edema, cough, abdominal pain, nausea, vomiting, diarrhea, dysuria, fevers, rashes or hallucinations unless otherwise stated above in HPI. ____________________________________________   PHYSICAL EXAM:  VITAL SIGNS: Vitals:   02/11/17 1939  BP: 132/86  Pulse: 72  Resp: 18  Temp: 99 F (37.2 C)    Constitutional: Alert , in no acute distress. Eyes: Conjunctivae are normal. PERRL. Head: Atraumatic. Nose: No congestion/rhinnorhea. Mouth/Throat: Mucous membranes are moist.  Oropharynx non-erythematous. Neck: No stridor. Painless ROM. No cervical spine tenderness to palpation Hematological/Lymphatic/Immunilogical: No cervical lymphadenopathy. Cardiovascular: Normal rate, regular rhythm. Grossly normal heart sounds.  Good peripheral circulation. Respiratory: Normal respiratory effort.  No retractions. Lungs  CTAB. Gastrointestinal: Soft and nontender. No distention. No abdominal bruits. No CVA tenderness. Musculoskeletal: ttp of right knee with soft tissue ecchymosis, no effusion, no joint laxity on exam. no edema.  No joint effusions. Neurologic:  Mild cognitive delay but able to follow commands and complete neuro exam without difficulty, MAE spontaneoulsy, no facial droop,  Normal FNF,  SILT thoughout.  Steady gait with the use of cane Skin:  Skin is warm, dry and intact. No rash noted. Psychiatric: Mood and affect are normal. Speech and behavior are normal.  ____________________________________________   LABS (all labs ordered are listed, but only abnormal results are displayed)  Results for orders placed or performed during the hospital encounter of 02/11/17 (from the past 24 hour(s))  CBC with Differential/Platelet     Status: Abnormal   Collection Time: 02/11/17  8:29 PM  Result Value Ref Range   WBC 6.1 3.6 - 11.0 K/uL   RBC 3.90 3.80 - 5.20 MIL/uL   Hemoglobin 12.1 12.0 - 16.0 g/dL   HCT 34.2 (L) 35.0 - 47.0 %   MCV 87.8 80.0 - 100.0 fL   MCH 31.1 26.0 - 34.0 pg   MCHC 35.4 32.0 - 36.0 g/dL   RDW 13.9 11.5 - 14.5 %   Platelets 270 150 - 440 K/uL   Neutrophils Relative % 58 %   Neutro Abs 3.6 1.4 - 6.5 K/uL   Lymphocytes Relative 29 %   Lymphs Abs 1.8 1.0 - 3.6 K/uL   Monocytes Relative 8 %   Monocytes Absolute 0.5 0.2 - 0.9 K/uL   Eosinophils Relative 4 %   Eosinophils Absolute 0.2 0 - 0.7 K/uL   Basophils Relative 1 %   Basophils Absolute 0.0 0 - 0.1 K/uL  Comprehensive metabolic panel     Status: Abnormal   Collection Time: 02/11/17  8:29 PM  Result Value Ref Range   Sodium 139 135 - 145 mmol/L   Potassium 4.0 3.5 - 5.1 mmol/L   Chloride 109 101 - 111 mmol/L   CO2 22 22 - 32 mmol/L   Glucose, Bld 133 (H) 65 - 99 mg/dL   BUN 14 6 - 20 mg/dL   Creatinine, Ser 0.52 0.44 - 1.00 mg/dL   Calcium 9.1 8.9 - 10.3 mg/dL   Total Protein 7.0 6.5 - 8.1 g/dL   Albumin 4.3 3.5 -  5.0 g/dL   AST 25 15 - 41 U/L   ALT 16 14 - 54 U/L   Alkaline Phosphatase 55 38 - 126 U/L   Total Bilirubin 0.3 0.3 - 1.2 mg/dL   GFR calc non Af Amer >60 >60 mL/min   GFR calc Af Amer >60 >60 mL/min   Anion gap 8 5 - 15  Lipase, blood     Status: None   Collection Time: 02/11/17  8:29 PM  Result Value Ref Range   Lipase 11 11 - 51 U/L   ____________________________________________ ____________________________________________  RADIOLOGY  I personally reviewed all radiographic images ordered to evaluate for the above acute complaints and reviewed radiology reports and findings.  These findings  were personally discussed with the patient.  Please see medical record for radiology report.  ____________________________________________   PROCEDURES  Procedure(s) performed:  Procedures    Critical Care performed: no ____________________________________________   INITIAL IMPRESSION / ASSESSMENT AND PLAN / ED COURSE  Pertinent labs & imaging results that were available during my care of the patient were reviewed by me and considered in my medical decision making (see chart for details).  DDX: sdh, iph, concussion, enteritis, dehydration  ASNA MULDROW is a 51 y.o. who presents to the ED with "Dr. Randel Books fall. CT imaging ordered of head to evaluate for any evidence of subdural hematoma or intracranial abnormality. CT head shows no acute intracranial abnormality. No focal neuro deficits at this time. Patient without any nuchal rigidity or meningismus. Abdominal exam is soft and benign. Blood work sent to evaluate for her complaint for nausea and vomiting. Her blood work is otherwise reassuring. Do not feel that CT imaging clinically indicated of the belly at this time. Patient able to tolerate oral hydration and is in no acute distress. We'll provide referral for orthopedics.  Patient was able to tolerate PO and was able to ambulate with a steady gait.  Have discussed with the  patient and available family all diagnostics and treatments performed thus far and all questions were answered to the best of my ability. The patient demonstrates understanding and agreement with plan.       ____________________________________________   FINAL CLINICAL IMPRESSION(S) / ED DIAGNOSES  Final diagnoses:  Fall in home, initial encounter  Injury of head, initial encounter  Nausea      NEW MEDICATIONS STARTED DURING THIS VISIT:  New Prescriptions   No medications on file     Note:  This document was prepared using Dragon voice recognition software and may include unintentional dictation errors.    [name], MD 02/11/17 2139

## 2017-07-27 ENCOUNTER — Encounter: Payer: Self-pay | Admitting: Emergency Medicine

## 2017-07-27 ENCOUNTER — Emergency Department: Payer: Medicare Other

## 2017-07-27 DIAGNOSIS — E119 Type 2 diabetes mellitus without complications: Secondary | ICD-10-CM | POA: Insufficient documentation

## 2017-07-27 DIAGNOSIS — R079 Chest pain, unspecified: Secondary | ICD-10-CM | POA: Diagnosis present

## 2017-07-27 DIAGNOSIS — Z79899 Other long term (current) drug therapy: Secondary | ICD-10-CM | POA: Insufficient documentation

## 2017-07-27 DIAGNOSIS — I10 Essential (primary) hypertension: Secondary | ICD-10-CM | POA: Diagnosis not present

## 2017-07-27 LAB — CBC
HEMATOCRIT: 37.4 % (ref 35.0–47.0)
Hemoglobin: 12.7 g/dL (ref 12.0–16.0)
MCH: 29.8 pg (ref 26.0–34.0)
MCHC: 34.1 g/dL (ref 32.0–36.0)
MCV: 87.6 fL (ref 80.0–100.0)
Platelets: 300 10*3/uL (ref 150–440)
RBC: 4.27 MIL/uL (ref 3.80–5.20)
RDW: 13.8 % (ref 11.5–14.5)
WBC: 6.4 10*3/uL (ref 3.6–11.0)

## 2017-07-27 LAB — BASIC METABOLIC PANEL
Anion gap: 10 (ref 5–15)
BUN: 11 mg/dL (ref 6–20)
CHLORIDE: 106 mmol/L (ref 101–111)
CO2: 21 mmol/L — AB (ref 22–32)
Calcium: 9.3 mg/dL (ref 8.9–10.3)
Creatinine, Ser: 0.46 mg/dL (ref 0.44–1.00)
GFR calc Af Amer: >60 mL/min (ref >60)
GFR calc non Af Amer: >60 mL/min (ref >60)
GLUCOSE: 113 mg/dL — AB (ref 65–99)
POTASSIUM: 3.6 mmol/L (ref 3.5–5.1)
Sodium: 137 mmol/L (ref 135–145)

## 2017-07-27 LAB — TROPONIN I: Troponin I: <0.03 ng/mL (ref <0.03)

## 2017-07-27 NOTE — ED Triage Notes (Signed)
Pt arrives via taxi to ED from [facility] with c/o chest pain. Pt states that she had pain in the central chest starting last night and continuing throughout today. Pt denies other cardiac symptoms. Pt is in noticeable discomfort at this time in triage.

## 2017-07-27 NOTE — ED Notes (Signed)
[place] coordinator [place]. Per Janett Billow, pt had no legal guardian and staff member is on their way to sit with pt. Pt informed of this.

## 2017-07-28 ENCOUNTER — Emergency Department
Admission: EM | Admit: 2017-07-28 | Discharge: 2017-07-28 | Disposition: A | Discharge status: Home / Self Care | Payer: Medicare Other | Attending: Emergency Medicine | Admitting: Emergency Medicine

## 2017-07-28 DIAGNOSIS — R079 Chest pain, unspecified: Secondary | ICD-10-CM | POA: Diagnosis not present

## 2017-07-28 LAB — TROPONIN I: Troponin I: <0.03 ng/mL (ref <0.03)

## 2017-07-28 MED ORDER — SUCRALFATE 1 G PO TABS: 1.0000 g | ORAL_TABLET | Freq: Two times a day (BID) | ORAL | 0 refills | Status: DC

## 2017-07-28 MED ORDER — KETOROLAC TROMETHAMINE 60 MG/2ML IM SOLN
60.0000 mg | Freq: Once | INTRAMUSCULAR | Status: AC
  Administered 2017-07-28: 60 mg via INTRAMUSCULAR
  Filled 2017-07-28: qty 2

## 2017-07-28 MED ORDER — GI COCKTAIL ~~LOC~~
30.0000 mL | Freq: Once | ORAL | Status: AC
  Administered 2017-07-28: 30 mL via ORAL
  Filled 2017-07-28: qty 30

## 2017-07-28 NOTE — ED Notes (Signed)
Signature pad not working, pt verbalizes understanding of discharge, prescription, and follow up instructions.

## 2017-07-28 NOTE — ED Provider Notes (Signed)
[facility] Emergency Department Provider Note   ____________________________________________   First MD Initiated Contact with Patient 07/28/17 [phone]     (approximate)  I have reviewed the triage vital signs and the nursing notes.   HISTORY  Chief Complaint Chest Pain    HPI Hannah Clarke is a 51 y.o. female who comes into the hospital today with some chest pain. The patient states that her chest is bothering her since yesterday. She has pain in her mid chest. She states that she can't breathe too well. She states that someone was smoking outside by the bus stop which brought on the symptoms. She reports that she tries to move away from the smoker but whenever she is around cigarette smoke it irritates her. The patient reports that she didn't feel well so she didn't eat much dinner. She had a Rice cake. She states that she's had some nausea with hot and cold flashes. Her left hand feels stiff. She rates her pain a 10 out of 10 in intensity currently. She states that the pain was bothering her so much that she was crying. She did not take anything for pain today. She is here today for evaluation.   Past Medical History:  Diagnosis Date  . Anxiety   . Chronic headaches   . Diabetes mellitus without complication ([place])   . GERD (gastroesophageal reflux disease)   . High cholesterol   . Hypertension   . Mental developmental delay   . Panic attacks     There are no active problems to display for this patient.   Past Surgical History:  Procedure Laterality Date  . HAND SURGERY    . KIDNEY STONE SURGERY      Prior to Admission medications   Medication Sig Start Date End Date Taking? Authorizing Provider  albuterol (PROVENTIL HFA;VENTOLIN HFA) 108 (90 BASE) MCG/ACT inhaler Inhale 2 puffs into the lungs every 6 (six) hours as needed for wheezing or shortness of breath. 11/07/15   Orbie Pyo, MD  brompheniramine-pseudoephedrine-DM 30-2-10  MG/5ML syrup Take 10 mLs by mouth 4 (four) times daily as needed. 11/26/16   Cuthriell, Charline Bills, PA-C  ibuprofen (ADVIL,MOTRIN) 600 MG tablet Take 1 tablet (600 mg total) by mouth every 6 (six) hours as needed. 01/23/17   Triplett, Johnette Abraham B, FNP  loperamide (IMODIUM A-D) 2 MG tablet Take 1 tablet (2 mg total) by mouth 4 (four) times daily as needed for diarrhea or loose stools. 06/12/16   Eula Listen, MD  LORazepam (ATIVAN) 1 MG tablet Take 1 tablet (1 mg total) by mouth every 8 (eight) hours as needed for anxiety. 02/03/16   Paulette Blanch, MD  predniSONE (DELTASONE) 10 MG tablet Take 1 tablet (10 mg total) by mouth daily. 11/26/16   Cuthriell, Charline Bills, PA-C  sucralfate (CARAFATE) 1 g tablet Take 1 tablet (1 g total) by mouth 2 (two) times daily. 07/28/17   Loney Hering, MD    Allergies Patient has no known allergies.  Family History  Problem Relation Age of Onset  . Breast cancer Mother 69       pre pt.     Social History Social History  Substance Use Topics  . Smoking status: Never Smoker  . Smokeless tobacco: Never Used  . Alcohol use No    Review of Systems  Constitutional: No fever/chills Eyes: No visual changes. ENT: No sore throat. Cardiovascular:  chest pain. Respiratory: shortness of breath. Gastrointestinal: Nausea, No abdominal pain.  no vomiting.  No diarrhea.  No constipation. Genitourinary: Negative for dysuria. Musculoskeletal: Negative for back pain. Skin: Negative for rash. Neurological: Negative for headaches, focal weakness or numbness.   ____________________________________________   PHYSICAL EXAM:  VITAL SIGNS: ED Triage Vitals  Enc Vitals Group     BP 07/27/17 2147 (!) 153/92     Pulse Rate 07/27/17 2147 60     Resp 07/27/17 2147 16     Temp 07/27/17 2147 97.7 F (36.5 C)     Temp Source 07/27/17 2147 Oral     SpO2 07/27/17 2147 96 %     Weight --      Height --      Head Circumference --      Peak Flow --      Pain Score  07/27/17 2149 10     Pain Loc --      Pain Edu? --      Excl. in [place]? --     Constitutional: Alert and oriented. Well appearing and in mild distress. Eyes: Conjunctivae are normal. PERRL. EOMI. Head: Atraumatic. Nose: No congestion/rhinnorhea. Mouth/Throat: Mucous membranes are moist.  Oropharynx non-erythematous. Cardiovascular: Normal rate, regular rhythm. Grossly normal heart sounds.  Good peripheral circulation. Respiratory: Normal respiratory effort.  No retractions. Lungs CTAB. Gastrointestinal: Soft and nontender. No distention. Positive bowel sounds Musculoskeletal: No lower extremity tenderness nor edema.   Neurologic:  Normal speech and language.   Skin:  Skin is warm, dry and intact.  Psychiatric: Mood and affect are normal. Speech and behavior are normal.  ____________________________________________   LABS (all labs ordered are listed, but only abnormal results are displayed)  Labs Reviewed  BASIC METABOLIC PANEL - Abnormal; Notable for the following:       Result Value   CO2 21 (*)    Glucose, Bld 113 (*)    All other components within normal limits  CBC  TROPONIN I  TROPONIN I   ____________________________________________  EKG  ED ECG REPORT I, Loney Hering, the attending physician, personally viewed and interpreted this ECG.   Date: 07/27/2017  EKG Time: 2149  Rate: 57  Rhythm: normal sinus rhythm  Axis: normal  Intervals:none  ST&T Change: none  ____________________________________________  RADIOLOGY  Dg Chest 2 View  Result Date: 07/27/2017 CLINICAL DATA:  Central chest pain. EXAM: CHEST  2 VIEW COMPARISON:  11/26/2016 FINDINGS: The cardiomediastinal contours are normal. Mild bronchial thickening. Pulmonary vasculature is normal. No consolidation, pleural effusion, or pneumothorax. No acute osseous abnormalities are seen. IMPRESSION: Mild bronchial thickening.  Otherwise clear lungs. Electronically Signed   By: Jeb Levering M.D.    On: 07/27/2017 22:16    ____________________________________________   PROCEDURES  Procedure(s) performed: None  Procedures  Critical Care performed: No  ____________________________________________   INITIAL IMPRESSION / ASSESSMENT AND PLAN / ED COURSE  Pertinent labs & imaging results that were available during my care of the patient were reviewed by me and considered in my medical decision making (see chart for details).   This is a 51 year old who comes into the hospital today with some chest pain. She reports that she's been having this pain since yesterday. The patient had some blood work done which was unremarkable. She also had a chest x-ray which is negative. We did 2 sets of troponins which were also negative. The patient received GI cocktail and a shot of Toradol. Her pain improved significantly. I will discharge the patient home to have her follow-up with her primary care physician  as well as cardiology. The patient has no further complaints or concerns and she'll be discharged home.      ____________________________________________   FINAL CLINICAL IMPRESSION(S) / ED DIAGNOSES  Final diagnoses:  Chest pain, unspecified type      NEW MEDICATIONS STARTED DURING THIS VISIT:  Discharge Medication List as of 07/28/2017  6:12 AM    START taking these medications   Details  sucralfate (CARAFATE) 1 g tablet Take 1 tablet (1 g total) by mouth 2 (two) times daily., Starting Sat 07/28/2017, Print         Note:  This document was prepared using Dragon voice recognition software and may include unintentional dictation errors.    Loney Hering, MD 07/28/17 [phone]

## 2017-08-16 ENCOUNTER — Other Ambulatory Visit: Payer: Self-pay | Admitting: Nurse Practitioner

## 2017-08-16 DIAGNOSIS — Z1231 Encounter for screening mammogram for malignant neoplasm of breast: Secondary | ICD-10-CM

## 2017-08-17 ENCOUNTER — Emergency Department: Payer: Medicare Other

## 2017-08-17 ENCOUNTER — Inpatient Hospital Stay
Admission: EM | Admit: 2017-08-17 | Discharge: 2017-08-18 | DRG: 303 | Disposition: A | Discharge status: Home / Self Care | Payer: Medicare Other | Attending: Specialist | Admitting: Specialist

## 2017-08-17 DIAGNOSIS — J45909 Unspecified asthma, uncomplicated: Secondary | ICD-10-CM | POA: Diagnosis present

## 2017-08-17 DIAGNOSIS — F410 Panic disorder [episodic paroxysmal anxiety] without agoraphobia: Secondary | ICD-10-CM | POA: Diagnosis present

## 2017-08-17 DIAGNOSIS — Z7902 Long term (current) use of antithrombotics/antiplatelets: Secondary | ICD-10-CM | POA: Diagnosis not present

## 2017-08-17 DIAGNOSIS — E785 Hyperlipidemia, unspecified: Secondary | ICD-10-CM | POA: Diagnosis present

## 2017-08-17 DIAGNOSIS — I25118 Atherosclerotic heart disease of native coronary artery with other forms of angina pectoris: Secondary | ICD-10-CM | POA: Diagnosis present

## 2017-08-17 DIAGNOSIS — E1142 Type 2 diabetes mellitus with diabetic polyneuropathy: Secondary | ICD-10-CM | POA: Diagnosis present

## 2017-08-17 DIAGNOSIS — Z79899 Other long term (current) drug therapy: Secondary | ICD-10-CM | POA: Diagnosis not present

## 2017-08-17 DIAGNOSIS — G629 Polyneuropathy, unspecified: Secondary | ICD-10-CM | POA: Diagnosis present

## 2017-08-17 DIAGNOSIS — Z955 Presence of coronary angioplasty implant and graft: Secondary | ICD-10-CM

## 2017-08-17 DIAGNOSIS — F329 Major depressive disorder, single episode, unspecified: Secondary | ICD-10-CM | POA: Diagnosis present

## 2017-08-17 DIAGNOSIS — Z7951 Long term (current) use of inhaled steroids: Secondary | ICD-10-CM | POA: Diagnosis not present

## 2017-08-17 DIAGNOSIS — F419 Anxiety disorder, unspecified: Secondary | ICD-10-CM | POA: Diagnosis present

## 2017-08-17 DIAGNOSIS — F819 Developmental disorder of scholastic skills, unspecified: Secondary | ICD-10-CM | POA: Diagnosis present

## 2017-08-17 DIAGNOSIS — I252 Old myocardial infarction: Secondary | ICD-10-CM | POA: Diagnosis not present

## 2017-08-17 DIAGNOSIS — R51 Headache: Secondary | ICD-10-CM | POA: Diagnosis present

## 2017-08-17 DIAGNOSIS — K219 Gastro-esophageal reflux disease without esophagitis: Secondary | ICD-10-CM | POA: Diagnosis present

## 2017-08-17 DIAGNOSIS — E119 Type 2 diabetes mellitus without complications: Secondary | ICD-10-CM

## 2017-08-17 DIAGNOSIS — I200 Unstable angina: Secondary | ICD-10-CM | POA: Diagnosis present

## 2017-08-17 DIAGNOSIS — I10 Essential (primary) hypertension: Secondary | ICD-10-CM | POA: Diagnosis present

## 2017-08-17 LAB — T4, FREE: Free T4: 0.67 ng/dL (ref 0.61–1.12)

## 2017-08-17 LAB — PROTIME-INR
INR: 0.98
Prothrombin Time: 12.9 seconds (ref 11.4–15.2)

## 2017-08-17 LAB — BASIC METABOLIC PANEL
ANION GAP: 10 (ref 5–15)
BUN: 11 mg/dL (ref 6–20)
CO2: 24 mmol/L (ref 22–32)
Calcium: 9.5 mg/dL (ref 8.9–10.3)
Chloride: 108 mmol/L (ref 101–111)
Creatinine, Ser: 0.5 mg/dL (ref 0.44–1.00)
GFR calc Af Amer: >60 mL/min (ref >60)
GFR calc non Af Amer: >60 mL/min (ref >60)
GLUCOSE: 153 mg/dL — AB (ref 65–99)
POTASSIUM: 3.9 mmol/L (ref 3.5–5.1)
Sodium: 142 mmol/L (ref 135–145)

## 2017-08-17 LAB — GLUCOSE, CAPILLARY: GLUCOSE-CAPILLARY: 138 mg/dL — AB (ref 65–99)

## 2017-08-17 LAB — TSH: TSH: 2.155 u[IU]/mL (ref 0.350–4.500)

## 2017-08-17 LAB — CBC
HEMATOCRIT: 36.5 % (ref 35.0–47.0)
HEMOGLOBIN: 12.9 g/dL (ref 12.0–16.0)
MCH: 30.5 pg (ref 26.0–34.0)
MCHC: 35.2 g/dL (ref 32.0–36.0)
MCV: 86.5 fL (ref 80.0–100.0)
Platelets: 305 10*3/uL (ref 150–440)
RBC: 4.22 MIL/uL (ref 3.80–5.20)
RDW: 13.3 % (ref 11.5–14.5)
WBC: 6.2 10*3/uL (ref 3.6–11.0)

## 2017-08-17 LAB — TROPONIN I: Troponin I: <0.03 ng/mL (ref <0.03)

## 2017-08-17 LAB — APTT: aPTT: 26 seconds (ref 24–36)

## 2017-08-17 MED ORDER — BUPROPION HCL ER (SR) 100 MG PO TB12
100.0000 mg | ORAL_TABLET | Freq: Two times a day (BID) | ORAL | Status: DC
  Administered 2017-08-18: 100 mg via ORAL
  Filled 2017-08-17 (×3): qty 1

## 2017-08-17 MED ORDER — HEPARIN BOLUS VIA INFUSION
4000.0000 [IU] | Freq: Once | INTRAVENOUS | Status: AC
  Administered 2017-08-17: 4000 [IU] via INTRAVENOUS
  Filled 2017-08-17: qty 4000

## 2017-08-17 MED ORDER — SODIUM CHLORIDE 0.9% FLUSH
3.0000 mL | Freq: Two times a day (BID) | INTRAVENOUS | Status: DC
  Administered 2017-08-17 – 2017-08-18 (×2): 3 mL via INTRAVENOUS

## 2017-08-17 MED ORDER — NITROGLYCERIN 0.4 MG SL SUBL: 0.4000 mg | SUBLINGUAL_TABLET | SUBLINGUAL | Status: DC | PRN

## 2017-08-17 MED ORDER — ONDANSETRON HCL 4 MG/2ML IJ SOLN
4.0000 mg | Freq: Four times a day (QID) | INTRAMUSCULAR | Status: DC | PRN
Start: 2017-08-17 — End: 2017-08-18

## 2017-08-17 MED ORDER — SODIUM CHLORIDE 0.9% FLUSH: 3.0000 mL | INTRAVENOUS | Status: DC | PRN

## 2017-08-17 MED ORDER — ASPIRIN EC 81 MG PO TBEC
81.0000 mg | DELAYED_RELEASE_TABLET | Freq: Every day | ORAL | Status: DC
  Administered 2017-08-18: 81 mg via ORAL
  Filled 2017-08-17: qty 1

## 2017-08-17 MED ORDER — BUSPIRONE HCL 5 MG PO TABS
30.0000 mg | ORAL_TABLET | Freq: Two times a day (BID) | ORAL | Status: DC
  Administered 2017-08-18: 30 mg via ORAL
  Filled 2017-08-17: qty 6

## 2017-08-17 MED ORDER — LORAZEPAM 1 MG PO TABS
1.0000 mg | ORAL_TABLET | Freq: Three times a day (TID) | ORAL | Status: DC | PRN
  Administered 2017-08-18: 1 mg via ORAL
  Filled 2017-08-17: qty 1

## 2017-08-17 MED ORDER — SENNOSIDES-DOCUSATE SODIUM 8.6-50 MG PO TABS
1.0000 | ORAL_TABLET | Freq: Every day | ORAL | Status: DC
  Administered 2017-08-18: 1 via ORAL
  Filled 2017-08-17: qty 1

## 2017-08-17 MED ORDER — CLOPIDOGREL BISULFATE 75 MG PO TABS
75.0000 mg | ORAL_TABLET | Freq: Every day | ORAL | Status: DC
Start: 2017-08-18 — End: 2017-08-18
  Administered 2017-08-18: 75 mg via ORAL
  Filled 2017-08-17: qty 1

## 2017-08-17 MED ORDER — PROPRANOLOL HCL 20 MG PO TABS: 20.0000 mg | ORAL_TABLET | Freq: Two times a day (BID) | ORAL | Status: DC

## 2017-08-17 MED ORDER — VITAMIN D 1000 UNITS PO TABS
1000.0000 [IU] | ORAL_TABLET | Freq: Every day | ORAL | Status: DC
  Administered 2017-08-18: 1000 [IU] via ORAL
  Filled 2017-08-17: qty 1

## 2017-08-17 MED ORDER — HEPARIN (PORCINE) IN NACL 100-0.45 UNIT/ML-% IJ SOLN
1100.0000 [IU]/h | INTRAMUSCULAR | Status: DC
  Administered 2017-08-17: 950 [IU]/h via INTRAVENOUS
  Filled 2017-08-17: qty 250

## 2017-08-17 MED ORDER — PANTOPRAZOLE SODIUM 40 MG PO TBEC
40.0000 mg | DELAYED_RELEASE_TABLET | Freq: Every day | ORAL | Status: DC
Start: 2017-08-18 — End: 2017-08-18
  Administered 2017-08-18: 40 mg via ORAL
  Filled 2017-08-17: qty 1

## 2017-08-17 MED ORDER — PROPRANOLOL HCL 20 MG PO TABS
20.0000 mg | ORAL_TABLET | Freq: Every evening | ORAL | Status: DC
  Filled 2017-08-17: qty 1

## 2017-08-17 MED ORDER — LINACLOTIDE 145 MCG PO CAPS
145.0000 ug | ORAL_CAPSULE | Freq: Every day | ORAL | Status: DC
  Administered 2017-08-18: 145 ug via ORAL
  Filled 2017-08-17: qty 1

## 2017-08-17 MED ORDER — NITROGLYCERIN 0.4 MG SL SUBL
SUBLINGUAL_TABLET | SUBLINGUAL | Status: AC
  Administered 2017-08-17: 22:00:00
  Filled 2017-08-17: qty 1

## 2017-08-17 MED ORDER — PROPRANOLOL HCL 40 MG PO TABS
40.0000 mg | ORAL_TABLET | Freq: Every morning | ORAL | Status: DC
  Administered 2017-08-18: 40 mg via ORAL
  Filled 2017-08-17: qty 1

## 2017-08-17 MED ORDER — ASPIRIN 81 MG PO CHEW
324.0000 mg | CHEWABLE_TABLET | Freq: Once | ORAL | Status: AC
  Administered 2017-08-17: 324 mg via ORAL
  Filled 2017-08-17: qty 4

## 2017-08-17 MED ORDER — IPRATROPIUM-ALBUTEROL 0.5-2.5 (3) MG/3ML IN SOLN: 3.0000 mL | Freq: Four times a day (QID) | RESPIRATORY_TRACT | Status: DC | PRN

## 2017-08-17 MED ORDER — CITALOPRAM HYDROBROMIDE 20 MG PO TABS
20.0000 mg | ORAL_TABLET | Freq: Every day | ORAL | Status: DC
  Administered 2017-08-18: 20 mg via ORAL
  Filled 2017-08-17: qty 1

## 2017-08-17 MED ORDER — SODIUM CHLORIDE 0.9 % IV SOLN: 250.0000 mL | INTRAVENOUS | Status: DC | PRN

## 2017-08-17 MED ORDER — SIMVASTATIN 20 MG PO TABS: 20.0000 mg | ORAL_TABLET | Freq: Every day | ORAL | Status: DC

## 2017-08-17 MED ORDER — POLYETHYLENE GLYCOL 3350 17 G PO PACK
17.0000 g | PACK | ORAL | Status: DC
  Administered 2017-08-18: 17 g via ORAL
  Filled 2017-08-17: qty 1

## 2017-08-17 MED ORDER — GABAPENTIN 400 MG PO CAPS
400.0000 mg | ORAL_CAPSULE | Freq: Three times a day (TID) | ORAL | Status: DC
  Administered 2017-08-18: 400 mg via ORAL
  Filled 2017-08-17: qty 1

## 2017-08-17 MED ORDER — INSULIN ASPART 100 UNIT/ML ~~LOC~~ SOLN
0.0000 [IU] | Freq: Three times a day (TID) | SUBCUTANEOUS | Status: DC
  Administered 2017-08-18: 2 [IU] via SUBCUTANEOUS
  Filled 2017-08-17: qty 1

## 2017-08-17 MED ORDER — MORPHINE SULFATE (PF) 2 MG/ML IV SOLN
1.0000 mg | INTRAVENOUS | Status: DC | PRN
  Administered 2017-08-18 (×2): 1 mg via INTRAVENOUS
  Filled 2017-08-17 (×2): qty 1

## 2017-08-17 MED ORDER — FLUTICASONE PROPIONATE 50 MCG/ACT NA SUSP
1.0000 | Freq: Every day | NASAL | Status: DC
  Administered 2017-08-18: 1 via NASAL
  Filled 2017-08-17: qty 16

## 2017-08-17 MED ORDER — LOSARTAN POTASSIUM 50 MG PO TABS
100.0000 mg | ORAL_TABLET | Freq: Every day | ORAL | Status: DC
  Administered 2017-08-18: 100 mg via ORAL
  Filled 2017-08-17 (×2): qty 2

## 2017-08-17 MED ORDER — MOMETASONE FURO-FORMOTEROL FUM 200-5 MCG/ACT IN AERO
2.0000 | INHALATION_SPRAY | Freq: Two times a day (BID) | RESPIRATORY_TRACT | Status: DC
  Administered 2017-08-18: 2 via RESPIRATORY_TRACT
  Filled 2017-08-17: qty 8.8

## 2017-08-17 MED ORDER — BENZONATATE 100 MG PO CAPS: 100.0000 mg | ORAL_CAPSULE | Freq: Three times a day (TID) | ORAL | Status: DC | PRN

## 2017-08-17 MED ORDER — LORATADINE 10 MG PO TABS
10.0000 mg | ORAL_TABLET | Freq: Every day | ORAL | Status: DC
Start: 2017-08-18 — End: 2017-08-18
  Administered 2017-08-18: 10 mg via ORAL
  Filled 2017-08-17: qty 1

## 2017-08-17 MED ORDER — INSULIN ASPART 100 UNIT/ML ~~LOC~~ SOLN: 0.0000 [IU] | Freq: Every day | SUBCUTANEOUS | Status: DC

## 2017-08-17 MED ORDER — AMLODIPINE BESYLATE 5 MG PO TABS
5.0000 mg | ORAL_TABLET | Freq: Every day | ORAL | Status: DC
  Administered 2017-08-18: 5 mg via ORAL
  Filled 2017-08-17: qty 1

## 2017-08-17 MED ORDER — ACETAMINOPHEN 325 MG PO TABS: 650.0000 mg | ORAL_TABLET | ORAL | Status: DC | PRN

## 2017-08-17 NOTE — ED Triage Notes (Signed)
Pt presents c/o chest pain x2 days. Reports seen by cardiology with recent stress test that was "not normal". Reports seeing cardiologist yesterday. Pt reports pain pressure/soreness to mid chest. + SOB.

## 2017-08-17 NOTE — Progress Notes (Signed)
ANTICOAGULATION CONSULT NOTE - Initial Consult  Pharmacy Consult for heparin Indication: chest pain/ACS  No Known Allergies  Patient Measurements: Height: 5\' 2"  (157.5 cm) Weight: 176 lb (79.8 kg) IBW/kg (Calculated) : 50.1 Heparin Dosing Weight: 79.8 kg  Vital Signs: Temp: 99.2 F (37.3 C) (09/14 2059) Temp Source: Oral (09/14 2059) BP: 133/100 (09/14 2130) Pulse Rate: 61 (09/14 2130)  Labs:  Recent Labs  08/17/17 2101  HGB 12.9  HCT 36.5  PLT 305  CREATININE 0.50  TROPONINI <0.03    Estimated Creatinine Clearance: 83.3 mL/min (by C-G formula based on SCr of 0.5 mg/dL).   Medical History: Past Medical History:  Diagnosis Date  . Anxiety   . Chronic headaches   . Diabetes mellitus without complication ([place])   . GERD (gastroesophageal reflux disease)   . High cholesterol   . Hypertension   . Mental developmental delay   . Panic attacks     Medications:  Scheduled:  . heparin  4,000 Units Intravenous Once    Assessment: Patient admitted for c/o CP, trops currently NG, but EKG has some changes. Patient is being started on heparin drip for ACS  Goal of Therapy:  Heparin level 0.3-0.7 units/ml Monitor platelets by anticoagulation protocol: Yes   Plan:  Give 4000 units bolus x 1  Will start heparin infusion @ 950 units/hr and will check HL @ 0500. Baseline labs ordered. Will monitor CBC and HLs and adjust as needed.  Tobie Lords, PharmD, BCPS Clinical Pharmacist 08/17/2017

## 2017-08-17 NOTE — H&P (Signed)
[facility] Admission History and Physical Harvie Bridge, D.O.  ---------------------------------------------------------------------------------------------------------------------   PATIENT NAME: Hannah Clarke MR#: [id number] DATE OF BIRTH: Jun 18, 1966 DATE OF ADMISSION: 08/17/2017 PRIMARY CARE PHYSICIAN: Danelle Berry, NP  REQUESTING/REFERRING PHYSICIAN: ED Dr. Mable Paris  CHIEF COMPLAINT: Chief Complaint  Patient presents with  . Chest Pain    HISTORY OF PRESENT ILLNESS: Hannah Clarke is a 51 y.o. female with a known history of CAD status post MI with stents, anxiety, headaches, diabetes, GERD, hypertension, hyperlipidemia, developmental delay presents to the emergency department for evaluation of chest pain.  Patient was in a usual state of health until several days ago when she describes the onset of substernal chest pressure which was localized, nonradiating, associated with some mild anxiety but no diaphoresis, palpitations, shortness of breath or nausea. Apparently patient had a stress test yesterday with Dr. Humphrey Rolls.  Otherwise there has been no change in status. Patient has been taking medication as prescribed and there has been no recent change in medication or diet.  There has been no recent illness, travel or sick contacts.    Patient denies fevers/chills, weakness, dizziness, shortness of breath, N/V/C/D, abdominal pain, dysuria/frequency, changes in mental status.   EMS/ED COURSE:   Patient received aspirin, heparin. Medical admission is requested for further management and workup of unstable angina.  PAST MEDICAL HISTORY: Past Medical History:  Diagnosis Date  . Anxiety   . Chronic headaches   . Diabetes mellitus without complication ([place])   . GERD (gastroesophageal reflux disease)   . High cholesterol   . Hypertension   . Mental developmental delay   . Panic attacks       PAST SURGICAL HISTORY: Past Surgical History:  Procedure  Laterality Date  . HAND SURGERY    . KIDNEY STONE SURGERY        SOCIAL HISTORY: Social History  Substance Use Topics  . Smoking status: Never Smoker  . Smokeless tobacco: Never Used  . Alcohol use No      FAMILY HISTORY: Family History  Problem Relation Age of Onset  . Breast cancer Mother 62       pre pt.      MEDICATIONS AT HOME: Prior to Admission medications   Medication Sig Start Date End Date Taking? Authorizing Provider  acetaminophen (TYLENOL) 325 MG tablet Take 325 mg by mouth every 6 (six) hours as needed.   Yes [provider]  albuterol (PROVENTIL HFA;VENTOLIN HFA) 108 (90 BASE) MCG/ACT inhaler Inhale 2 puffs into the lungs every 6 (six) hours as needed for wheezing or shortness of breath. 11/07/15  Yes Orbie Pyo, MD  amLODipine (NORVASC) 5 MG tablet Take 5 mg by mouth daily.   Yes [provider]  benzonatate (TESSALON) 100 MG capsule Take 100 mg by mouth 3 (three) times daily as needed for cough.   Yes [provider]  buPROPion (WELLBUTRIN SR) 100 MG 12 hr tablet Take 100 mg by mouth 2 (two) times daily.   Yes [provider]  busPIRone (BUSPAR) 30 MG tablet Take 30 mg by mouth 2 (two) times daily.   Yes [provider]  cetirizine (ZYRTEC) 10 MG tablet Take 10 mg by mouth daily.   Yes [provider]  cholecalciferol (VITAMIN D) 1000 units tablet Take 1,000 Units by mouth daily.   Yes [provider]  citalopram (CELEXA) 20 MG tablet Take 20 mg by mouth daily.   Yes [provider]  clopidogrel (PLAVIX) 75  MG tablet Take 75 mg by mouth daily.   Yes [provider]  dexlansoprazole (DEXILANT) 60 MG capsule Take 60 mg by mouth daily.   Yes [provider]  fluticasone (FLONASE) 50 MCG/ACT nasal spray Place 1 spray into both nostrils daily.   Yes [provider]  Fluticasone-Salmeterol (ADVAIR) 250-50 MCG/DOSE AEPB Inhale 1 puff into the lungs 2 (two)  times daily.   Yes [provider]  gabapentin (NEURONTIN) 400 MG capsule Take 400 mg by mouth 3 (three) times daily.   Yes [provider]  ibuprofen (ADVIL,MOTRIN) 600 MG tablet Take 1 tablet (600 mg total) by mouth every 6 (six) hours as needed. 01/23/17  Yes Triplett, Cari B, FNP  linaclotide (LINZESS) 145 MCG CAPS capsule Take 145 mcg by mouth daily before breakfast.   Yes [provider]  LORazepam (ATIVAN) 1 MG tablet Take 1 tablet (1 mg total) by mouth every 8 (eight) hours as needed for anxiety. 02/03/16  Yes Paulette Blanch, MD  losartan (COZAAR) 100 MG tablet Take 100 mg by mouth daily.   Yes [provider]  medroxyPROGESTERone (DEPO-PROVERA) 150 MG/ML injection Inject 150 mg into the muscle every 3 (three) months.   Yes [provider]  metFORMIN (GLUCOPHAGE) 500 MG tablet Take 500 mg by mouth 2 (two) times daily with a meal.   Yes [provider]  ondansetron (ZOFRAN) 8 MG tablet Take 8 mg by mouth every 8 (eight) hours as needed for nausea or vomiting.   Yes [provider]  polyethylene glycol (MIRALAX / GLYCOLAX) packet Take 17 g by mouth every other day.   Yes [provider]  propranolol (INDERAL) 40 MG tablet Take 20-40 mg by mouth 2 (two) times daily. Take 40 mg in the am and 20 mg at bedtime   Yes [provider]  sennosides-docusate sodium (SENOKOT-S) 8.6-50 MG tablet Take 1 tablet by mouth daily.   Yes [provider]  simvastatin (ZOCOR) 20 MG tablet Take 20 mg by mouth daily.   Yes [provider]  brompheniramine-pseudoephedrine-DM 30-2-10 MG/5ML syrup Take 10 mLs by mouth 4 (four) times daily as needed. 11/26/16   Cuthriell, Charline Bills, PA-C  sucralfate (CARAFATE) 1 g tablet Take 1 tablet (1 g total) by mouth 2 (two) times daily. Patient not taking: Reported on 08/17/2017 07/28/17   Loney Hering, MD      DRUG ALLERGIES: No Known Allergies   REVIEW OF  SYSTEMS: CONSTITUTIONAL: No fatigue, weakness, fever, chills, weight gain/loss, headache EYES: No blurry or double vision. ENT: No tinnitus, postnasal drip, redness or soreness of the oropharynx. RESPIRATORY: No dyspnea, cough, wheeze, hemoptysis. CARDIOVASCULAR: Positive chest pain, negative orthopnea, palpitations, syncope. GASTROINTESTINAL: No nausea, vomiting, constipation, diarrhea, abdominal pain. No hematemesis, melena or hematochezia. GENITOURINARY: No dysuria, frequency, hematuria. ENDOCRINE: No polyuria or nocturia. No heat or cold intolerance. HEMATOLOGY: No anemia, bruising, bleeding. INTEGUMENTARY: No rashes, ulcers, lesions. MUSCULOSKELETAL: No pain, arthritis, swelling, gout. NEUROLOGIC: No numbness, tingling, weakness or ataxia. No seizure-type activity. PSYCHIATRIC: No anxiety, depression, insomnia.  PHYSICAL EXAMINATION: VITAL SIGNS: Blood pressure (!) 133/100, pulse 61, temperature 99.2 F (37.3 C), temperature source Oral, resp. rate 20, height 5\' 2"  (1.575 m), weight 79.8 kg (176 lb), SpO2 98 %.  GENERAL: 51 y.o.-year-old female patient, well-developed, well-nourished lying in the bed in no acute distress.  Pleasant and cooperative.   HEENT: Head atraumatic, normocephalic. Pupils equal, round, reactive to light and accommodation. No scleral icterus. Extraocular muscles intact. Oropharynx is  clear. Mucus membranes moist. NECK: Supple, full range of motion. No JVD, no bruit heard. No cervical lymphadenopathy. CHEST: Normal breath sounds bilaterally. No wheezing, rales, rhonchi or crackles. No use of accessory muscles of respiration.  No reproducible chest wall tenderness.  CARDIOVASCULAR: S1, S2 normal. No murmurs, rubs, or gallops appreciated. Cap refill <2 seconds. ABDOMEN: Soft, nontender, nondistended. No rebound, guarding, rigidity. Normoactive bowel sounds present in all four quadrants. No organomegaly or mass. EXTREMITIES: Full range of motion. No pedal edema,  cyanosis, or clubbing. NEUROLOGIC: Cranial nerves II through XII are grossly intact with no focal sensorimotor deficit. Muscle strength 5/5 in all extremities. Sensation intact. Gait not checked. PSYCHIATRIC: The patient is alert and oriented x 3. Normal affect, mood, thought content. SKIN: Warm, dry, and intact without obvious rash, lesion, or ulcer.  LABORATORY PANEL:  CBC  Recent Labs Lab 08/17/17 2101  WBC 6.2  HGB 12.9  HCT 36.5  PLT 305   ----------------------------------------------------------------------------------------------------------------- Chemistries  Recent Labs Lab 08/17/17 2101  NA 142  K 3.9  CL 108  CO2 24  GLUCOSE 153*  BUN 11  CREATININE 0.50  CALCIUM 9.5   ------------------------------------------------------------------------------------------------------------------ Cardiac Enzymes  Recent Labs Lab 08/17/17 2101  TROPONINI <0.03   ------------------------------------------------------------------------------------------------------------------  RADIOLOGY: Dg Chest 2 View  Result Date: 08/17/2017 CLINICAL DATA:  Chest pain for 2 days EXAM: CHEST  2 VIEW COMPARISON:  07/28/07 FINDINGS: The heart size and mediastinal contours are within normal limits. Both lungs are clear. Bronchial wall thickening is again noted. The visualized skeletal structures are unremarkable. IMPRESSION: No active cardiopulmonary disease. Chronic bronchial wall thickening. Electronically Signed   By: Kerby Moors M.D.   On: 08/17/2017 21:45    EKG: Normal sinus rhythm at 60 bpm with normal axis. There are ST segment depressions in leads V3 4 and 5.  IMPRESSION AND PLAN:  This is a 51 y.o. female with a history of CAD status post MI with stents, anxiety, headaches, diabetes, GERD, hypertension, hyperlipidemia  now being admitted with:  1. Unstable Angina - Admit to inpatient with telemetry monitoring. - Trend troponins, check lipids and TSH. - Morphine, nitro,  beta blocker, aspirin and statin ordered. - Continue Plavix - Continue heparin drip   - Check echo - NPO after midnight - Cardiology consult requested.   2. H/O HTN - Continue Norvasc, Cozaar, propranolol  3. H/O Anxiety - Continue Wellbutrin, Buspar, Celexa, Ativan  4. History of of allergies, asthma - Continue Claritin, Flonase, Dulera, - O2 and nebs as needed  5. History of GERD - Continue Protonix for Dexilant  6. History of HLD - Continue Zocor  7. History of DM - Accuchecks achs with RISS - Hold metformin  Admission status: Observation, telemetry Diet/Nutrition: Heart healthy, carb controlled Fluids: HL DVT Px: Heparin, SCDs and early ambulation Code Status: Full Disposition Plan: To home in <24 hours  All the records are reviewed and case discussed with ED provider. Management plans discussed with the patient and/or family who express understanding and agree with plan of care.   TOTAL TIME TAKING CARE OF THIS PATIENT: 60 minutes.   Hannah Clarke D.O. on 08/17/2017 at 9:51 PM Between 7am to 6pm - Pager - [phone] After 6pm go to [email or web address] - [employer] [facility] Office [phone] CC: Primary care physician; Danelle Berry, NP     Note: This dictation was prepared with Dragon dictation along with smaller phrase technology. Any transcriptional errors that result from this process are unintentional.

## 2017-08-17 NOTE — ED Provider Notes (Signed)
The patient's EKG shows T-wave inversion in V1 as well as ST depression in V2 V3 V4 V5 V6 which is new compared to EKG done 3 weeks ago. We'll give her an aspirin to bring her straight back.   Darel Hong, MD 08/17/17 2106

## 2017-08-17 NOTE — ED Provider Notes (Signed)
[facility] Emergency Department Provider Note  ____________________________________________   First MD Initiated Contact with Patient 08/17/17 2107     (approximate)  I have reviewed the triage vital signs and the nursing notes.   HISTORY  Chief Complaint Chest Pain    HPI Hannah Clarke is a 51 y.o. female who comes to the emergency departmentwith several days of progressive exertional substernal chest pain. It is associated with shortness of breath. The patient does have a previous history of diabetes as well as myocardial infarction that has been stented. She takes Plavix daily. Her cardiologist is Dr. Humphrey Rolls. She said she had a stress test yesterday at his office although she does not know the results. She has never had a pulmonary embolism. Her pain is not ripping or tearing and does not go straight to her back.   Past Medical History:  Diagnosis Date  . Anxiety   . Chronic headaches   . Diabetes mellitus without complication ([place])   . GERD (gastroesophageal reflux disease)   . High cholesterol   . Hypertension   . Mental developmental delay   . Panic attacks     Patient Active Problem List   Diagnosis Date Noted  . Unstable angina ([place]) 08/17/2017    Past Surgical History:  Procedure Laterality Date  . HAND SURGERY    . KIDNEY STONE SURGERY      Prior to Admission medications   Medication Sig Start Date End Date Taking? Authorizing Provider  acetaminophen (TYLENOL) 325 MG tablet Take 325 mg by mouth every 6 (six) hours as needed.   Yes [provider]  albuterol (PROVENTIL HFA;VENTOLIN HFA) 108 (90 BASE) MCG/ACT inhaler Inhale 2 puffs into the lungs every 6 (six) hours as needed for wheezing or shortness of breath. 11/07/15  Yes Orbie Pyo, MD  amLODipine (NORVASC) 5 MG tablet Take 5 mg by mouth daily.   Yes [provider]  benzonatate (TESSALON) 100 MG capsule Take 100 mg by mouth 3 (three) times daily  as needed for cough.   Yes [provider]  buPROPion (WELLBUTRIN SR) 100 MG 12 hr tablet Take 100 mg by mouth 2 (two) times daily.   Yes [provider]  busPIRone (BUSPAR) 30 MG tablet Take 30 mg by mouth 2 (two) times daily.   Yes [provider]  cetirizine (ZYRTEC) 10 MG tablet Take 10 mg by mouth daily.   Yes [provider]  cholecalciferol (VITAMIN D) 1000 units tablet Take 1,000 Units by mouth daily.   Yes [provider]  citalopram (CELEXA) 20 MG tablet Take 20 mg by mouth daily.   Yes [provider]  clopidogrel (PLAVIX) 75 MG tablet Take 75 mg by mouth daily.   Yes [provider]  dexlansoprazole (DEXILANT) 60 MG capsule Take 60 mg by mouth daily.   Yes [provider]  fluticasone (FLONASE) 50 MCG/ACT nasal spray Place 1 spray into both nostrils daily.   Yes [provider]  Fluticasone-Salmeterol (ADVAIR) 250-50 MCG/DOSE AEPB Inhale 1 puff into the lungs 2 (two) times daily.   Yes [provider]  gabapentin (NEURONTIN) 400 MG capsule Take 400 mg by mouth 3 (three) times daily.   Yes [provider]  ibuprofen (ADVIL,MOTRIN) 600 MG tablet Take 1 tablet (600 mg total) by mouth every 6 (six) hours as needed. 01/23/17  Yes Triplett, Cari B, FNP  linaclotide (LINZESS) 145 MCG CAPS capsule Take 145 mcg by mouth daily before breakfast.  Yes [provider]  LORazepam (ATIVAN) 1 MG tablet Take 1 tablet (1 mg total) by mouth every 8 (eight) hours as needed for anxiety. 02/03/16  Yes Paulette Blanch, MD  losartan (COZAAR) 100 MG tablet Take 100 mg by mouth daily.   Yes [provider]  medroxyPROGESTERone (DEPO-PROVERA) 150 MG/ML injection Inject 150 mg into the muscle every 3 (three) months.   Yes [provider]  metFORMIN (GLUCOPHAGE) 500 MG tablet Take 500 mg by mouth 2 (two) times daily with a meal.   Yes [provider]  ondansetron (ZOFRAN) 8 MG tablet Take  8 mg by mouth every 8 (eight) hours as needed for nausea or vomiting.   Yes [provider]  polyethylene glycol (MIRALAX / GLYCOLAX) packet Take 17 g by mouth every other day.   Yes [provider]  propranolol (INDERAL) 40 MG tablet Take 20-40 mg by mouth 2 (two) times daily. Take 40 mg in the am and 20 mg at bedtime   Yes [provider]  sennosides-docusate sodium (SENOKOT-S) 8.6-50 MG tablet Take 1 tablet by mouth daily.   Yes [provider]  simvastatin (ZOCOR) 20 MG tablet Take 20 mg by mouth daily.   Yes [provider]  brompheniramine-pseudoephedrine-DM 30-2-10 MG/5ML syrup Take 10 mLs by mouth 4 (four) times daily as needed. 11/26/16   Cuthriell, Charline Bills, PA-C  sucralfate (CARAFATE) 1 g tablet Take 1 tablet (1 g total) by mouth 2 (two) times daily. Patient not taking: Reported on 08/17/2017 07/28/17   Loney Hering, MD    Allergies Patient has no known allergies.  Family History  Problem Relation Age of Onset  . Breast cancer Mother 71       pre pt.     Social History Social History  Substance Use Topics  . Smoking status: Never Smoker  . Smokeless tobacco: Never Used  . Alcohol use No    Review of Systems Constitutional: No fever/chills Eyes: No visual changes. ENT: No sore throat. Cardiovascular: Positive chest pain. Respiratory: Positive shortness of breath. Gastrointestinal: No abdominal pain.  No nausea, no vomiting.  No diarrhea.  No constipation. Genitourinary: Negative for dysuria. Musculoskeletal: Negative for back pain. Skin: Negative for rash. Neurological: Negative for headaches, focal weakness or numbness.   ____________________________________________   PHYSICAL EXAM:  VITAL SIGNS: ED Triage Vitals  Enc Vitals Group     BP 08/17/17 2059 (!) 132/97     Pulse Rate 08/17/17 2059 66     Resp 08/17/17 2059 14     Temp 08/17/17 2059 99.2 F (37.3 C)     Temp Source 08/17/17 2059 Oral     SpO2  08/17/17 2059 99 %     Weight 08/17/17 2059 176 lb (79.8 kg)     Height 08/17/17 2059 5\' 2"  (1.575 m)     Head Circumference --      Peak Flow --      Pain Score 08/17/17 2058 9     Pain Loc --      Pain Edu? --      Excl. in [place]? --     Constitutional: Pleasant cooperative clear cognitive delay Eyes: PERRL EOMI. Head: Atraumatic. Nose: No congestion/rhinnorhea. Mouth/Throat: No trismus Neck: No stridor.   Cardiovascular: Normal rate, regular rhythm. Grossly normal heart sounds.  Good peripheral circulation. Respiratory: Normal respiratory effort.  No retractions. Lungs CTAB and moving good air Gastrointestinal: Soft nontender Musculoskeletal: Legs are equal in size Neurologic:  No  gross focal neurologic deficits are appreciated. Skin:  Skin is warm, dry and intact. No rash noted. Psychiatric: Mood and affect are normal.     ____________________________________________   DIFFERENTIAL includes but not limited to  Non-ST elevation myocardial infarction, unstable angina, stable angina, pulmonary embolus, aortic dissection, pneumothorax, pneumonia ____________________________________________   LABS (all labs ordered are listed, but only abnormal results are displayed)  Labs Reviewed  BASIC METABOLIC PANEL - Abnormal; Notable for the following:       Result Value   Glucose, Bld 153 (*)    All other components within normal limits  CBC  TROPONIN I  APTT  PROTIME-INR  HEPARIN LEVEL (UNFRACTIONATED)  TSH  T4, FREE  HEMOGLOBIN B7J  BASIC METABOLIC PANEL  LIPID PANEL  CBC  PROTIME-INR  TROPONIN I  TROPONIN I  HIV ANTIBODY (ROUTINE TESTING)    First troponin negative __________________________________________  EKG  ED ECG REPORT I, Darel Hong, the attending physician, personally viewed and interpreted this ECG.  Date: 08/17/2017 EKG Time: 2057 Rate: 60 Rhythm: normal sinus rhythm QRS Axis: normal Intervals: normal ST/T Wave abnormalities: ST depression  in V3 V4 V5 with no reciprocal ST elevation concerning for acute ischemia Narrative Interpretation: EKG consistent with acute ischemia ________________________________________  RADIOLOGY  Chest x-ray with no acute disease ____________________________________________   PROCEDURES  Procedure(s) performed: no  Procedures  Critical Care performed:yes  CRITICAL CARE Performed by: Darel Hong   Total critical care time: 30 minutes  Critical care time was exclusive of separately billable procedures and treating other patients.  Critical care was necessary to treat or prevent imminent or life-threatening deterioration.  Critical care was time spent personally by me on the following activities: development of treatment plan with patient and/or surrogate as well as nursing, discussions with consultants, evaluation of patient's response to treatment, examination of patient, obtaining history from patient or surrogate, ordering and performing treatments and interventions, ordering and review of laboratory studies, ordering and review of radiographic studies, pulse oximetry and re-evaluation of patient's condition.   Observation: no ____________________________________________   INITIAL IMPRESSION / ASSESSMENT AND PLAN / ED COURSE  Pertinent labs & imaging results that were available during my care of the patient were reviewed by me and considered in my medical decision making (see chart for details).  The patient arrives hemodynamically stable.  She is given 324 mg of aspirin. She has typical chest pain with concerning new EKG findings so she has at least unstable angina and likely a non-ST elevation myocardial infarction. I will begin a heparin drip and I discussed the case with the hospitalist will kindly admit her to her service.   ____________________________________________   FINAL CLINICAL IMPRESSION(S) / ED DIAGNOSES  Final diagnoses:  Unstable angina ([place])       NEW MEDICATIONS STARTED DURING THIS VISIT:  Current Discharge Medication List       Note:  This document was prepared using Dragon voice recognition software and may include unintentional dictation errors.     Darel Hong, MD 08/17/17 2340

## 2017-08-18 ENCOUNTER — Inpatient Hospital Stay: Admit: 2017-08-18 | Payer: Medicare Other

## 2017-08-18 LAB — LIPID PANEL
Cholesterol: 122 mg/dL (ref 0–200)
HDL: 35 mg/dL — ABNORMAL LOW (ref >40)
LDL Cholesterol: 33 mg/dL (ref 0–99)
TRIGLYCERIDES: 271 mg/dL — AB (ref <150)
Total CHOL/HDL Ratio: 3.5 RATIO
VLDL: 54 mg/dL — AB (ref 0–40)

## 2017-08-18 LAB — TROPONIN I: Troponin I: <0.03 ng/mL (ref <0.03)

## 2017-08-18 LAB — CBC
HEMATOCRIT: 35.2 % (ref 35.0–47.0)
HEMOGLOBIN: 12.4 g/dL (ref 12.0–16.0)
MCH: 31.1 pg (ref 26.0–34.0)
MCHC: 35.3 g/dL (ref 32.0–36.0)
MCV: 88.1 fL (ref 80.0–100.0)
Platelets: 222 10*3/uL (ref 150–440)
RBC: 3.99 MIL/uL (ref 3.80–5.20)
RDW: 13.4 % (ref 11.5–14.5)
WBC: 6 10*3/uL (ref 3.6–11.0)

## 2017-08-18 LAB — BASIC METABOLIC PANEL
Anion gap: 9 (ref 5–15)
BUN: 9 mg/dL (ref 6–20)
CALCIUM: 8.6 mg/dL — AB (ref 8.9–10.3)
CHLORIDE: 109 mmol/L (ref 101–111)
CO2: 23 mmol/L (ref 22–32)
CREATININE: 0.5 mg/dL (ref 0.44–1.00)
Glucose, Bld: 134 mg/dL — ABNORMAL HIGH (ref 65–99)
Potassium: 3.2 mmol/L — ABNORMAL LOW (ref 3.5–5.1)
SODIUM: 141 mmol/L (ref 135–145)

## 2017-08-18 LAB — PROTIME-INR
INR: 1.08
PROTHROMBIN TIME: 13.9 s (ref 11.4–15.2)

## 2017-08-18 LAB — HEPARIN LEVEL (UNFRACTIONATED)
HEPARIN UNFRACTIONATED: 0.4 [IU]/mL (ref 0.30–0.70)
Heparin Unfractionated: 0.29 IU/mL — ABNORMAL LOW (ref 0.30–0.70)

## 2017-08-18 LAB — HEMOGLOBIN A1C
HEMOGLOBIN A1C: 6.2 % — AB (ref 4.8–5.6)
MEAN PLASMA GLUCOSE: 131.24 mg/dL

## 2017-08-18 LAB — GLUCOSE, CAPILLARY
GLUCOSE-CAPILLARY: 138 mg/dL — AB (ref 65–99)
Glucose-Capillary: 112 mg/dL — ABNORMAL HIGH (ref 65–99)

## 2017-08-18 MED ORDER — HEPARIN BOLUS VIA INFUSION
1200.0000 [IU] | Freq: Once | INTRAVENOUS | Status: AC
  Administered 2017-08-18: 1200 [IU] via INTRAVENOUS
  Filled 2017-08-18: qty 1200

## 2017-08-18 NOTE — Consult Note (Signed)
Reason for Consult:chest pain possible angina Referring Physician: Margurite Auerbach NP primary, [facility] Cardiologist Dr. Oretha Milch Hannah Clarke is an 51 y.o. female.  HPI: patient has history of known cries easal infarction PCI stentthe past as arD hypertension hyperlipidemia shelopmentally delayed reporlast week atDr. Theda Sers office. The results are not readily available but the patient think that she may have shown that there may be a blockage and she is to follow-up. Patient complains of recurrent chest pain symptoubsternal secoergency room and was admitted for further evaluation  Past Medical History:  Diagnosis Date  . Anxiety   . Chronic headaches   . Diabetes mellitus without complication ([place])   . GERD (gastroesophageal reflux disease)   . High cholesterol   . Hypertension   . Mental developmental delay   . Panic attacks     Past Surgical History:  Procedure Laterality Date  . HAND SURGERY    . KIDNEY STONE SURGERY      Family History  Problem Relation Age of Onset  . Breast cancer Mother 81       pre pt.     Social History:  reports that she has never smoked. She has never used smokeless tobacco. She reports that she does not drink alcohol or use drugs.  Allergies: No Known Allergies  Medications: I have reviewed the patient's current medications.  Results for orders placed or performed during the hospital encounter of 08/17/17 (from the past 48 hour(s))  Basic metabolic panel     Status: Abnormal   Collection Time: 08/17/17  9:01 PM  Result Value Ref Range   Sodium 142 135 - 145 mmol/L   Potassium 3.9 3.5 - 5.1 mmol/L   Chloride 108 101 - 111 mmol/L   CO2 24 22 - 32 mmol/L   Glucose, Bld 153 (H) 65 - 99 mg/dL   BUN 11 6 - 20 mg/dL   Creatinine, Ser 0.50 0.44 - 1.00 mg/dL   Calcium 9.5 8.9 - 10.3 mg/dL   GFR calc non Af Amer >60 >60 mL/min   GFR calc Af Amer >60 >60 mL/min    Comment: (NOTE) The eGFR has been calculated using the CKD EPI  equation. This calculation has not been validated in all clinical situations. eGFR's persistently <60 mL/min signify possible Chronic Kidney Disease.    Anion gap 10 5 - 15  CBC     Status: None   Collection Time: 08/17/17  9:01 PM  Result Value Ref Range   WBC 6.2 3.6 - 11.0 K/uL   RBC 4.22 3.80 - 5.20 MIL/uL   Hemoglobin 12.9 12.0 - 16.0 g/dL   HCT 36.5 35.0 - 47.0 %   MCV 86.5 80.0 - 100.0 fL   MCH 30.5 26.0 - 34.0 pg   MCHC 35.2 32.0 - 36.0 g/dL   RDW 13.3 11.5 - 14.5 %   Platelets 305 150 - 440 K/uL  Troponin I     Status: None   Collection Time: 08/17/17  9:01 PM  Result Value Ref Range   Troponin I <0.03 <0.03 ng/mL  TSH     Status: None   Collection Time: 08/17/17  9:01 PM  Result Value Ref Range   TSH 2.155 0.350 - 4.500 uIU/mL    Comment: Performed by a 3rd Generation assay with a functional sensitivity of <=0.01 uIU/mL.  T4, free     Status: None   Collection Time: 08/17/17  9:01 PM  Result Value Ref Range   Free T4  0.67 0.61 - 1.12 ng/dL    Comment: (NOTE) Biotin ingestion may interfere with free T4 tests. If the results are inconsistent with the TSH level, previous test results, or the clinical presentation, then consider biotin interference. If needed, order repeat testing after stopping biotin.   Hemoglobin A1c     Status: Abnormal   Collection Time: 08/17/17  9:01 PM  Result Value Ref Range   Hgb A1c MFr Bld 6.2 (H) 4.8 - 5.6 %    Comment: (NOTE) Pre diabetes:          5.7%-6.4% Diabetes:              >6.4% Glycemic control for   <7.0% adults with diabetes    Mean Plasma Glucose 131.24 mg/dL    Comment: Performed at [facility] [address] [place],  [address]  APTT     Status: None   Collection Time: 08/17/17 10:17 PM  Result Value Ref Range   aPTT 26 24 - 36 seconds  Protime-INR     Status: None   Collection Time: 08/17/17 10:17 PM  Result Value Ref Range   Prothrombin Time 12.9 11.4 - 15.2 seconds   INR 0.98   Glucose,  capillary     Status: Abnormal   Collection Time: 08/17/17 11:33 PM  Result Value Ref Range   Glucose-Capillary 138 (H) 65 - 99 mg/dL  Troponin I     Status: None   Collection Time: 08/18/17  3:10 AM  Result Value Ref Range   Troponin I <0.03 <0.03 ng/mL  Heparin level (unfractionated)     Status: Abnormal   Collection Time: 08/18/17  4:50 AM  Result Value Ref Range   Heparin Unfractionated 0.29 (L) 0.30 - 0.70 IU/mL    Comment:        IF HEPARIN RESULTS ARE BELOW EXPECTED VALUES, AND PATIENT DOSAGE HAS BEEN CONFIRMED, SUGGEST FOLLOW UP TESTING OF ANTITHROMBIN III LEVELS.   Basic metabolic panel     Status: Abnormal   Collection Time: 08/18/17  4:50 AM  Result Value Ref Range   Sodium 141 135 - 145 mmol/L   Potassium 3.2 (L) 3.5 - 5.1 mmol/L   Chloride 109 101 - 111 mmol/L   CO2 23 22 - 32 mmol/L   Glucose, Bld 134 (H) 65 - 99 mg/dL   BUN 9 6 - 20 mg/dL   Creatinine, Ser 0.50 0.44 - 1.00 mg/dL   Calcium 8.6 (L) 8.9 - 10.3 mg/dL   GFR calc non Af Amer >60 >60 mL/min   GFR calc Af Amer >60 >60 mL/min    Comment: (NOTE) The eGFR has been calculated using the CKD EPI equation. This calculation has not been validated in all clinical situations. eGFR's persistently <60 mL/min signify possible Chronic Kidney Disease.    Anion gap 9 5 - 15  Lipid panel     Status: Abnormal   Collection Time: 08/18/17  4:50 AM  Result Value Ref Range   Cholesterol 122 0 - 200 mg/dL   Triglycerides 271 (H) <150 mg/dL   HDL 35 (L) >40 mg/dL   Total CHOL/HDL Ratio 3.5 RATIO   VLDL 54 (H) 0 - 40 mg/dL   LDL Cholesterol 33 0 - 99 mg/dL    Comment:        Total Cholesterol/HDL:CHD Risk Coronary Heart Disease Risk Table                     Men  Women  1/2 Average Risk   3.4   3.3  Average Risk       5.0   4.4  2 X Average Risk   9.6   7.1  3 X Average Risk  23.4   11.0        Use the calculated Patient Ratio above and the CHD Risk Table to determine the patient's CHD Risk.        ATP III  CLASSIFICATION (LDL):  <100     mg/dL   Optimal  100-129  mg/dL   Near or Above                    Optimal  130-159  mg/dL   Borderline  160-189  mg/dL   High  >190     mg/dL   Very High   CBC     Status: None   Collection Time: 08/18/17  4:50 AM  Result Value Ref Range   WBC 6.0 3.6 - 11.0 K/uL   RBC 3.99 3.80 - 5.20 MIL/uL   Hemoglobin 12.4 12.0 - 16.0 g/dL   HCT 35.2 35.0 - 47.0 %   MCV 88.1 80.0 - 100.0 fL   MCH 31.1 26.0 - 34.0 pg   MCHC 35.3 32.0 - 36.0 g/dL   RDW 13.4 11.5 - 14.5 %   Platelets 222 150 - 440 K/uL  Protime-INR     Status: None   Collection Time: 08/18/17  4:50 AM  Result Value Ref Range   Prothrombin Time 13.9 11.4 - 15.2 seconds   INR 1.08   Glucose, capillary     Status: Abnormal   Collection Time: 08/18/17  7:59 AM  Result Value Ref Range   Glucose-Capillary 138 (H) 65 - 99 mg/dL  Troponin I     Status: None   Collection Time: 08/18/17 10:56 AM  Result Value Ref Range   Troponin I <0.03 <0.03 ng/mL  Heparin level (unfractionated)     Status: None   Collection Time: 08/18/17 10:56 AM  Result Value Ref Range   Heparin Unfractionated 0.40 0.30 - 0.70 IU/mL    Comment:        IF HEPARIN RESULTS ARE BELOW EXPECTED VALUES, AND PATIENT DOSAGE HAS BEEN CONFIRMED, SUGGEST FOLLOW UP TESTING OF ANTITHROMBIN III LEVELS.   Glucose, capillary     Status: Abnormal   Collection Time: 08/18/17 11:38 AM  Result Value Ref Range   Glucose-Capillary 112 (H) 65 - 99 mg/dL    Dg Chest 2 View  Result Date: 08/17/2017 CLINICAL DATA:  Chest pain for 2 days EXAM: CHEST  2 VIEW COMPARISON:  07/28/07 FINDINGS: The heart size and mediastinal contours are within normal limits. Both lungs are clear. Bronchial wall thickening is again noted. The visualized skeletal structures are unremarkable. IMPRESSION: No active cardiopulmonary disease. Chronic bronchial wall thickening. Electronically Signed   By: Kerby Moors M.D.   On: 08/17/2017 21:45    Review of Systems   Constitutional: Positive for diaphoresis, malaise/fatigue and weight loss.  HENT: Positive for congestion.   Eyes: Negative.   Respiratory: Positive for shortness of breath.   Cardiovascular: Positive for chest pain and palpitations.  Gastrointestinal: Positive for heartburn.  Genitourinary: Negative.   Musculoskeletal: Positive for myalgias.  Skin: Negative.   Neurological: Positive for dizziness and weakness.  Endo/Heme/Allergies: Negative.   Psychiatric/Behavioral: Negative.    Blood pressure 123/90, pulse 69, temperature 98.6 F (37 C), temperature source Oral, resp. rate 14, height 5' 2" (  1.575 m), weight 177 lb 12.8 oz (80.6 kg), SpO2 91 %. Physical Exam  Nursing note and vitals reviewed. Constitutional: She is oriented to person, place, and time. She appears well-developed and well-nourished.  HENT:  Head: Normocephalic and atraumatic.  Eyes: Pupils are equal, round, and reactive to light. Conjunctivae and EOM are normal.  Neck: Normal range of motion. Neck supple.  Cardiovascular: Normal rate, regular rhythm and normal heart sounds.   Respiratory: Effort normal and breath sounds normal.  GI: Soft. Bowel sounds are normal.  Musculoskeletal: Normal range of motion.  Neurological: She is alert and oriented to person, place, and time. She has normal reflexes.  Skin: Skin is warm and dry.  Psychiatric: She has a normal mood and affect.    Assessment/Plan: Unstable angina Chest pain Anxiety GERD Hyperlipidemia Diabetes Hypertension Panic attack Mental developmental delay . Plan Agree with admitted left myocardial infarction  continue telemetry follow-up EKGs and troponins Continue hypertension control Agree with diabetes management Maintain statin for Hyperlipidemia Recommend conservative medical therapy Follow-up with cardiology next week   Dwayne D Callwood 08/18/2017, 7:02 PM

## 2017-08-18 NOTE — Clinical Social Work Note (Signed)
CSW confirmed with the patient that she is connected to [facility] through supportive living in her apartment. As the level of care is not facility-based or a group home, the patient does not need an FL2. The CSW discussed transportation with the patient who reported that she does not have funds for a taxi and no family or supportive staff available today to transport her. The CSW has provided a taxi voucher to the attending RN to ensure the patient's safe transport home. CSW is signing off.  Santiago Bumpers, MSW, Latanya Presser [phone]

## 2017-08-18 NOTE — Clinical Social Work Note (Signed)
CSW saw in chart review that patient is from a group home. CSW has confirmed such with the patient. CSW will enter a full assessment when able.  Santiago Bumpers, MSW, Latanya Presser [phone]

## 2017-08-18 NOTE — Discharge Summary (Signed)
[facility] [place] NAME: Hannah Clarke    MR#:  [id number]  DATE OF BIRTH:  August 05, 1966  DATE OF ADMISSION:  08/17/2017 ADMITTING PHYSICIAN: Harvie Bridge, DO  DATE OF DISCHARGE: 08/18/2017  PRIMARY CARE PHYSICIAN: Danelle Berry, NP    ADMISSION DIAGNOSIS:  Unstable angina ([place]) [I20.0]  DISCHARGE DIAGNOSIS:  Active Problems:   Unstable angina ([place])   SECONDARY DIAGNOSIS:   Past Medical History:  Diagnosis Date  . Anxiety   . Chronic headaches   . Diabetes mellitus without complication ([place])   . GERD (gastroesophageal reflux disease)   . High cholesterol   . Hypertension   . Mental developmental delay   . Panic attacks     HOSPITAL COURSE:   51 year old female with past medical history of panic attacks, anxiety, developmental delay, chronic headaches, GERD, Hyperlipidemia, who presented to the hospital due to chest pain.  1. Chest pain-patient presented to the hospital due to worsening chest pain worrisome for unstable angina. She was observed on telemetry, she had 3 sets of cardiac markers checked which were negative. She has recently seen a cardiologist in outpatient and had a nuclear medicine stress test which was slightly abnormal but underwent CTA coronaries which was (-).  -As per cardiology and her chest pain is not cardiac and likely related to GERD/musculoskeletal in nature. She is therefore being discharged to her Group home with follow-up with her primary care physician.  2. Essential hypertension-patient will continue her Norvasc, propranolol, Losartan.  3. Depression/anxiety-patient will continue her Wellbutrin, BuSpar, linzess  4. Hyperlipidemia - pt. Will cont. Simvastatin  5. GERD - she will cont. Her Dexilant.   6. DM - pt. Will cont. Her Metformin.   7. Neuropathy - pt. Will cont. Her Neurontin.    DISCHARGE CONDITIONS:   Stable.   CONSULTS OBTAINED:  Treatment Team:  Minus Breeding,  MD  DRUG ALLERGIES:  No Known Allergies  DISCHARGE MEDICATIONS:   Allergies as of 08/18/2017   No Known Allergies     Medication List    STOP taking these medications   sucralfate 1 g tablet Commonly known as:  CARAFATE     TAKE these medications   acetaminophen 325 MG tablet Commonly known as:  TYLENOL Take 325 mg by mouth every 6 (six) hours as needed.   albuterol 108 (90 Base) MCG/ACT inhaler Commonly known as:  PROVENTIL HFA;VENTOLIN HFA Inhale 2 puffs into the lungs every 6 (six) hours as needed for wheezing or shortness of breath.   amLODipine 5 MG tablet Commonly known as:  NORVASC Take 5 mg by mouth daily.   benzonatate 100 MG capsule Commonly known as:  TESSALON Take 100 mg by mouth 3 (three) times daily as needed for cough.   brompheniramine-pseudoephedrine-DM 30-2-10 MG/5ML syrup Take 10 mLs by mouth 4 (four) times daily as needed.   buPROPion 100 MG 12 hr tablet Commonly known as:  WELLBUTRIN SR Take 100 mg by mouth 2 (two) times daily.   busPIRone 30 MG tablet Commonly known as:  BUSPAR Take 30 mg by mouth 2 (two) times daily.   cetirizine 10 MG tablet Commonly known as:  ZYRTEC Take 10 mg by mouth daily.   cholecalciferol 1000 units tablet Commonly known as:  VITAMIN D Take 1,000 Units by mouth daily.   citalopram 20 MG tablet Commonly known as:  CELEXA Take 20 mg by mouth daily.   clopidogrel 75 MG tablet Commonly known as:  PLAVIX Take  75 mg by mouth daily.   DEXILANT 60 MG capsule Generic drug:  dexlansoprazole Take 60 mg by mouth daily.   fluticasone 50 MCG/ACT nasal spray Commonly known as:  FLONASE Place 1 spray into both nostrils daily.   Fluticasone-Salmeterol 250-50 MCG/DOSE Aepb Commonly known as:  ADVAIR Inhale 1 puff into the lungs 2 (two) times daily.   gabapentin 400 MG capsule Commonly known as:  NEURONTIN Take 400 mg by mouth 3 (three) times daily.   ibuprofen 600 MG tablet Commonly known as:   ADVIL,MOTRIN Take 1 tablet (600 mg total) by mouth every 6 (six) hours as needed.   LINZESS 145 MCG Caps capsule Generic drug:  linaclotide Take 145 mcg by mouth daily before breakfast.   LORazepam 1 MG tablet Commonly known as:  ATIVAN Take 1 tablet (1 mg total) by mouth every 8 (eight) hours as needed for anxiety.   losartan 100 MG tablet Commonly known as:  COZAAR Take 100 mg by mouth daily.   medroxyPROGESTERone 150 MG/ML injection Commonly known as:  DEPO-PROVERA Inject 150 mg into the muscle every 3 (three) months.   metFORMIN 500 MG tablet Commonly known as:  GLUCOPHAGE Take 500 mg by mouth 2 (two) times daily with a meal.   ondansetron 8 MG tablet Commonly known as:  ZOFRAN Take 8 mg by mouth every 8 (eight) hours as needed for nausea or vomiting.   polyethylene glycol packet Commonly known as:  MIRALAX / GLYCOLAX Take 17 g by mouth every other day.   propranolol 40 MG tablet Commonly known as:  INDERAL Take 20-40 mg by mouth 2 (two) times daily. Take 40 mg in the am and 20 mg at bedtime   sennosides-docusate sodium 8.6-50 MG tablet Commonly known as:  SENOKOT-S Take 1 tablet by mouth daily.   simvastatin 20 MG tablet Commonly known as:  ZOCOR Take 20 mg by mouth daily.            Discharge Care Instructions        Start     Ordered   08/18/17 0000  Activity as tolerated - No restrictions     08/18/17 1201   08/18/17 0000  Diet - low sodium heart healthy     08/18/17 1201   08/18/17 0000  Diet Carb Modified     08/18/17 1201        DISCHARGE INSTRUCTIONS:   DIET:  Cardiac diet and Diabetic diet  DISCHARGE CONDITION:  Stable  ACTIVITY:  Activity as tolerated  OXYGEN:  Home Oxygen: No.   Oxygen Delivery: room air  DISCHARGE LOCATION:  group home   If you experience worsening of your admission symptoms, develop shortness of breath, life threatening emergency, suicidal or homicidal thoughts you must seek medical attention  immediately by calling 911 or calling your MD immediately  if symptoms less severe.  You Must read complete instructions/literature along with all the possible adverse reactions/side effects for all the Medicines you take and that have been prescribed to you. Take any new Medicines after you have completely understood and accpet all the possible adverse reactions/side effects.   Please note  You were cared for by a hospitalist during your hospital stay. If you have any questions about your discharge medications or the care you received while you were in the hospital after you are discharged, you can call the unit and asked to speak with the hospitalist on call if the hospitalist that took care of you is not available. Once you  are discharged, your primary care physician will handle any further medical issues. Please note that NO REFILLS for any discharge medications will be authorized once you are discharged, as it is imperative that you return to your primary care physician (or establish a relationship with a primary care physician if you do not have one) for your aftercare needs so that they can reassess your need for medications and monitor your lab values.     Today   Still having some CP but likely musculoskeletal in nature as it's reproducible. CE X 3 have been (-) and had a recent cardiac work up which was (-).   VITAL SIGNS:  Blood pressure 123/90, pulse 69, temperature 98.6 F (37 C), temperature source Oral, resp. rate 14, height 5\' 2"  (1.575 m), weight 80.6 kg (177 lb 12.8 oz), SpO2 91 %.  I/O:   Intake/Output Summary (Last 24 hours) at 08/18/17 1312 Last data filed at 08/18/17 0646  Gross per 24 hour  Intake            70.46 ml  Output              500 ml  Net          -429.54 ml    PHYSICAL EXAMINATION:  GENERAL:  50 y.o.-year-old patient lying in the bed with no acute distress.  EYES: Pupils equal, round, reactive to light and accommodation. No scleral icterus.  Extraocular muscles intact.  HEENT: Head atraumatic, normocephalic. Oropharynx and nasopharynx clear.  NECK:  Supple, no jugular venous distention. No thyroid enlargement, no tenderness.  LUNGS: Normal breath sounds bilaterally, no wheezing, rales,rhonchi. No use of accessory muscles of respiration.  CARDIOVASCULAR: S1, S2 normal. No murmurs, rubs, or gallops.  ABDOMEN: Soft, non-tender, non-distended. Bowel sounds present. No organomegaly or mass.  EXTREMITIES: No pedal edema, cyanosis, or clubbing.  NEUROLOGIC: Cranial nerves II through XII are intact. No focal motor or sensory defecits b/l.  PSYCHIATRIC: The patient is alert and oriented x 3.  SKIN: No obvious rash, lesion, or ulcer.   DATA REVIEW:   CBC  Recent Labs Lab 08/18/17 0450  WBC 6.0  HGB 12.4  HCT 35.2  PLT 222    Chemistries   Recent Labs Lab 08/18/17 0450  NA 141  K 3.2*  CL 109  CO2 23  GLUCOSE 134*  BUN 9  CREATININE 0.50  CALCIUM 8.6*    Cardiac Enzymes  Recent Labs Lab 08/18/17 1056  TROPONINI <0.03      RADIOLOGY:  Dg Chest 2 View  Result Date: 08/17/2017 CLINICAL DATA:  Chest pain for 2 days EXAM: CHEST  2 VIEW COMPARISON:  07/28/07 FINDINGS: The heart size and mediastinal contours are within normal limits. Both lungs are clear. Bronchial wall thickening is again noted. The visualized skeletal structures are unremarkable. IMPRESSION: No active cardiopulmonary disease. Chronic bronchial wall thickening. Electronically Signed   By: Kerby Moors M.D.   On: 08/17/2017 21:45      Management plans discussed with the patient, family and they are in agreement.  CODE STATUS:     Code Status Orders        Start     Ordered   08/17/17 2310  Full code  Continuous     08/17/17 2309    Code Status History    Date Active Date Inactive Code Status Order ID Comments User Context   This patient has a current code status but no historical code status.      TOTAL  TIME TAKING CARE OF THIS  PATIENT: 40 minutes.    Henreitta Leber M.D on 08/18/2017 at 1:12 PM  Between 7am to 6pm - Pager - [phone]  After 6pm go to [email or web address] - [employer] [facility]  Office  [phone]  CC: Primary care physician; Danelle Berry, NP

## 2017-08-18 NOTE — NC FL2 (Deleted)
[place] LEVEL OF CARE SCREENING TOOL     IDENTIFICATION  Patient Name: Hannah Clarke Birthdate: [birth year] Sex: female Admission Date (Current Location): 08/17/2017  [facility] and [place] Number:  Engineering geologist and Address:  [facility], [address] [place], [place] [address]      Provider Number: [id number]  Attending Physician Name and Address:  Henreitta Leber, MD  Relative Name and Phone Number:       Current Level of Care: Hospital Recommended Level of Care: [facility] Prior Approval Number:    Date Approved/Denied:   PASRR Number:    Discharge Plan: Domiciliary (Rest home)    Current Diagnoses: Patient Active Problem List   Diagnosis Date Noted  . Unstable angina ([place]) 08/17/2017    Orientation RESPIRATION BLADDER Height & Weight     Self, Time, Situation, Place  Normal Continent Weight: 177 lb 12.8 oz (80.6 kg) Height:  5\' 2"  (157.5 cm)  BEHAVIORAL SYMPTOMS/MOOD NEUROLOGICAL BOWEL NUTRITION STATUS      Continent    AMBULATORY STATUS COMMUNICATION OF NEEDS Skin   Independent Verbally Normal                       Personal Care Assistance Level of Assistance  Bathing, Feeding, Dressing Bathing Assistance: Independent Feeding assistance: Independent Dressing Assistance: Independent     Functional Limitations Info             SPECIAL CARE FACTORS FREQUENCY                       Contractures Contractures Info: Not present    Additional Factors Info  Code Status, Allergies Code Status Info: Full Allergies Info: NKA           Current Medications (08/18/2017):  This is the current hospital active medication list Current Facility-Administered Medications  Medication Dose Route Frequency Provider Last Rate Last Dose  . 0.9 %  sodium chloride infusion  250 mL Intravenous PRN Hugelmeyer, Alexis, DO      . acetaminophen (TYLENOL) tablet 650 mg  650 mg Oral Q4H PRN Hugelmeyer,  Alexis, DO      . amLODipine (NORVASC) tablet 5 mg  5 mg Oral Daily Hugelmeyer, Alexis, DO   5 mg at 08/18/17 1133  . aspirin EC tablet 81 mg  81 mg Oral Daily Hugelmeyer, Alexis, DO   81 mg at 08/18/17 1131  . benzonatate (TESSALON) capsule 100 mg  100 mg Oral TID PRN Hugelmeyer, Alexis, DO      . buPROPion (WELLBUTRIN SR) 12 hr tablet 100 mg  100 mg Oral BID Hugelmeyer, Alexis, DO   100 mg at 08/18/17 1132  . busPIRone (BUSPAR) tablet 30 mg  30 mg Oral BID Hugelmeyer, Alexis, DO   30 mg at 08/18/17 1131  . cholecalciferol (VITAMIN D) tablet 1,000 Units  1,000 Units Oral Daily Hugelmeyer, Alexis, DO   1,000 Units at 08/18/17 1133  . citalopram (CELEXA) tablet 20 mg  20 mg Oral Daily Hugelmeyer, Alexis, DO   20 mg at 08/18/17 1132  . clopidogrel (PLAVIX) tablet 75 mg  75 mg Oral Daily Hugelmeyer, Alexis, DO   75 mg at 08/18/17 1133  . fluticasone (FLONASE) 50 MCG/ACT nasal spray 1 spray  1 spray Each Nare Daily Hugelmeyer, Alexis, DO   1 spray at 08/18/17 0900  . gabapentin (NEURONTIN) capsule 400 mg  400 mg Oral TID Hugelmeyer, Alexis, DO  400 mg at 08/18/17 1134  . insulin aspart (novoLOG) injection 0-15 Units  0-15 Units Subcutaneous TID WC Hugelmeyer, Alexis, DO   2 Units at 08/18/17 0900  . insulin aspart (novoLOG) injection 0-5 Units  0-5 Units Subcutaneous QHS Hugelmeyer, Alexis, DO      . ipratropium-albuterol (DUONEB) 0.5-2.5 (3) MG/3ML nebulizer solution 3 mL  3 mL Nebulization Q6H PRN Hugelmeyer, Alexis, DO      . linaclotide (LINZESS) capsule 145 mcg  145 mcg Oral QAC breakfast Hugelmeyer, Alexis, DO   145 mcg at 08/18/17 1133  . loratadine (CLARITIN) tablet 10 mg  10 mg Oral Daily Hugelmeyer, Alexis, DO   10 mg at 08/18/17 1131  . LORazepam (ATIVAN) tablet 1 mg  1 mg Oral Q8H PRN Hugelmeyer, Alexis, DO   1 mg at 08/18/17 0035  . losartan (COZAAR) tablet 100 mg  100 mg Oral Daily Hugelmeyer, Alexis, DO   100 mg at 08/18/17 1134  . mometasone-formoterol (DULERA) 200-5 MCG/ACT inhaler 2  puff  2 puff Inhalation BID Hugelmeyer, Alexis, DO   2 puff at 08/18/17 0901  . morphine 2 MG/ML injection 1 mg  1 mg Intravenous Q4H PRN Hugelmeyer, Alexis, DO   1 mg at 08/18/17 0736  . nitroGLYCERIN (NITROSTAT) SL tablet 0.4 mg  0.4 mg Sublingual Q5 Min x 3 PRN Hugelmeyer, Alexis, DO      . ondansetron (ZOFRAN) injection 4 mg  4 mg Intravenous Q6H PRN Hugelmeyer, Alexis, DO      . pantoprazole (PROTONIX) EC tablet 40 mg  40 mg Oral Daily Hugelmeyer, Alexis, DO   40 mg at 08/18/17 1133  . polyethylene glycol (MIRALAX / GLYCOLAX) packet 17 g  17 g Oral QODAY Hugelmeyer, Alexis, DO   17 g at 08/18/17 1134  . propranolol (INDERAL) tablet 40 mg  40 mg Oral q morning - 10a Hugelmeyer, Alexis, DO   40 mg at 08/18/17 1132   And  . propranolol (INDERAL) tablet 20 mg  20 mg Oral QPM Hugelmeyer, Alexis, DO      . senna-docusate (Senokot-S) tablet 1 tablet  1 tablet Oral Daily Hugelmeyer, Alexis, DO   1 tablet at 08/18/17 1132  . simvastatin (ZOCOR) tablet 20 mg  20 mg Oral Daily Hugelmeyer, Alexis, DO      . sodium chloride flush (NS) 0.9 % injection 3 mL  3 mL Intravenous Q12H Hugelmeyer, Alexis, DO   3 mL at 08/18/17 1000  . sodium chloride flush (NS) 0.9 % injection 3 mL  3 mL Intravenous PRN Hugelmeyer, Alexis, DO         Discharge Medications: Please see discharge summary for a list of discharge medications.  Relevant Imaging Results:  Relevant Lab Results:   Additional Information SS [id number]  Zettie Pho, LCSW

## 2017-08-18 NOTE — Progress Notes (Signed)
ANTICOAGULATION CONSULT NOTE - Initial Consult  Pharmacy Consult for heparin Indication: chest pain/ACS  No Known Allergies  Patient Measurements: Height: 5\' 2"  (157.5 cm) Weight: 177 lb 12.8 oz (80.6 kg) IBW/kg (Calculated) : 50.1 Heparin Dosing Weight: 79.8 kg  Vital Signs: Temp: 98.6 F (37 C) (09/15 0830) Temp Source: Oral (09/15 0830) BP: 123/90 (09/15 0830) Pulse Rate: 69 (09/15 0830)  Labs:  Recent Labs  08/17/17 2101 08/17/17 2217 08/18/17 0310 08/18/17 0450 08/18/17 1056  HGB 12.9  --   --  12.4  --   HCT 36.5  --   --  35.2  --   PLT 305  --   --  222  --   APTT  --  26  --   --   --   LABPROT  --  12.9  --  13.9  --   INR  --  0.98  --  1.08  --   HEPARINUNFRC  --   --   --  0.29* 0.40  CREATININE 0.50  --   --  0.50  --   TROPONINI <0.03  --  <0.03  --  <0.03    Estimated Creatinine Clearance: 83.7 mL/min (by C-G formula based on SCr of 0.5 mg/dL).   Medical History: Past Medical History:  Diagnosis Date  . Anxiety   . Chronic headaches   . Diabetes mellitus without complication ([place])   . GERD (gastroesophageal reflux disease)   . High cholesterol   . Hypertension   . Mental developmental delay   . Panic attacks     Medications:  Scheduled:  . amLODipine  5 mg Oral Daily  . aspirin EC  81 mg Oral Daily  . buPROPion  100 mg Oral BID  . busPIRone  30 mg Oral BID  . cholecalciferol  1,000 Units Oral Daily  . citalopram  20 mg Oral Daily  . clopidogrel  75 mg Oral Daily  . fluticasone  1 spray Each Nare Daily  . gabapentin  400 mg Oral TID  . insulin aspart  0-15 Units Subcutaneous TID WC  . insulin aspart  0-5 Units Subcutaneous QHS  . linaclotide  145 mcg Oral QAC breakfast  . loratadine  10 mg Oral Daily  . losartan  100 mg Oral Daily  . mometasone-formoterol  2 puff Inhalation BID  . pantoprazole  40 mg Oral Daily  . polyethylene glycol  17 g Oral QODAY  . propranolol  40 mg Oral q morning - 10a   And  . propranolol  20 mg Oral  QPM  . senna-docusate  1 tablet Oral Daily  . simvastatin  20 mg Oral Daily  . sodium chloride flush  3 mL Intravenous Q12H    Assessment: Patient admitted for c/o CP, trops currently NG, but EKG has some changes. Patient is being started on heparin drip for ACS  Goal of Therapy:  Heparin level 0.3-0.7 units/ml Monitor platelets by anticoagulation protocol: Yes   Plan:  Give 4000 units bolus x 1  Will start heparin infusion @ 950 units/hr and will check HL @ 0500. Baseline labs ordered. Will monitor CBC and HLs and adjust as needed.  9/15 @ 0450 HL 0.29 subtherapeutic. Will rebolus w/ heparin 1200 units IV x 1 and will increase rate to 1100 units/hr and will recheck HL @ 1100. CBC stable. 9/15: Heparin level resulted @ 0.4. Will continue current heparin gtt rate. Will recheck Heparin level 17:00.   Larene Beach, PharmD  Clinical Pharmacist 08/18/2017

## 2017-08-18 NOTE — Progress Notes (Signed)
ANTICOAGULATION CONSULT NOTE - Initial Consult  Pharmacy Consult for heparin Indication: chest pain/ACS  No Known Allergies  Patient Measurements: Height: 5\' 2"  (157.5 cm) Weight: 176 lb (79.8 kg) IBW/kg (Calculated) : 50.1 Heparin Dosing Weight: 79.8 kg  Vital Signs: Temp: 98 F (36.7 C) (09/15 0435) Temp Source: Oral (09/14 2309) BP: 115/76 (09/15 0435) Pulse Rate: 73 (09/15 0435)  Labs:  Recent Labs  08/17/17 2101 08/17/17 2217 08/18/17 0310 08/18/17 0450  HGB 12.9  --   --  12.4  HCT 36.5  --   --  35.2  PLT 305  --   --  222  APTT  --  26  --   --   LABPROT  --  12.9  --  13.9  INR  --  0.98  --  1.08  HEPARINUNFRC  --   --   --  0.29*  CREATININE 0.50  --   --  0.50  TROPONINI <0.03  --  <0.03  --     Estimated Creatinine Clearance: 83.3 mL/min (by C-G formula based on SCr of 0.5 mg/dL).   Medical History: Past Medical History:  Diagnosis Date  . Anxiety   . Chronic headaches   . Diabetes mellitus without complication ([place])   . GERD (gastroesophageal reflux disease)   . High cholesterol   . Hypertension   . Mental developmental delay   . Panic attacks     Medications:  Scheduled:  . amLODipine  5 mg Oral Daily  . aspirin EC  81 mg Oral Daily  . buPROPion  100 mg Oral BID  . busPIRone  30 mg Oral BID  . cholecalciferol  1,000 Units Oral Daily  . citalopram  20 mg Oral Daily  . clopidogrel  75 mg Oral Daily  . fluticasone  1 spray Each Nare Daily  . gabapentin  400 mg Oral TID  . heparin  1,200 Units Intravenous Once  . insulin aspart  0-15 Units Subcutaneous TID WC  . insulin aspart  0-5 Units Subcutaneous QHS  . linaclotide  145 mcg Oral QAC breakfast  . loratadine  10 mg Oral Daily  . losartan  100 mg Oral Daily  . mometasone-formoterol  2 puff Inhalation BID  . pantoprazole  40 mg Oral Daily  . polyethylene glycol  17 g Oral QODAY  . propranolol  40 mg Oral q morning - 10a   And  . propranolol  20 mg Oral QPM  . senna-docusate  1  tablet Oral Daily  . simvastatin  20 mg Oral Daily  . sodium chloride flush  3 mL Intravenous Q12H    Assessment: Patient admitted for c/o CP, trops currently NG, but EKG has some changes. Patient is being started on heparin drip for ACS  Goal of Therapy:  Heparin level 0.3-0.7 units/ml Monitor platelets by anticoagulation protocol: Yes   Plan:  Give 4000 units bolus x 1  Will start heparin infusion @ 950 units/hr and will check HL @ 0500. Baseline labs ordered. Will monitor CBC and HLs and adjust as needed.  9/15 @ 0450 HL 0.29 subtherapeutic. Will rebolus w/ heparin 1200 units IV x 1 and will increase rate to 1100 units/hr and will recheck HL @ 1100. CBC stable.  Tobie Lords, PharmD, BCPS Clinical Pharmacist 08/18/2017

## 2017-08-18 NOTE — Progress Notes (Signed)
Discharged with her mother to group home.  AVS is in her bag with instructions to give it to the staff.  Advised her to take tylenol for any additional pain.

## 2017-08-18 NOTE — Progress Notes (Signed)
A&O. Up with assist. No complaints. No apparent distress. Heparin drip increased to 11 ml/hr. Will continue to monitor.

## 2017-08-18 NOTE — Plan of Care (Signed)
Problem: Health Behavior/Discharge Planning: Goal: Ability to manage health-related needs will improve Outcome: Adequate for Discharge Patient has assistance with her meds and schedule

## 2017-08-19 LAB — HIV ANTIBODY (ROUTINE TESTING W REFLEX): HIV SCREEN 4TH GENERATION: NONREACTIVE

## 2017-08-30 ENCOUNTER — Other Ambulatory Visit: Payer: Self-pay | Admitting: Nurse Practitioner

## 2017-08-30 DIAGNOSIS — M79605 Pain in left leg: Secondary | ICD-10-CM

## 2017-09-04 ENCOUNTER — Other Ambulatory Visit: Payer: Self-pay | Admitting: Nurse Practitioner

## 2017-09-04 DIAGNOSIS — M79605 Pain in left leg: Secondary | ICD-10-CM

## 2017-09-04 DIAGNOSIS — R252 Cramp and spasm: Secondary | ICD-10-CM

## 2017-09-05 ENCOUNTER — Ambulatory Visit: Admission: RE | Admit: 2017-09-05 | Payer: Medicare Other | Source: Ambulatory Visit

## 2017-09-05 ENCOUNTER — Ambulatory Visit
Admission: RE | Admit: 2017-09-05 | Discharge: 2017-09-05 | Disposition: A | Discharge status: Home / Self Care | Payer: Medicare Other | Source: Ambulatory Visit | Attending: Nurse Practitioner | Admitting: Nurse Practitioner

## 2017-09-05 DIAGNOSIS — M7122 Synovial cyst of popliteal space [Baker], left knee: Secondary | ICD-10-CM | POA: Insufficient documentation

## 2017-09-05 DIAGNOSIS — M79662 Pain in left lower leg: Secondary | ICD-10-CM | POA: Insufficient documentation

## 2017-09-05 DIAGNOSIS — M79605 Pain in left leg: Secondary | ICD-10-CM

## 2017-09-05 DIAGNOSIS — R252 Cramp and spasm: Secondary | ICD-10-CM

## 2017-09-18 ENCOUNTER — Emergency Department: Payer: Medicare Other

## 2017-09-18 ENCOUNTER — Encounter: Payer: Self-pay | Admitting: Emergency Medicine

## 2017-09-18 ENCOUNTER — Emergency Department
Admission: EM | Admit: 2017-09-18 | Discharge: 2017-09-18 | Disposition: A | Discharge status: Home / Self Care | Payer: Medicare Other | Attending: Student in an Organized Health Care Education/Training Program | Admitting: Student in an Organized Health Care Education/Training Program

## 2017-09-18 DIAGNOSIS — Z79899 Other long term (current) drug therapy: Secondary | ICD-10-CM | POA: Insufficient documentation

## 2017-09-18 DIAGNOSIS — S060X1A Concussion with loss of consciousness of 30 minutes or less, initial encounter: Secondary | ICD-10-CM | POA: Diagnosis not present

## 2017-09-18 DIAGNOSIS — M25522 Pain in left elbow: Secondary | ICD-10-CM | POA: Insufficient documentation

## 2017-09-18 DIAGNOSIS — Y939 Activity, unspecified: Secondary | ICD-10-CM | POA: Insufficient documentation

## 2017-09-18 DIAGNOSIS — F819 Developmental disorder of scholastic skills, unspecified: Secondary | ICD-10-CM | POA: Diagnosis not present

## 2017-09-18 DIAGNOSIS — Y999 Unspecified external cause status: Secondary | ICD-10-CM | POA: Insufficient documentation

## 2017-09-18 DIAGNOSIS — S0990XA Unspecified injury of head, initial encounter: Secondary | ICD-10-CM

## 2017-09-18 DIAGNOSIS — I10 Essential (primary) hypertension: Secondary | ICD-10-CM | POA: Diagnosis not present

## 2017-09-18 DIAGNOSIS — Z7984 Long term (current) use of oral hypoglycemic drugs: Secondary | ICD-10-CM | POA: Diagnosis not present

## 2017-09-18 DIAGNOSIS — Y9289 Other specified places as the place of occurrence of the external cause: Secondary | ICD-10-CM | POA: Diagnosis not present

## 2017-09-18 DIAGNOSIS — Z7901 Long term (current) use of anticoagulants: Secondary | ICD-10-CM | POA: Diagnosis not present

## 2017-09-18 DIAGNOSIS — E119 Type 2 diabetes mellitus without complications: Secondary | ICD-10-CM | POA: Diagnosis not present

## 2017-09-18 DIAGNOSIS — W19XXXA Unspecified fall, initial encounter: Secondary | ICD-10-CM | POA: Insufficient documentation

## 2017-09-18 LAB — URINALYSIS, COMPLETE (UACMP) WITH MICROSCOPIC
BACTERIA UA: NONE SEEN
BILIRUBIN URINE: NEGATIVE
Glucose, UA: NEGATIVE mg/dL
KETONES UR: NEGATIVE mg/dL
LEUKOCYTES UA: NEGATIVE
Nitrite: NEGATIVE
PROTEIN: 30 mg/dL — AB
Specific Gravity, Urine: 1.025 (ref 1.005–1.030)
pH: 5 (ref 5.0–8.0)

## 2017-09-18 LAB — BASIC METABOLIC PANEL
ANION GAP: 10 (ref 5–15)
BUN: 13 mg/dL (ref 6–20)
CO2: 22 mmol/L (ref 22–32)
Calcium: 9.1 mg/dL (ref 8.9–10.3)
Chloride: 110 mmol/L (ref 101–111)
Creatinine, Ser: 0.58 mg/dL (ref 0.44–1.00)
GFR calc Af Amer: >60 mL/min (ref >60)
GLUCOSE: 122 mg/dL — AB (ref 65–99)
POTASSIUM: 3.5 mmol/L (ref 3.5–5.1)
Sodium: 142 mmol/L (ref 135–145)

## 2017-09-18 LAB — CBC
HEMATOCRIT: 36.3 % (ref 35.0–47.0)
Hemoglobin: 12.4 g/dL (ref 12.0–16.0)
MCH: 30 pg (ref 26.0–34.0)
MCHC: 34.1 g/dL (ref 32.0–36.0)
MCV: 87.8 fL (ref 80.0–100.0)
Platelets: 288 10*3/uL (ref 150–440)
RBC: 4.13 MIL/uL (ref 3.80–5.20)
RDW: 13.6 % (ref 11.5–14.5)
WBC: 5.6 10*3/uL (ref 3.6–11.0)

## 2017-09-18 LAB — POCT PREGNANCY, URINE: PREG TEST UR: NEGATIVE

## 2017-09-18 MED ORDER — ACETAMINOPHEN 500 MG PO TABS
ORAL_TABLET | ORAL | Status: AC
Start: 2017-09-18 — End: 2017-09-18
  Administered 2017-09-18: 1000 mg via ORAL
  Filled 2017-09-18: qty 2

## 2017-09-18 MED ORDER — ACETAMINOPHEN 500 MG PO TABS
1000.0000 mg | ORAL_TABLET | Freq: Once | ORAL | Status: AC
  Administered 2017-09-18: 1000 mg via ORAL

## 2017-09-18 MED ORDER — ONDANSETRON HCL 4 MG PO TABS: 4.0000 mg | ORAL_TABLET | Freq: Every day | ORAL | 0 refills | Status: AC | PRN

## 2017-09-18 NOTE — ED Triage Notes (Addendum)
Pt reports she lives at [facility] services reports  She went to fair today reports she fell does not know if she tripped reports after she fell she felt dizzy nausea denies any other symptom Pt ambulatory to triage reports pain to left elbow, pt has slow speech due to developmental delay. Pt talks in complete sentences no respiratory distress noted

## 2017-09-18 NOTE — ED Notes (Signed)
ED Provider at bedside. 

## 2017-09-18 NOTE — ED Provider Notes (Signed)
[facility] Emergency Department Provider Note    First MD Initiated Contact with Patient 09/18/17 2218     (approximate)  I have reviewed the triage vital signs and the nursing notes.   HISTORY  Chief Complaint Arm Pain and Fall    HPI Hannah Clarke is a 51 y.o. female Presents with chief complaint of headache and left elbow pain [place]. States that she may have blacked out after falling. describes the headache as mild but persistent. Has not taken anything for the headache.she thinks that she tripped while walking. Denies any chest pain or shortness of breath. No abdominal pain. No nausea or vomiting. No numbness or tingling. She does not take any blood thinners.   Past Medical History:  Diagnosis Date  . Anxiety   . Chronic headaches   . Diabetes mellitus without complication ([place])   . GERD (gastroesophageal reflux disease)   . High cholesterol   . Hypertension   . Mental developmental delay   . Panic attacks    Family History  Problem Relation Age of Onset  . Breast cancer Mother 13       pre pt.    Past Surgical History:  Procedure Laterality Date  . HAND SURGERY    . KIDNEY STONE SURGERY     Patient Active Problem List   Diagnosis Date Noted  . Unstable angina ([place]) 08/17/2017      Prior to Admission medications   Medication Sig Start Date End Date Taking? Authorizing Provider  acetaminophen (TYLENOL) 325 MG tablet Take 325 mg by mouth every 6 (six) hours as needed.    [provider]  albuterol (PROVENTIL HFA;VENTOLIN HFA) 108 (90 BASE) MCG/ACT inhaler Inhale 2 puffs into the lungs every 6 (six) hours as needed for wheezing or shortness of breath. 11/07/15   Orbie Pyo, MD  amLODipine (NORVASC) 5 MG tablet Take 5 mg by mouth daily.    [provider]  benzonatate (TESSALON) 100 MG capsule Take 100 mg by mouth 3 (three) times daily as needed for cough.    [provider]  brompheniramine-pseudoephedrine-DM 30-2-10 MG/5ML syrup Take 10 mLs by mouth 4 (four) times daily as needed. 11/26/16   Cuthriell, Charline Bills, PA-C  buPROPion (WELLBUTRIN SR) 100 MG 12 hr tablet Take 100 mg by mouth 2 (two) times daily.    [provider]  busPIRone (BUSPAR) 30 MG tablet Take 30 mg by mouth 2 (two) times daily.    [provider]  cetirizine (ZYRTEC) 10 MG tablet Take 10 mg by mouth daily.    [provider]  cholecalciferol (VITAMIN D) 1000 units tablet Take 1,000 Units by mouth daily.    [provider]  citalopram (CELEXA) 20 MG tablet Take 20 mg by mouth daily.    [provider]  clopidogrel (PLAVIX) 75 MG tablet Take 75 mg by mouth daily.    [provider]  dexlansoprazole (DEXILANT) 60 MG capsule Take 60 mg by mouth daily.    [provider]  fluticasone (FLONASE) 50 MCG/ACT nasal spray Place 1 spray into both nostrils daily.    [provider]  Fluticasone-Salmeterol (ADVAIR) 250-50 MCG/DOSE AEPB Inhale 1 puff into the lungs 2 (two) times daily.    [provider]  gabapentin (NEURONTIN) 400 MG capsule Take 400 mg by mouth 3 (three) times daily.    [provider]  ibuprofen (ADVIL,MOTRIN) 600 MG tablet Take 1 tablet (  600 mg total) by mouth every 6 (six) hours as needed. 01/23/17   Triplett, Dessa Phi, FNP  linaclotide (LINZESS) 145 MCG CAPS capsule Take 145 mcg by mouth daily before breakfast.    [provider]  LORazepam (ATIVAN) 1 MG tablet Take 1 tablet (1 mg total) by mouth every 8 (eight) hours as needed for anxiety. 02/03/16   Paulette Blanch, MD  losartan (COZAAR) 100 MG tablet Take 100 mg by mouth daily.    [provider]  medroxyPROGESTERone (DEPO-PROVERA) 150 MG/ML injection Inject 150 mg into the muscle every 3 (three) months.    [provider]  metFORMIN (GLUCOPHAGE) 500 MG tablet Take 500 mg by mouth 2 (two) times daily with a meal.     [provider]  ondansetron (ZOFRAN) 4 MG tablet Take 1 tablet (4 mg total) by mouth daily as needed for nausea or vomiting. 09/18/17 09/18/18  [name], MD  ondansetron (ZOFRAN) 8 MG tablet Take 8 mg by mouth every 8 (eight) hours as needed for nausea or vomiting.    [provider]  polyethylene glycol (MIRALAX / GLYCOLAX) packet Take 17 g by mouth every other day.    [provider]  propranolol (INDERAL) 40 MG tablet Take 20-40 mg by mouth 2 (two) times daily. Take 40 mg in the am and 20 mg at bedtime    [provider]  sennosides-docusate sodium (SENOKOT-S) 8.6-50 MG tablet Take 1 tablet by mouth daily.    [provider]  simvastatin (ZOCOR) 20 MG tablet Take 20 mg by mouth daily.    [provider]    Allergies Patient has no known allergies.    Social History Social History  Substance Use Topics  . Smoking status: Never Smoker  . Smokeless tobacco: Never Used  . Alcohol use No    Review of Systems Patient denies headaches, rhinorrhea, blurry vision, numbness, shortness of breath, chest pain, edema, cough, abdominal pain, nausea, vomiting, diarrhea, dysuria, fevers, rashes or hallucinations unless otherwise stated above in HPI. ____________________________________________   PHYSICAL EXAM:  VITAL SIGNS: Vitals:   09/18/17 1944 09/18/17 2315  BP: 138/87 133/88  Pulse: 60 (!) 59  Resp: 20 16  Temp: 98.3 F (36.8 C)   SpO2: 96% 97%    Constitutional: Alert and oriented.  in no acute distress. Eyes: Conjunctivae are normal.  Head: Atraumatic. Nose: No congestion/rhinnorhea. Mouth/Throat: Mucous membranes are moist.   Neck: No stridor. Painless ROM.  Cardiovascular: Normal rate, regular rhythm. Grossly normal heart sounds.  Good peripheral circulation. Respiratory: Normal respiratory effort.  No retractions. Lungs CTAB. Gastrointestinal: Soft and nontender. No distention. No abdominal bruits. No CVA  tenderness. Genitourinary:  Musculoskeletal: superficial abrasion to left elbow, painless full ROM of all extremities. No lower extremity tenderness nor edema.  No joint effusions. Neurologic:  No gross focal neurologic deficits are appreciated. No facial droop Skin:  Skin is warm, dry and intact. No rash noted. Psychiatric: Mood and affect are normal. Speech and behavior are normal.  ____________________________________________   LABS (all labs ordered are listed, but only abnormal results are displayed)  Results for orders placed or performed during the hospital encounter of 09/18/17 (from the past 24 hour(s))  Pregnancy, urine POC     Status: None   Collection Time: 09/18/17  7:52 PM  Result Value Ref Range   Preg Test, Ur NEGATIVE NEGATIVE  Basic metabolic panel     Status: Abnormal   Collection Time: 09/18/17  7:54 PM  Result Value Ref Range   Sodium 142 135 - 145 mmol/L   Potassium 3.5 3.5 - 5.1 mmol/L   Chloride 110 101 - 111 mmol/L   CO2 22 22 - 32 mmol/L   Glucose, Bld 122 (H) 65 - 99 mg/dL   BUN 13 6 - 20 mg/dL   Creatinine, Ser 0.58 0.44 - 1.00 mg/dL   Calcium 9.1 8.9 - 10.3 mg/dL   GFR calc non Af Amer >60 >60 mL/min   GFR calc Af Amer >60 >60 mL/min   Anion gap 10 5 - 15  CBC     Status: None   Collection Time: 09/18/17  7:54 PM  Result Value Ref Range   WBC 5.6 3.6 - 11.0 K/uL   RBC 4.13 3.80 - 5.20 MIL/uL   Hemoglobin 12.4 12.0 - 16.0 g/dL   HCT 36.3 35.0 - 47.0 %   MCV 87.8 80.0 - 100.0 fL   MCH 30.0 26.0 - 34.0 pg   MCHC 34.1 32.0 - 36.0 g/dL   RDW 13.6 11.5 - 14.5 %   Platelets 288 150 - 440 K/uL  Urinalysis, Complete w Microscopic     Status: Abnormal   Collection Time: 09/18/17  7:54 PM  Result Value Ref Range   Color, Urine YELLOW (A) YELLOW   APPearance CLEAR (A) CLEAR   Specific Gravity, Urine 1.025 1.005 - 1.030   pH 5.0 5.0 - 8.0   Glucose, UA NEGATIVE NEGATIVE mg/dL   Hgb urine dipstick MODERATE (A) NEGATIVE   Bilirubin Urine NEGATIVE  NEGATIVE   Ketones, ur NEGATIVE NEGATIVE mg/dL   Protein, ur 30 (A) NEGATIVE mg/dL   Nitrite NEGATIVE NEGATIVE   Leukocytes, UA NEGATIVE NEGATIVE   RBC / HPF 6-30 0 - 5 RBC/hpf   WBC, UA 0-5 0 - 5 WBC/hpf   Bacteria, UA NONE SEEN NONE SEEN   Squamous Epithelial / LPF 0-5 (A) NONE SEEN   Mucus PRESENT    ____________________________________________  EKG My review and personal interpretation at Time: 19:51   Indication: fall  Rate: 60  Rhythm: sinus Axis: normal Other: normal intervals, no stemi T wave inversions unchanged from previous ____________________________________________  RADIOLOGY  I personally reviewed all radiographic images ordered to evaluate for the above acute complaints and reviewed radiology reports and findings.  These findings were personally discussed with the patient.  Please see medical record for radiology report.  ____________________________________________   PROCEDURES  Procedure(s) performed:  Procedures    Critical Care performed: no ____________________________________________   INITIAL IMPRESSION / ASSESSMENT AND PLAN / ED COURSE  Pertinent labs & imaging results that were available during my care of the patient were reviewed by me and considered in my medical decision making (see chart for details).  DDX: fracture, contusion, iph, sah, concussion, dehydration, dysrhythmia  AISLEE LANDGREN is a 51 y.o. who presents to the ED with the above complaints. Patient is well-appearing and in no acute distress. EKG shows no acute changes or abnormality's. Patient denies any chest pain or shortness of breath. Seems to be primarily mechanical fall based on her history. No evidence of traumatic injury. Blood word is REASSURING. CT head shows no evidence of intracranial abnormality. Patient was given Tylenol for pain with improvement in her symptoms. Discussed signs and symptoms of concussion. Will give patient follow up with neurology and PCP.  Have  discussed with the patient and available family all diagnostics and treatments performed thus far and all questions were answered to the best of my  ability. The patient demonstrates understanding and agreement with plan.       ____________________________________________   FINAL CLINICAL IMPRESSION(S) / ED DIAGNOSES  Final diagnoses:  Minor head injury, initial encounter  Concussion with loss of consciousness of 30 minutes or less, initial encounter  Left elbow pain      NEW MEDICATIONS STARTED DURING THIS VISIT:  Discharge Medication List as of 09/18/2017 11:07 PM    START taking these medications   Details  !! ondansetron (ZOFRAN) 4 MG tablet Take 1 tablet (4 mg total) by mouth daily as needed for nausea or vomiting., Starting Tue 09/18/2017, Until Wed 09/18/2018, Print     !! - Potential duplicate medications found. Please discuss with provider.       Note:  This document was prepared using Dragon voice recognition software and may include unintentional dictation errors.    [name], MD 09/18/17 [phone]

## 2017-10-03 ENCOUNTER — Ambulatory Visit
Admission: RE | Admit: 2017-10-03 | Discharge: 2017-10-03 | Disposition: A | Discharge status: Home / Self Care | Payer: Medicare Other | Source: Ambulatory Visit | Attending: Nurse Practitioner | Admitting: Nurse Practitioner

## 2017-10-03 DIAGNOSIS — Z1231 Encounter for screening mammogram for malignant neoplasm of breast: Secondary | ICD-10-CM | POA: Insufficient documentation

## 2017-10-04 ENCOUNTER — Other Ambulatory Visit: Payer: Self-pay | Admitting: Nurse Practitioner

## 2017-10-04 DIAGNOSIS — N6489 Other specified disorders of breast: Secondary | ICD-10-CM

## 2017-10-04 DIAGNOSIS — R928 Other abnormal and inconclusive findings on diagnostic imaging of breast: Secondary | ICD-10-CM

## 2017-10-08 ENCOUNTER — Encounter: Payer: Self-pay | Admitting: *Deleted

## 2017-10-09 ENCOUNTER — Encounter
Admission: RE | Disposition: A | Discharge status: Home / Self Care | Payer: Self-pay | Source: Ambulatory Visit | Attending: Internal Medicine

## 2017-10-09 ENCOUNTER — Ambulatory Visit
Admission: RE | Admit: 2017-10-09 | Discharge: 2017-10-09 | Disposition: A | Discharge status: Home / Self Care | Payer: Medicare Other | Source: Ambulatory Visit | Attending: Internal Medicine | Admitting: Internal Medicine

## 2017-10-09 ENCOUNTER — Ambulatory Visit: Payer: Medicare Other | Admitting: Anesthesiology

## 2017-10-09 DIAGNOSIS — K219 Gastro-esophageal reflux disease without esophagitis: Secondary | ICD-10-CM | POA: Diagnosis not present

## 2017-10-09 DIAGNOSIS — Z1211 Encounter for screening for malignant neoplasm of colon: Secondary | ICD-10-CM | POA: Insufficient documentation

## 2017-10-09 DIAGNOSIS — K5900 Constipation, unspecified: Secondary | ICD-10-CM | POA: Insufficient documentation

## 2017-10-09 DIAGNOSIS — F329 Major depressive disorder, single episode, unspecified: Secondary | ICD-10-CM | POA: Diagnosis not present

## 2017-10-09 DIAGNOSIS — K449 Diaphragmatic hernia without obstruction or gangrene: Secondary | ICD-10-CM | POA: Insufficient documentation

## 2017-10-09 DIAGNOSIS — F79 Unspecified intellectual disabilities: Secondary | ICD-10-CM | POA: Insufficient documentation

## 2017-10-09 DIAGNOSIS — Z791 Long term (current) use of non-steroidal anti-inflammatories (NSAID): Secondary | ICD-10-CM | POA: Diagnosis not present

## 2017-10-09 DIAGNOSIS — R1310 Dysphagia, unspecified: Secondary | ICD-10-CM | POA: Diagnosis not present

## 2017-10-09 DIAGNOSIS — F419 Anxiety disorder, unspecified: Secondary | ICD-10-CM | POA: Insufficient documentation

## 2017-10-09 DIAGNOSIS — I10 Essential (primary) hypertension: Secondary | ICD-10-CM | POA: Insufficient documentation

## 2017-10-09 DIAGNOSIS — E119 Type 2 diabetes mellitus without complications: Secondary | ICD-10-CM | POA: Insufficient documentation

## 2017-10-09 DIAGNOSIS — Z79899 Other long term (current) drug therapy: Secondary | ICD-10-CM | POA: Insufficient documentation

## 2017-10-09 DIAGNOSIS — E7800 Pure hypercholesterolemia, unspecified: Secondary | ICD-10-CM | POA: Insufficient documentation

## 2017-10-09 DIAGNOSIS — Z7984 Long term (current) use of oral hypoglycemic drugs: Secondary | ICD-10-CM | POA: Insufficient documentation

## 2017-10-09 DIAGNOSIS — E669 Obesity, unspecified: Secondary | ICD-10-CM | POA: Insufficient documentation

## 2017-10-09 DIAGNOSIS — K5730 Diverticulosis of large intestine without perforation or abscess without bleeding: Secondary | ICD-10-CM | POA: Diagnosis not present

## 2017-10-09 DIAGNOSIS — Z7902 Long term (current) use of antithrombotics/antiplatelets: Secondary | ICD-10-CM | POA: Insufficient documentation

## 2017-10-09 HISTORY — DX: Major depressive disorder, single episode, unspecified: F32.9

## 2017-10-09 HISTORY — DX: Tinea unguium: B35.1

## 2017-10-09 HISTORY — DX: Depression, unspecified: F32.A

## 2017-10-09 HISTORY — DX: Cellulitis of unspecified toe: L03.039

## 2017-10-09 HISTORY — PX: COLONOSCOPY WITH PROPOFOL: SHX5780

## 2017-10-09 HISTORY — PX: ESOPHAGOGASTRODUODENOSCOPY (EGD) WITH PROPOFOL: SHX5813

## 2017-10-09 LAB — POCT PREGNANCY, URINE: Preg Test, Ur: NEGATIVE

## 2017-10-09 LAB — GLUCOSE, CAPILLARY: GLUCOSE-CAPILLARY: 108 mg/dL — AB (ref 65–99)

## 2017-10-09 SURGERY — COLONOSCOPY WITH PROPOFOL
Anesthesia: General

## 2017-10-09 MED ORDER — SODIUM CHLORIDE 0.9 % IV SOLN
INTRAVENOUS | Status: DC
  Administered 2017-10-09 (×2): via INTRAVENOUS

## 2017-10-09 MED ORDER — FENTANYL CITRATE (PF) 100 MCG/2ML IJ SOLN
INTRAMUSCULAR | Status: DC | PRN
  Administered 2017-10-09 (×2): 50 ug via INTRAVENOUS

## 2017-10-09 MED ORDER — FENTANYL CITRATE (PF) 100 MCG/2ML IJ SOLN
INTRAMUSCULAR | Status: AC
  Filled 2017-10-09: qty 2

## 2017-10-09 MED ORDER — PROPOFOL 10 MG/ML IV BOLUS
INTRAVENOUS | Status: DC | PRN
  Administered 2017-10-09: 80 mg via INTRAVENOUS
  Administered 2017-10-09: 20 mg via INTRAVENOUS

## 2017-10-09 MED ORDER — LIDOCAINE HCL 2 % EX GEL
CUTANEOUS | Status: AC
  Filled 2017-10-09: qty 5

## 2017-10-09 MED ORDER — LIDOCAINE 2% (20 MG/ML) 5 ML SYRINGE
INTRAMUSCULAR | Status: DC | PRN
  Administered 2017-10-09: 30 mg via INTRAVENOUS

## 2017-10-09 MED ORDER — PROPOFOL 500 MG/50ML IV EMUL
INTRAVENOUS | Status: DC | PRN
  Administered 2017-10-09: 160 ug/kg/min via INTRAVENOUS

## 2017-10-09 NOTE — Op Note (Signed)
[facility] Patient Name: Hannah Clarke Procedure Date: 10/09/2017 2:29 PM MRN: [id number] Account #: [id number] Date of Birth: Feb 05, 1966 Admit Type: Outpatient Age: 51 Room: [facility] ENDO ROOM 1 Gender: Female Note Status: Finalized Procedure:            Colonoscopy Indications:          Screening for colorectal malignant neoplasm Providers:            Benay Pike. Agape Hardiman MD, MD Complications:        No immediate complications. Procedure:            Pre-Anesthesia Assessment:                       - ASA Grade Assessment: II - A patient with mild                        systemic disease.                       - After reviewing the risks and benefits, the patient                        was deemed in satisfactory condition to undergo the                        procedure.                       After obtaining informed consent, the colonoscope was                        passed under direct vision. Throughout the procedure,                        the patient's blood pressure, pulse, and oxygen                        saturations were monitored continuously. The                        Colonoscope was introduced through the anus and                        advanced to the the sigmoid colon. The quality of the                        bowel preparation was not adequate to identify polyps 6                        mm and larger in size and fair except the sigmoid colon                        was unsatisfactory. Findings:      The perianal and digital rectal examinations were normal. Pertinent       negatives include normal sphincter tone and no palpable rectal lesions.      A few small-mouthed diverticula were found in the sigmoid colon.      The exam was otherwise without abnormality on direct and retroflexion       views. Impression:           -  Preparation of the colon was inadequate.                       - Diverticulosis in the sigmoid colon.       - The examination was otherwise normal on direct and                        retroflexion views.                       - No specimens collected.                       - The exam was suboptimal due to patient preparation.                       - Mild diverticulosis. There was no evidence of                        diverticular bleeding. Recommendation:       - Patient has a contact number available for                        emergencies. The signs and symptoms of potential                        delayed complications were discussed with the patient.                        Return to normal activities tomorrow. Written discharge                        instructions were provided to the patient.                       - Resume previous diet.                       - Continue present medications.                       - Repeat colonoscopy in 3 months for screening purposes.                       - Return to GI office in 3 weeks.                       - The findings and recommendations were discussed with                        the patient.                       - The findings and recommendations were discussed with                        the designated responsible adult. Procedure Code(s):    --- Professional ---                       M7672, 53, Colorectal cancer screening; colonoscopy on  individual not meeting criteria for high risk Diagnosis Code(s):    --- Professional ---                       Z12.11, Encounter for screening for malignant neoplasm                        of colon                       K57.30, Diverticulosis of large intestine without                        perforation or abscess without bleeding CPT copyright 2016 American Medical Association. All rights reserved. The codes documented in this report are preliminary and upon coder review may  be revised to meet current compliance requirements. Efrain Sella MD, MD 10/09/2017 2:54:43 PM This  report has been signed electronically. Number of Addenda: 0 Note Initiated On: 10/09/2017 2:29 PM Total Procedure Duration: 0 hours 4 minutes 59 seconds       [facility]

## 2017-10-09 NOTE — Op Note (Addendum)
[facility] Patient Name: Hannah Clarke Procedure Date: 10/09/2017 2:30 PM MRN: [id number] Account #: [id number] Date of Birth: 03/23/1966 Admit Type: Outpatient Age: 51 Room: [facility] ENDO ROOM 1 Gender: Female Note Status: Finalized Procedure:            Upper GI endoscopy Indications:          Dysphagia, Esophageal reflux Providers:            Benay Pike. Duchess Armendarez MD, MD Medicines:            Propofol per Anesthesia Complications:        No immediate complications. Procedure:            Pre-Anesthesia Assessment:                       - The risks and benefits of the procedure and the                        sedation options and risks were discussed with the                        patient. All questions were answered and informed                        consent was obtained.                       - Patient identification and proposed procedure were                        verified prior to the procedure by the nurse. The                        procedure was verified in the procedure room.                       - ASA Grade Assessment: II - A patient with mild                        systemic disease.                       - After reviewing the risks and benefits, the patient                        was deemed in satisfactory condition to undergo the                        procedure.                       After obtaining informed consent, the endoscope was                        passed under direct vision. Throughout the procedure,                        the patient's blood pressure, pulse, and oxygen                        saturations were monitored continuously. The  Colonoscope was introduced through the mouth, and                        advanced to the third part of duodenum. The upper GI                        endoscopy was accomplished without difficulty. The                        patient tolerated the procedure  well. Findings:      No endoscopic abnormality was evident in the esophagus to explain the       patient's complaint of dysphagia.      A hiatal hernia was present. This was biopsied with a cold forceps for       Helicobacter pylori testing.      The exam was otherwise without abnormality.      The examined duodenum was normal. Impression:           - No endoscopic esophageal abnormality to explain                        patient's dysphagia.                       - Hiatal hernia. Biopsied.                       - The examination was otherwise normal. Recommendation:       - Await pathology results.                       - Patient has a contact number available for                        emergencies. The signs and symptoms of potential                        delayed complications were discussed with the patient.                        Return to normal activities tomorrow. Written discharge                        instructions were provided to the patient.                       - Resume previous diet.                       - Continue present medications.                       - See the other procedure note for documentation of                        additional recommendations. Procedure Code(s):    --- Professional ---                       [phone], Esophagogastroduodenoscopy, flexible, transoral;                        with biopsy, single or multiple  Diagnosis Code(s):    --- Professional ---                       R13.10, Dysphagia, unspecified                       K44.9, Diaphragmatic hernia without obstruction or                        gangrene                       K21.9, Gastro-esophageal reflux disease without                        esophagitis CPT copyright 2016 American Medical Association. All rights reserved. The codes documented in this report are preliminary and upon coder review may  be revised to meet current compliance requirements. Efrain Sella MD, MD 10/09/2017  2:42:11 PM This report has been signed electronically. Number of Addenda: 0 Note Initiated On: 10/09/2017 2:30 PM      [facility]

## 2017-10-09 NOTE — H&P (Signed)
Outpatient short stay form Pre-procedure 10/09/2017 2:26 PM Soniyah Mcglory K. Alice Reichert, M.D.  Primary Physician: Evern Bio, NP  Reason for visit:  GERD, dysphagia, colon cancer screening.  History of present illness:  Patient is a pleasant 51 y/o female with intellectual disability presenting with intermittent dysphagia, GERD. No significant abdominal pain. Is here for colon cancer screening. Has chronic constipation controlled on Linzess.     Current Facility-Administered Medications:  .  0.9 %  sodium chloride infusion, , Intravenous, Continuous, Lollie Sails, MD, Last Rate: 20 mL/hr at 10/09/17 1414  Medications Prior to Admission  Medication Sig Dispense Refill Last Dose  . amLODipine (NORVASC) 5 MG tablet Take 5 mg by mouth daily.   10/08/2017 at Unknown time  . buPROPion (WELLBUTRIN SR) 100 MG 12 hr tablet Take 100 mg by mouth 2 (two) times daily.   10/08/2017 at Unknown time  . busPIRone (BUSPAR) 30 MG tablet Take 30 mg by mouth 2 (two) times daily.   10/08/2017 at Unknown time  . cetirizine (ZYRTEC) 10 MG tablet Take 10 mg by mouth daily.   10/08/2017 at Unknown time  . citalopram (CELEXA) 20 MG tablet Take 20 mg by mouth daily.   10/08/2017 at Unknown time  . enalapril (VASOTEC) 20 MG tablet Take 20 mg daily by mouth.   10/08/2017 at Unknown time  . fluticasone (FLONASE) 50 MCG/ACT nasal spray Place 1 spray into both nostrils daily.   10/09/2017 at Unknown time  . Fluticasone-Salmeterol (ADVAIR) 250-50 MCG/DOSE AEPB Inhale 1 puff into the lungs 2 (two) times daily.   10/09/2017 at Unknown time  . gabapentin (NEURONTIN) 400 MG capsule Take 400 mg by mouth 3 (three) times daily.   10/08/2017 at Unknown time  . linaclotide (LINZESS) 145 MCG CAPS capsule Take 145 mcg by mouth daily before breakfast.   10/08/2017 at Unknown time  . losartan (COZAAR) 100 MG tablet Take 100 mg by mouth daily.   10/08/2017 at Unknown time  . meloxicam (MOBIC) 7.5 MG tablet Take 7.5 mg daily by mouth.     .  montelukast (SINGULAIR) 10 MG tablet Take 10 mg at bedtime by mouth.   10/08/2017 at Unknown time  . omega-3 acid ethyl esters (LOVAZA) 1 g capsule Take 1 g 2 (two) times daily by mouth.   10/05/2017  . propranolol (INDERAL) 40 MG tablet Take 20-40 mg by mouth 2 (two) times daily. Take 40 mg in the am and 20 mg at bedtime   10/08/2017 at Unknown time  . sennosides-docusate sodium (SENOKOT-S) 8.6-50 MG tablet Take 1 tablet by mouth daily.   10/08/2017 at Unknown time  . simvastatin (ZOCOR) 20 MG tablet Take 20 mg by mouth daily.   10/08/2017 at Unknown time  . vortioxetine HBr (TRINTELLIX) 10 MG TABS Take 10 mg daily by mouth.   10/08/2017 at Unknown time  . acetaminophen (TYLENOL) 325 MG tablet Take 325 mg by mouth every 6 (six) hours as needed.     Marland Kitchen albuterol (PROVENTIL HFA;VENTOLIN HFA) 108 (90 BASE) MCG/ACT inhaler Inhale 2 puffs into the lungs every 6 (six) hours as needed for wheezing or shortness of breath. 1 Inhaler 2 prn at prn  . benzonatate (TESSALON) 100 MG capsule Take 100 mg by mouth 3 (three) times daily as needed for cough.     . brompheniramine-pseudoephedrine-DM 30-2-10 MG/5ML syrup Take 10 mLs by mouth 4 (four) times daily as needed. 200 mL 0   . cholecalciferol (VITAMIN D) 1000 units tablet Take 1,000 Units by  mouth daily.   08/17/2017 at Unknown time  . clopidogrel (PLAVIX) 75 MG tablet Take 75 mg by mouth daily.   10/05/2017  . dexlansoprazole (DEXILANT) 60 MG capsule Take 60 mg by mouth daily.   08/17/2017 at Unknown time  . ibuprofen (ADVIL,MOTRIN) 600 MG tablet Take 1 tablet (600 mg total) by mouth every 6 (six) hours as needed. 30 tablet 0   . LORazepam (ATIVAN) 1 MG tablet Take 1 tablet (1 mg total) by mouth every 8 (eight) hours as needed for anxiety. 15 tablet 0 prn at prn  . medroxyPROGESTERone (DEPO-PROVERA) 150 MG/ML injection Inject 150 mg into the muscle every 3 (three) months.     . metFORMIN (GLUCOPHAGE) 500 MG tablet Take 500 mg by mouth 2 (two) times daily with a meal.    10/07/2017  . ondansetron (ZOFRAN) 4 MG tablet Take 1 tablet (4 mg total) by mouth daily as needed for nausea or vomiting. 14 tablet 0   . ondansetron (ZOFRAN) 8 MG tablet Take 8 mg by mouth every 8 (eight) hours as needed for nausea or vomiting.     . polyethylene glycol (MIRALAX / GLYCOLAX) packet Take 17 g by mouth every other day.   Past Week at Unknown time     No Known Allergies   Past Medical History:  Diagnosis Date  . Anxiety   . Chronic headaches   . Depression   . Dermatophytosis of nail   . Diabetes mellitus without complication ([place])   . GERD (gastroesophageal reflux disease)   . High cholesterol   . Hypertension   . Mental developmental delay   . Onychia of toe   . Panic attacks     Review of systems:      Physical Exam  General appearance: alert, cooperative and appears stated age Resp: clear to auscultation bilaterally Cardio: regular rate and rhythm, S1, S2 normal, no murmur, click, rub or gallop GI: soft, non-tender; bowel sounds normal; no masses,  no organomegaly     Planned procedures: EGD and colonoscopy. The patient understands the nature of the planned procedure, indications, risks, alternatives and potential complications including but not limited to bleeding, infection, perforation, damage to internal organs and possible oversedation/side effects from anesthesia. The patient agrees and gives consent to proceed.  Please refer to procedure notes for findings, recommendations and patient disposition/instructions.    Britten Seyfried K. Alice Reichert, M.D. Gastroenterology 10/09/2017  2:26 PM

## 2017-10-09 NOTE — Anesthesia Post-op Follow-up Note (Signed)
Anesthesia QCDR form completed.        

## 2017-10-09 NOTE — Interval H&P Note (Signed)
History and Physical Interval Note:  10/09/2017 2:28 PM  Hannah Clarke  has presented today for surgery, with the diagnosis of SCREENING DYSPHAGIA  The various methods of treatment have been discussed with the patient and family. After consideration of risks, benefits and other options for treatment, the patient has consented to  Procedure(s): COLONOSCOPY WITH PROPOFOL (N/A) ESOPHAGOGASTRODUODENOSCOPY (EGD) WITH PROPOFOL (N/A) as a surgical intervention .  The patient's history has been reviewed, patient examined, no change in status, stable for surgery.  I have reviewed the patient's chart and labs.  Questions were answered to the patient's satisfaction.     [place], [place]

## 2017-10-09 NOTE — Anesthesia Preprocedure Evaluation (Signed)
Anesthesia Evaluation  Patient identified by MRN, date of birth, ID band Patient awake    Reviewed: Allergy & Precautions, NPO status , Patient's Chart, lab work & pertinent test results  Airway Mallampati: III       Dental  (+) Teeth Intact   Pulmonary neg pulmonary ROS,     + decreased breath sounds      Cardiovascular Exercise Tolerance: Good hypertension, Pt. on medications + angina  Rhythm:Regular Rate:Normal     Neuro/Psych  Headaches, Anxiety Depression    GI/Hepatic Neg liver ROS, GERD  Medicated,  Endo/Other  diabetes, Type 2, Oral Hypoglycemic AgentsMorbid obesity  Renal/GU negative Renal ROS     Musculoskeletal   Abdominal (+) + obese,   Peds negative pediatric ROS (+) mental retardation Hematology   Anesthesia Other Findings   Reproductive/Obstetrics                             Anesthesia Physical Anesthesia Plan  ASA: III  Anesthesia Plan: General   Post-op Pain Management:    Induction: Intravenous  PONV Risk Score and Plan: 3 and 0  Airway Management Planned: Natural Airway and Nasal Cannula  Additional Equipment:   Intra-op Plan:   Post-operative Plan:   Informed Consent: I have reviewed the patients History and Physical, chart, labs and discussed the procedure including the risks, benefits and alternatives for the proposed anesthesia with the patient or authorized representative who has indicated his/her understanding and acceptance.     Plan Discussed with: CRNA  Anesthesia Plan Comments:         Anesthesia Quick Evaluation

## 2017-10-09 NOTE — Transfer of Care (Signed)
Immediate Anesthesia Transfer of Care Note  Patient: Hannah Clarke  Procedure(s) Performed: COLONOSCOPY WITH PROPOFOL (N/A ) ESOPHAGOGASTRODUODENOSCOPY (EGD) WITH PROPOFOL (N/A )  Patient Location: PACU and Endoscopy Unit  Anesthesia Type:General  Level of Consciousness: awake and patient cooperative  Airway & Oxygen Therapy: Patient Spontanous Breathing and Patient connected to nasal cannula oxygen  Post-op Assessment: Report given to RN and Post -op Vital signs reviewed and stable  Post vital signs: Reviewed and stable  Last Vitals:  Vitals:   10/09/17 1352 10/09/17 1456  BP: 121/79 121/77  Pulse: 70   Resp: 18   Temp: 37.7 C 36.4 C  SpO2: 98%     Last Pain:  Vitals:   10/09/17 1456  TempSrc: Tympanic         Complications: No apparent anesthesia complications

## 2017-10-10 ENCOUNTER — Encounter: Payer: Self-pay | Admitting: Internal Medicine

## 2017-10-10 NOTE — Anesthesia Postprocedure Evaluation (Signed)
Anesthesia Post Note  Patient: Hannah Clarke  Procedure(s) Performed: COLONOSCOPY WITH PROPOFOL (N/A ) ESOPHAGOGASTRODUODENOSCOPY (EGD) WITH PROPOFOL (N/A )  Patient location during evaluation: PACU Anesthesia Type: General Level of consciousness: awake Pain management: pain level controlled Vital Signs Assessment: post-procedure vital signs reviewed and stable Cardiovascular status: stable Anesthetic complications: no     Last Vitals:  Vitals:   10/09/17 1456 10/09/17 1459  BP: 121/77 121/77  Pulse:  72  Resp:  19  Temp: 36.4 C (!) 36.3 C  SpO2:  99%    Last Pain:  Vitals:   10/09/17 1456  TempSrc: Tympanic                 VAN STAVEREN,[name]

## 2017-10-12 LAB — SURGICAL PATHOLOGY

## 2017-10-15 ENCOUNTER — Ambulatory Visit
Admission: RE | Admit: 2017-10-15 | Discharge: 2017-10-15 | Disposition: A | Discharge status: Home / Self Care | Payer: Medicare Other | Source: Ambulatory Visit | Attending: Nurse Practitioner | Admitting: Nurse Practitioner

## 2017-10-15 DIAGNOSIS — R928 Other abnormal and inconclusive findings on diagnostic imaging of breast: Secondary | ICD-10-CM | POA: Insufficient documentation

## 2017-10-15 DIAGNOSIS — N6489 Other specified disorders of breast: Secondary | ICD-10-CM | POA: Insufficient documentation

## 2017-10-15 DIAGNOSIS — Z1231 Encounter for screening mammogram for malignant neoplasm of breast: Secondary | ICD-10-CM | POA: Insufficient documentation

## 2017-10-24 ENCOUNTER — Other Ambulatory Visit: Payer: Self-pay

## 2017-10-24 ENCOUNTER — Emergency Department
Admission: EM | Admit: 2017-10-24 | Discharge: 2017-10-24 | Disposition: A | Discharge status: Home / Self Care | Payer: Medicare Other | Attending: Emergency Medicine | Admitting: Emergency Medicine

## 2017-10-24 ENCOUNTER — Encounter: Payer: Self-pay | Admitting: Emergency Medicine

## 2017-10-24 ENCOUNTER — Emergency Department: Payer: Medicare Other

## 2017-10-24 DIAGNOSIS — J069 Acute upper respiratory infection, unspecified: Secondary | ICD-10-CM | POA: Diagnosis not present

## 2017-10-24 DIAGNOSIS — Z7902 Long term (current) use of antithrombotics/antiplatelets: Secondary | ICD-10-CM | POA: Insufficient documentation

## 2017-10-24 DIAGNOSIS — B9789 Other viral agents as the cause of diseases classified elsewhere: Secondary | ICD-10-CM | POA: Insufficient documentation

## 2017-10-24 DIAGNOSIS — I10 Essential (primary) hypertension: Secondary | ICD-10-CM | POA: Insufficient documentation

## 2017-10-24 DIAGNOSIS — E119 Type 2 diabetes mellitus without complications: Secondary | ICD-10-CM | POA: Diagnosis not present

## 2017-10-24 DIAGNOSIS — Z79899 Other long term (current) drug therapy: Secondary | ICD-10-CM | POA: Insufficient documentation

## 2017-10-24 DIAGNOSIS — Y929 Unspecified place or not applicable: Secondary | ICD-10-CM | POA: Insufficient documentation

## 2017-10-24 DIAGNOSIS — R0981 Nasal congestion: Secondary | ICD-10-CM | POA: Diagnosis present

## 2017-10-24 DIAGNOSIS — Y999 Unspecified external cause status: Secondary | ICD-10-CM | POA: Diagnosis not present

## 2017-10-24 DIAGNOSIS — S300XXA Contusion of lower back and pelvis, initial encounter: Secondary | ICD-10-CM

## 2017-10-24 DIAGNOSIS — Y939 Activity, unspecified: Secondary | ICD-10-CM | POA: Insufficient documentation

## 2017-10-24 DIAGNOSIS — W19XXXA Unspecified fall, initial encounter: Secondary | ICD-10-CM | POA: Diagnosis not present

## 2017-10-24 LAB — POCT PREGNANCY, URINE: PREG TEST UR: NEGATIVE

## 2017-10-24 MED ORDER — PSEUDOEPH-BROMPHEN-DM 30-2-10 MG/5ML PO SYRP: 10.0000 mL | ORAL_SOLUTION | Freq: Four times a day (QID) | ORAL | 0 refills | Status: DC | PRN

## 2017-10-24 MED ORDER — MELOXICAM 15 MG PO TABS: 15.0000 mg | ORAL_TABLET | Freq: Every day | ORAL | 0 refills | Status: DC

## 2017-10-24 NOTE — ED Provider Notes (Signed)
[facility] Emergency Department Provider Note  ____________________________________________  Time seen: Approximately 7:41 PM  I have reviewed the triage vital signs and the nursing notes.   HISTORY  Chief Complaint Back Pain and URI    HPI Hannah Clarke is a 51 y.o. female who presents emergency department complaining of URI symptoms of nasal, sore throat, ear pressure times 3 days.  Patient denies any headache, visual changes, difficulty breathing or swallowing.  Patient reports that sometimes she coughs until she becomes nauseated but denies any abdominal pain or emesis.  Patient uses daily Flonase and Zyrtec and has been using same.  No other medications for this complaint.  Patient denies any fevers but endorses chills.  Patient is also endorsing back pain status post a fall that occurred 3 weeks ago.  She denies any bowel or bladder dysfunction, saddle anesthesia, paresthesias.  She is ambulatory but states that it hurts all the time.  No specific motion increases or improves the pain.  Patient has significant past medical history but denies any complaints with chronic medical conditions.  Past Medical History:  Diagnosis Date  . Anxiety   . Chronic headaches   . Depression   . Dermatophytosis of nail   . Diabetes mellitus without complication ([place])   . GERD (gastroesophageal reflux disease)   . High cholesterol   . Hypertension   . Mental developmental delay   . Onychia of toe   . Panic attacks     Patient Active Problem List   Diagnosis Date Noted  . Unstable angina ([place]) 08/17/2017    Past Surgical History:  Procedure Laterality Date  . COLONOSCOPY WITH PROPOFOL N/A 10/09/2017   Procedure: COLONOSCOPY WITH PROPOFOL;  Surgeon: Toledo, Benay Pike, MD;  Location: [facility] ENDOSCOPY;  Service: Gastroenterology;  Laterality: N/A;  . ESOPHAGOGASTRODUODENOSCOPY (EGD) WITH PROPOFOL N/A 10/09/2017   Procedure: ESOPHAGOGASTRODUODENOSCOPY (EGD) WITH  PROPOFOL;  Surgeon: Toledo, Benay Pike, MD;  Location: [facility] ENDOSCOPY;  Service: Gastroenterology;  Laterality: N/A;  . HAND SURGERY    . KIDNEY STONE SURGERY      Prior to Admission medications   Medication Sig Start Date End Date Taking? Authorizing Provider  acetaminophen (TYLENOL) 325 MG tablet Take 325 mg by mouth every 6 (six) hours as needed.    [provider]  albuterol (PROVENTIL HFA;VENTOLIN HFA) 108 (90 BASE) MCG/ACT inhaler Inhale 2 puffs into the lungs every 6 (six) hours as needed for wheezing or shortness of breath. 11/07/15   Orbie Pyo, MD  amLODipine (NORVASC) 5 MG tablet Take 5 mg by mouth daily.    [provider]  benzonatate (TESSALON) 100 MG capsule Take 100 mg by mouth 3 (three) times daily as needed for cough.    [provider]  brompheniramine-pseudoephedrine-DM 30-2-10 MG/5ML syrup Take 10 mLs by mouth 4 (four) times daily as needed. 10/24/17   Cuthriell, Charline Bills, PA-C  buPROPion (WELLBUTRIN SR) 100 MG 12 hr tablet Take 100 mg by mouth 2 (two) times daily.    [provider]  busPIRone (BUSPAR) 30 MG tablet Take 30 mg by mouth 2 (two) times daily.    [provider]  cetirizine (ZYRTEC) 10 MG tablet Take 10 mg by mouth daily.    [provider]  cholecalciferol (VITAMIN D) 1000 units tablet Take 1,000 Units by mouth daily.    [provider]  citalopram (CELEXA) 20 MG tablet Take 20 mg by mouth daily.    [provider]  clopidogrel (PLAVIX)  75 MG tablet Take 75 mg by mouth daily.    [provider]  dexlansoprazole (DEXILANT) 60 MG capsule Take 60 mg by mouth daily.    [provider]  enalapril (VASOTEC) 20 MG tablet Take 20 mg daily by mouth.    [provider]  fluticasone (FLONASE) 50 MCG/ACT nasal spray Place 1 spray into both nostrils daily.    [provider]  Fluticasone-Salmeterol (ADVAIR) 250-50 MCG/DOSE AEPB Inhale 1 puff into the  lungs 2 (two) times daily.    [provider]  gabapentin (NEURONTIN) 400 MG capsule Take 400 mg by mouth 3 (three) times daily.    [provider]  ibuprofen (ADVIL,MOTRIN) 600 MG tablet Take 1 tablet (600 mg total) by mouth every 6 (six) hours as needed. 01/23/17   Triplett, Dessa Phi, FNP  linaclotide (LINZESS) 145 MCG CAPS capsule Take 145 mcg by mouth daily before breakfast.    [provider]  LORazepam (ATIVAN) 1 MG tablet Take 1 tablet (1 mg total) by mouth every 8 (eight) hours as needed for anxiety. 02/03/16   Paulette Blanch, MD  losartan (COZAAR) 100 MG tablet Take 100 mg by mouth daily.    [provider]  medroxyPROGESTERone (DEPO-PROVERA) 150 MG/ML injection Inject 150 mg into the muscle every 3 (three) months.    [provider]  meloxicam (MOBIC) 15 MG tablet Take 1 tablet (15 mg total) by mouth daily. 10/24/17   Cuthriell, Charline Bills, PA-C  metFORMIN (GLUCOPHAGE) 500 MG tablet Take 500 mg by mouth 2 (two) times daily with a meal.    [provider]  montelukast (SINGULAIR) 10 MG tablet Take 10 mg at bedtime by mouth.    [provider]  omega-3 acid ethyl esters (LOVAZA) 1 g capsule Take 1 g 2 (two) times daily by mouth.    [provider]  ondansetron (ZOFRAN) 4 MG tablet Take 1 tablet (4 mg total) by mouth daily as needed for nausea or vomiting. 09/18/17 09/18/18  [name], MD  ondansetron (ZOFRAN) 8 MG tablet Take 8 mg by mouth every 8 (eight) hours as needed for nausea or vomiting.    [provider]  polyethylene glycol (MIRALAX / GLYCOLAX) packet Take 17 g by mouth every other day.    [provider]  propranolol (INDERAL) 40 MG tablet Take 20-40 mg by mouth 2 (two) times daily. Take 40 mg in the am and 20 mg at bedtime    [provider]  sennosides-docusate sodium (SENOKOT-S) 8.6-50 MG tablet Take 1 tablet by mouth daily.    [provider]  simvastatin (ZOCOR) 20 MG  tablet Take 20 mg by mouth daily.    [provider]  vortioxetine HBr (TRINTELLIX) 10 MG TABS Take 10 mg daily by mouth.    [provider]    Allergies Patient has no known allergies.  Family History  Problem Relation Age of Onset  . Breast cancer Mother 18       pre pt.     Social History Social History   Tobacco Use  . Smoking status: Never Smoker  . Smokeless tobacco: Never Used  Substance Use Topics  . Alcohol use: Yes    Comment: OCCASSIONALLY  . Drug use: No     Review of Systems  Constitutional: No fever positive for chills Eyes: No visual changes. No discharge ENT: Positive for nasal congestion, sore throat, ear pressure Cardiovascular: no chest pain. Respiratory: Positive cough. No SOB. Gastrointestinal: No  abdominal pain.  No nausea, no vomiting.  No diarrhea.  No constipation. Genitourinary: Negative for dysuria. No hematuria Musculoskeletal: Positive for lower back pain Skin: Negative for rash, abrasions, lacerations, ecchymosis. Neurological: Negative for headaches, focal weakness or numbness. 10-point ROS otherwise negative.  ____________________________________________   PHYSICAL EXAM:  VITAL SIGNS: ED Triage Vitals  Enc Vitals Group     BP 10/24/17 1810 (!) 137/97     Pulse Rate 10/24/17 1810 82     Resp 10/24/17 1810 18     Temp 10/24/17 1810 98.3 F (36.8 C)     Temp Source 10/24/17 1810 Oral     SpO2 10/24/17 1810 100 %     Weight 10/24/17 1810 177 lb (80.3 kg)     Height 10/24/17 1810 5\' 2"  (1.575 m)     Head Circumference --      Peak Flow --      Pain Score 10/24/17 1809 9     Pain Loc --      Pain Edu? --      Excl. in [place]? --      Constitutional: Alert and oriented. Well appearing and in no acute distress. Eyes: Conjunctivae are normal. PERRL. EOMI. Head: Atraumatic. ENT:      Ears: EACs unremarkable bilaterally.  TMs are mildly bulging bilaterally.      Nose: Moderate congestion/rhinnorhea.  Polyp noted  on left nares.      Mouth/Throat: Mucous membranes are moist.  Oropharynx is mildly erythematous but nonedematous.  Uvula is midline. Neck: No stridor.  Neck is supple with full range of motion Hematological/Lymphatic/Immunilogical: No cervical lymphadenopathy. Cardiovascular: Normal rate, regular rhythm. Normal S1 and S2.  Good peripheral circulation. Respiratory: Normal respiratory effort without tachypnea or retractions. Lungs CTAB. Good air entry to the bases with no decreased or absent breath sounds. Gastrointestinal: Bowel sounds 4 quadrants. Soft and nontender to palpation. No guarding or rigidity. No palpable masses. No distention. No CVA tenderness. Musculoskeletal: Full range of motion to all extremities. No gross deformities appreciated.  No visible deformity to spine.  No ecchymosis, abrasions, lacerations noted.  Patient has diffusely tender throughout the lumbar spine region with no palpable abnormality or point tenderness.  No step-off noted.  Dorsalis pedis pulses and sensation intact bilateral lower extremities. Neurologic:  Normal speech and language. No gross focal neurologic deficits are appreciated.  Skin:  Skin is warm, dry and intact. No rash noted. Psychiatric: Mood and affect are normal. Speech and behavior are normal. Patient exhibits appropriate insight and judgement.   ____________________________________________   LABS (all labs ordered are listed, but only abnormal results are displayed)  Labs Reviewed  POCT PREGNANCY, URINE   ____________________________________________  EKG   ____________________________________________  RADIOLOGY Diamantina Providence Cuthriell, personally viewed and evaluated these images (plain radiographs) as part of my medical decision making, as well as reviewing the written report by the radiologist.  Dg Chest 2 View  Result Date: 10/24/2017 CLINICAL DATA:  Cough and upper respiratory infection symptoms. EXAM: CHEST  2 VIEW  COMPARISON:  08/17/2017 FINDINGS: The cardiomediastinal contours are normal. Bronchial thickening is chronic and unchanged from prior. Pulmonary vasculature is normal. No consolidation, pleural effusion, or pneumothorax. No acute osseous abnormalities are seen. IMPRESSION: No acute pulmonary process. Chronic bronchial thickening, unchanged. Electronically Signed   By: Jeb Levering M.D.   On: 10/24/2017 20:44   Dg Lumbar Spine Complete  Result Date: 10/24/2017 CLINICAL DATA:  Fall 3 weeks prior, persistent lumbar back pain. EXAM: LUMBAR SPINE -  COMPLETE 4+ VIEW COMPARISON:  None. FINDINGS: The alignment is maintained. Vertebral body heights are normal. There is no listhesis. The posterior elements are intact. Disc spaces are preserved, minimal endplate spurring at multiple levels. No fracture. Sacroiliac joints are congruent. IMPRESSION: 1. No fracture or subluxation of the lumbar spine. 2. Minimal spondylosis. Electronically Signed   By: Jeb Levering M.D.   On: 10/24/2017 20:46    ____________________________________________    PROCEDURES  Procedure(s) performed:    Procedures    Medications - No data to display   ____________________________________________   INITIAL IMPRESSION / ASSESSMENT AND PLAN / ED COURSE  Pertinent labs & imaging results that were available during my care of the patient were reviewed by me and considered in my medical decision making (see chart for details).  Review of the [place] CSRS was performed in accordance of the [place] prior to dispensing any controlled drugs.     Patient's diagnosis is consistent with viral URI and lower back contusion.  Patient presented with complaints of URI symptoms for several days.  Exam was reassuring.  Chest x-ray reveals no consolidation consistent with pneumonia.  Patient also complained of lower back pain from a fall 3 weeks ago.  X-ray reveals no acute fracture.  Differential included viral URI, sinusitis, strep,  pneumonia, bronchitis, lumbar fracture, lumbar contusion.. Patient will be discharged home with prescriptions for meloxicam and bromfed. Patient is to follow up with primary care as needed or otherwise directed. Patient is given ED precautions to return to the ED for any worsening or new symptoms.     ____________________________________________  FINAL CLINICAL IMPRESSION(S) / ED DIAGNOSES  Final diagnoses:  Viral URI with cough  Contusion of lower back, initial encounter      NEW MEDICATIONS STARTED DURING THIS VISIT:  ED Discharge Orders        Ordered    brompheniramine-pseudoephedrine-DM 30-2-10 MG/5ML syrup  4 times daily PRN     10/24/17 2105    meloxicam (MOBIC) 15 MG tablet  Daily     10/24/17 2105          This chart was dictated using voice recognition software/Dragon. Despite best efforts to proofread, errors can occur which can change the meaning. Any change was purely unintentional.    Darletta Moll, PA-C 10/24/17 2115    Arta Silence, MD 10/24/17 2316

## 2017-10-24 NOTE — ED Notes (Signed)
Pt reports headache, runny nose and cough for two days.

## 2017-10-24 NOTE — ED Notes (Signed)
ED Provider at bedside. 

## 2017-10-24 NOTE — ED Notes (Signed)
Patient returned from xray.

## 2017-10-24 NOTE — ED Notes (Signed)
Patient transported to X-ray 

## 2017-10-24 NOTE — ED Triage Notes (Signed)
Pt presents with back pain after a fall a couple of weeks ago and URI symptoms since the weekend. Pt emotional during triage. Pt alert & oriented with NAD noted.

## 2017-11-17 ENCOUNTER — Other Ambulatory Visit: Payer: Self-pay

## 2017-11-17 ENCOUNTER — Emergency Department: Payer: Medicare Other

## 2017-11-17 ENCOUNTER — Encounter: Payer: Self-pay | Admitting: Emergency Medicine

## 2017-11-17 ENCOUNTER — Emergency Department
Admission: EM | Admit: 2017-11-17 | Discharge: 2017-11-17 | Disposition: A | Discharge status: Home / Self Care | Payer: Medicare Other | Attending: Emergency Medicine | Admitting: Emergency Medicine

## 2017-11-17 DIAGNOSIS — R110 Nausea: Secondary | ICD-10-CM | POA: Diagnosis present

## 2017-11-17 DIAGNOSIS — E119 Type 2 diabetes mellitus without complications: Secondary | ICD-10-CM | POA: Insufficient documentation

## 2017-11-17 DIAGNOSIS — Z7984 Long term (current) use of oral hypoglycemic drugs: Secondary | ICD-10-CM | POA: Diagnosis not present

## 2017-11-17 DIAGNOSIS — I10 Essential (primary) hypertension: Secondary | ICD-10-CM | POA: Insufficient documentation

## 2017-11-17 DIAGNOSIS — F329 Major depressive disorder, single episode, unspecified: Secondary | ICD-10-CM | POA: Insufficient documentation

## 2017-11-17 DIAGNOSIS — Z7902 Long term (current) use of antithrombotics/antiplatelets: Secondary | ICD-10-CM | POA: Insufficient documentation

## 2017-11-17 DIAGNOSIS — Z79899 Other long term (current) drug therapy: Secondary | ICD-10-CM | POA: Diagnosis not present

## 2017-11-17 DIAGNOSIS — F419 Anxiety disorder, unspecified: Secondary | ICD-10-CM | POA: Diagnosis not present

## 2017-11-17 DIAGNOSIS — K219 Gastro-esophageal reflux disease without esophagitis: Secondary | ICD-10-CM

## 2017-11-17 LAB — BASIC METABOLIC PANEL
ANION GAP: 9 (ref 5–15)
BUN: 19 mg/dL (ref 6–20)
CHLORIDE: 112 mmol/L — AB (ref 101–111)
CO2: 19 mmol/L — ABNORMAL LOW (ref 22–32)
Calcium: 9.2 mg/dL (ref 8.9–10.3)
Creatinine, Ser: 0.56 mg/dL (ref 0.44–1.00)
GFR calc Af Amer: >60 mL/min (ref >60)
GLUCOSE: 143 mg/dL — AB (ref 65–99)
POTASSIUM: 3.4 mmol/L — AB (ref 3.5–5.1)
Sodium: 140 mmol/L (ref 135–145)

## 2017-11-17 LAB — CBC
HEMATOCRIT: 37.7 % (ref 35.0–47.0)
HEMOGLOBIN: 12.8 g/dL (ref 12.0–16.0)
MCH: 30 pg (ref 26.0–34.0)
MCHC: 34.1 g/dL (ref 32.0–36.0)
MCV: 88.1 fL (ref 80.0–100.0)
Platelets: 261 10*3/uL (ref 150–440)
RBC: 4.28 MIL/uL (ref 3.80–5.20)
RDW: 14.3 % (ref 11.5–14.5)
WBC: 6.8 10*3/uL (ref 3.6–11.0)

## 2017-11-17 LAB — POCT PREGNANCY, URINE
PREG TEST UR: NEGATIVE
PREG TEST UR: NEGATIVE

## 2017-11-17 LAB — LIPASE, BLOOD: LIPASE: 19 U/L (ref 11–51)

## 2017-11-17 LAB — HEPATIC FUNCTION PANEL
ALT: 18 U/L (ref 14–54)
AST: 22 U/L (ref 15–41)
Albumin: 4.3 g/dL (ref 3.5–5.0)
Alkaline Phosphatase: 48 U/L (ref 38–126)
Total Bilirubin: 0.4 mg/dL (ref 0.3–1.2)
Total Protein: 6.7 g/dL (ref 6.5–8.1)

## 2017-11-17 LAB — GLUCOSE, CAPILLARY: GLUCOSE-CAPILLARY: 135 mg/dL — AB (ref 65–99)

## 2017-11-17 LAB — TROPONIN I: Troponin I: <0.03 ng/mL (ref <0.03)

## 2017-11-17 MED ORDER — GI COCKTAIL ~~LOC~~
ORAL | Status: AC
  Administered 2017-11-17: 30 mL via ORAL
  Filled 2017-11-17: qty 30

## 2017-11-17 MED ORDER — GI COCKTAIL ~~LOC~~
30.0000 mL | Freq: Once | ORAL | Status: AC
  Administered 2017-11-17: 30 mL via ORAL

## 2017-11-17 NOTE — ED Provider Notes (Addendum)
[facility] Emergency Department Provider Note  ____________________________________________   I have reviewed the triage vital signs and the nursing notes. Where available I have reviewed prior notes and, if possible and indicated, outside hospital notes.    HISTORY  Chief Complaint Chest Pain    HPI Hannah Clarke is a 51 y.o. female who presents today complaining of a burning sensation in her throat with a bad taste, coming from her stomach which has been there for 6-8 months.  Patient states is active in there for years but worse over the last several months.  She does have history of developmental delay anxiety chronic headaches depression reflux disease, she states is similar to her reflux.  She has been taking her medications she states.  She has no chest pain or shortness of breath aside from this.  Patient was admitted for similar in September and at that time she was found to have a negative CTA and this pain was thought to be noncardiac in origin most likely reflux.  Patient does endorse these symptoms as being the same as what brought her in several months ago and states that have not completely gone away.  She denies any cough but she states sometimes she wheezes.  She is not wheezing now.  She states is a burning discomfort that is worse with food nothing else makes it better no other alleviating or aggravating factors and has taken her home medications.  States that she does have nausea sometimes when her GERD gets bad but she has not actually vomited and she has not vomited here she denies diarrhea.    Past Medical History:  Diagnosis Date  . Anxiety   . Chronic headaches   . Depression   . Dermatophytosis of nail   . Diabetes mellitus without complication ([place])   . GERD (gastroesophageal reflux disease)   . High cholesterol   . Hypertension   . Mental developmental delay   . Onychia of toe   . Panic attacks     Patient Active Problem List    Diagnosis Date Noted  . Unstable angina ([place]) 08/17/2017    Past Surgical History:  Procedure Laterality Date  . COLONOSCOPY WITH PROPOFOL N/A 10/09/2017   Procedure: COLONOSCOPY WITH PROPOFOL;  Surgeon: Toledo, Benay Pike, MD;  Location: [facility] ENDOSCOPY;  Service: Gastroenterology;  Laterality: N/A;  . ESOPHAGOGASTRODUODENOSCOPY (EGD) WITH PROPOFOL N/A 10/09/2017   Procedure: ESOPHAGOGASTRODUODENOSCOPY (EGD) WITH PROPOFOL;  Surgeon: Toledo, Benay Pike, MD;  Location: [facility] ENDOSCOPY;  Service: Gastroenterology;  Laterality: N/A;  . HAND SURGERY    . KIDNEY STONE SURGERY      Prior to Admission medications   Medication Sig Start Date End Date Taking? Authorizing Provider  acetaminophen (TYLENOL) 325 MG tablet Take 325 mg by mouth every 6 (six) hours as needed.    [provider]  albuterol (PROVENTIL HFA;VENTOLIN HFA) 108 (90 BASE) MCG/ACT inhaler Inhale 2 puffs into the lungs every 6 (six) hours as needed for wheezing or shortness of breath. 11/07/15   Orbie Pyo, MD  amLODipine (NORVASC) 5 MG tablet Take 5 mg by mouth daily.    [provider]  benzonatate (TESSALON) 100 MG capsule Take 100 mg by mouth 3 (three) times daily as needed for cough.    [provider]  brompheniramine-pseudoephedrine-DM 30-2-10 MG/5ML syrup Take 10 mLs by mouth 4 (four) times daily as needed. 10/24/17   Cuthriell, Charline Bills, PA-C  buPROPion (WELLBUTRIN SR) 100 MG 12 hr tablet Take  100 mg by mouth 2 (two) times daily.    [provider]  busPIRone (BUSPAR) 30 MG tablet Take 30 mg by mouth 2 (two) times daily.    [provider]  cetirizine (ZYRTEC) 10 MG tablet Take 10 mg by mouth daily.    [provider]  cholecalciferol (VITAMIN D) 1000 units tablet Take 1,000 Units by mouth daily.    [provider]  citalopram (CELEXA) 20 MG tablet Take 20 mg by mouth daily.    [provider]  clopidogrel (PLAVIX) 75 MG tablet Take 75 mg by  mouth daily.    [provider]  dexlansoprazole (DEXILANT) 60 MG capsule Take 60 mg by mouth daily.    [provider]  enalapril (VASOTEC) 20 MG tablet Take 20 mg daily by mouth.    [provider]  fluticasone (FLONASE) 50 MCG/ACT nasal spray Place 1 spray into both nostrils daily.    [provider]  Fluticasone-Salmeterol (ADVAIR) 250-50 MCG/DOSE AEPB Inhale 1 puff into the lungs 2 (two) times daily.    [provider]  gabapentin (NEURONTIN) 400 MG capsule Take 400 mg by mouth 3 (three) times daily.    [provider]  ibuprofen (ADVIL,MOTRIN) 600 MG tablet Take 1 tablet (600 mg total) by mouth every 6 (six) hours as needed. 01/23/17   Triplett, Dessa Phi, FNP  linaclotide (LINZESS) 145 MCG CAPS capsule Take 145 mcg by mouth daily before breakfast.    [provider]  LORazepam (ATIVAN) 1 MG tablet Take 1 tablet (1 mg total) by mouth every 8 (eight) hours as needed for anxiety. 02/03/16   Paulette Blanch, MD  losartan (COZAAR) 100 MG tablet Take 100 mg by mouth daily.    [provider]  medroxyPROGESTERone (DEPO-PROVERA) 150 MG/ML injection Inject 150 mg into the muscle every 3 (three) months.    [provider]  meloxicam (MOBIC) 15 MG tablet Take 1 tablet (15 mg total) by mouth daily. 10/24/17   Cuthriell, Charline Bills, PA-C  metFORMIN (GLUCOPHAGE) 500 MG tablet Take 500 mg by mouth 2 (two) times daily with a meal.    [provider]  montelukast (SINGULAIR) 10 MG tablet Take 10 mg at bedtime by mouth.    [provider]  omega-3 acid ethyl esters (LOVAZA) 1 g capsule Take 1 g 2 (two) times daily by mouth.    [provider]  ondansetron (ZOFRAN) 4 MG tablet Take 1 tablet (4 mg total) by mouth daily as needed for nausea or vomiting. 09/18/17 09/18/18  [name], MD  ondansetron (ZOFRAN) 8 MG tablet Take 8 mg by mouth every 8 (eight) hours as needed for nausea or vomiting.    [provider]  polyethylene glycol (MIRALAX / GLYCOLAX) packet Take 17 g by mouth every other day.    [provider]  propranolol (INDERAL) 40 MG tablet Take 20-40 mg by mouth 2 (two) times daily. Take 40 mg in the am and 20 mg at bedtime    [provider]  sennosides-docusate sodium (SENOKOT-S) 8.6-50 MG tablet Take 1 tablet by mouth daily.    [provider]  simvastatin (ZOCOR) 20 MG tablet Take 20 mg by mouth daily.    [provider]  vortioxetine HBr (TRINTELLIX) 10 MG TABS Take 10 mg daily by mouth.    [provider]    Allergies Patient has no known allergies.  Family History  Problem Relation Age of Onset  . Breast cancer  Mother 51       pre pt.     Social History Social History   Tobacco Use  . Smoking status: Never Smoker  . Smokeless tobacco: Never Used  Substance Use Topics  . Alcohol use: Yes    Comment: OCCASSIONALLY  . Drug use: No    Review of Systems Constitutional: No fever/chills Eyes: No visual changes. ENT: No sore throat. No stiff neck no neck pain Cardiovascular: Denies chest pain. Respiratory: Denies shortness of breath. Gastrointestinal:   no vomiting.  No diarrhea.  No constipation. Genitourinary: Negative for dysuria. Musculoskeletal: Negative lower extremity swelling Skin: Negative for rash. Neurological: Negative for severe headaches, focal weakness or numbness.   ____________________________________________   PHYSICAL EXAM:  VITAL SIGNS: ED Triage Vitals  Enc Vitals Group     BP 11/17/17 2017 124/90     Pulse Rate 11/17/17 2017 98     Resp 11/17/17 2017 18     Temp 11/17/17 2017 98.2 F (36.8 C)     Temp Source 11/17/17 2017 Oral     SpO2 11/17/17 2017 98 %     Weight --      Height --      Head Circumference --      Peak Flow --      Pain Score 11/17/17 2016 9     Pain Loc --      Pain Edu? --      Excl. in [place]? --     Constitutional: Alert and oriented. Well appearing  and in no acute distress. Eyes: Conjunctivae are normal Head: Atraumatic HEENT: No congestion/rhinnorhea. Mucous membranes are moist.  Oropharynx non-erythematous Neck:   Nontender with no meningismus, no masses, no stridor Cardiovascular: Normal rate, regular rhythm. Grossly normal heart sounds.  Good peripheral circulation. Respiratory: Normal respiratory effort.  No retractions. Lungs CTAB. Abdominal: Soft and nontender. No distention. No guarding no rebound Back:  There is no focal tenderness or step off.  there is no midline tenderness there are no lesions noted. there is no CVA tenderness Musculoskeletal: No lower extremity tenderness, no upper extremity tenderness. No joint effusions, no DVT signs strong distal pulses no edema Neurologic:  Normal speech and language. No gross focal neurologic deficits are appreciated.  Skin:  Skin is warm, dry and intact. No rash noted. Psychiatric: Mood and affect are normal. Speech and behavior are normal.  ____________________________________________   LABS (all labs ordered are listed, but only abnormal results are displayed)  Labs Reviewed  BASIC METABOLIC PANEL - Abnormal; Notable for the following components:      Result Value   Potassium 3.4 (*)    Chloride 112 (*)    CO2 19 (*)    Glucose, Bld 143 (*)    All other components within normal limits  GLUCOSE, CAPILLARY - Abnormal; Notable for the following components:   Glucose-Capillary 135 (*)    All other components within normal limits  CBC  TROPONIN I  HEPATIC FUNCTION PANEL  LIPASE, BLOOD  POC URINE PREG, ED  POCT PREGNANCY, URINE  POCT PREGNANCY, URINE    Pertinent labs  results that were available during my care of the patient were reviewed by me and considered in my medical decision making (see chart for details). ____________________________________________  EKG  I personally interpreted any EKGs ordered by me or triage No sinus rhythm at 69 bpm no acute ST elevation  no acute ST depression, flipped T waves noted anteriorly, no ischemic change from prior EKG. ____________________________________________  RADIOLOGY  Pertinent labs & imaging results that were available during my care of the patient were reviewed by me and considered in my medical decision making (see chart for details). If possible, patient and/or family made aware of any abnormal findings.  Dg Chest 2 View  Result Date: 11/17/2017 CLINICAL DATA:  Chest pain, nausea, vomiting and diarrhea x2 days. EXAM: CHEST  2 VIEW COMPARISON:  10/24/2017 FINDINGS: The heart size and mediastinal contours are within normal limits. Both lungs are clear. The visualized skeletal structures are unremarkable. IMPRESSION: No active cardiopulmonary disease. Electronically Signed   By: Ashley Royalty M.D.   On: 11/17/2017 20:43   ____________________________________________    PROCEDURES  Procedure(s) performed: None  Procedures  Critical Care performed: None  ____________________________________________   INITIAL IMPRESSION / ASSESSMENT AND PLAN / ED COURSE  Pertinent labs & imaging results that were available during my care of the patient were reviewed by me and considered in my medical decision making (see chart for details).  Patient here with symptoms of reflux we will give her a GI cocktail to see if it helps.  She states she has been wheezing but I do not appreciate that, we will obtain chest x-ray.  Very reassuring exam and findings.  EKG unchanged from prior.  ----------------------------------------- 10:23 PM on 11/17/2017 -----------------------------------------  She had a GI cocktail all of her symptoms disappeared while this is not definitively rule out cardiac disease, certainly makes things much less likely to be cardiac and fits with her clinical diagnosis which has been substantiated by prior admission with full cardiac workup.  At this time, there does not appear to be clinical evidence  to support the diagnosis of pulmonary embolus, dissection, myocarditis, endocarditis, pericarditis, pericardial tamponade, acute coronary syndrome, pneumothorax, pneumonia, or any other acute intrathoracic pathology that will require admission or acute intervention. Nor is there evidence of any significant intra-abdominal pathology causing this discomfort.  Considering the patient's symptoms, medical history, and physical examination today, I have low suspicion for cholecystitis or biliary pathology, pancreatitis, perforation or bowel obstruction, hernia, intra-abdominal abscess, AAA or dissection, volvulus or intussusception, mesenteric ischemia, ischemic gut, pyelonephritis or appendicitis.      ____________________________________________   FINAL CLINICAL IMPRESSION(S) / ED DIAGNOSES  Final diagnoses:  None      This chart was dictated using voice recognition software.  Despite best efforts to proofread,  errors can occur which can change meaning.      Schuyler Amor, MD 11/17/17 2139    Schuyler Amor, MD 11/17/17 2224    Schuyler Amor, MD 11/17/17 2228

## 2017-11-17 NOTE — ED Notes (Signed)
CBG 135, patient stated she has not been eating well recently d/t the nausea but has continued taking her diabetes medicine.

## 2017-11-17 NOTE — ED Notes (Signed)
Calls to mother of pt and approx 15 mins of call to [employer] yielded call back, and taxi being sent att Pt to waiting room

## 2017-11-17 NOTE — ED Triage Notes (Signed)
Pt came by taxi with co chest pain and n/v/d for the last 2 days.  She states she has not been eating much because of the nausea.  She characterizes the CP as burning sensation.  She also says she has been wheezing at home.

## 2017-11-17 NOTE — ED Notes (Signed)
ED Provider at bedside. 

## 2017-11-28 ENCOUNTER — Emergency Department
Admission: EM | Admit: 2017-11-28 | Discharge: 2017-11-28 | Disposition: A | Discharge status: Home / Self Care | Payer: Medicare Other | Attending: Student in an Organized Health Care Education/Training Program | Admitting: Student in an Organized Health Care Education/Training Program

## 2017-11-28 ENCOUNTER — Encounter: Payer: Self-pay | Admitting: Emergency Medicine

## 2017-11-28 ENCOUNTER — Other Ambulatory Visit: Payer: Self-pay

## 2017-11-28 DIAGNOSIS — I10 Essential (primary) hypertension: Secondary | ICD-10-CM | POA: Insufficient documentation

## 2017-11-28 DIAGNOSIS — E119 Type 2 diabetes mellitus without complications: Secondary | ICD-10-CM | POA: Diagnosis not present

## 2017-11-28 DIAGNOSIS — J069 Acute upper respiratory infection, unspecified: Secondary | ICD-10-CM | POA: Diagnosis not present

## 2017-11-28 DIAGNOSIS — R509 Fever, unspecified: Secondary | ICD-10-CM | POA: Diagnosis present

## 2017-11-28 DIAGNOSIS — R110 Nausea: Secondary | ICD-10-CM | POA: Diagnosis not present

## 2017-11-28 DIAGNOSIS — Z7902 Long term (current) use of antithrombotics/antiplatelets: Secondary | ICD-10-CM | POA: Diagnosis not present

## 2017-11-28 DIAGNOSIS — Z7984 Long term (current) use of oral hypoglycemic drugs: Secondary | ICD-10-CM | POA: Diagnosis not present

## 2017-11-28 LAB — GROUP A STREP BY PCR: Group A Strep by PCR: NOT DETECTED

## 2017-11-28 LAB — INFLUENZA PANEL BY PCR (TYPE A & B)
Influenza A By PCR: NEGATIVE
Influenza B By PCR: NEGATIVE

## 2017-11-28 MED ORDER — AMOXICILLIN 500 MG PO CAPS
500.0000 mg | ORAL_CAPSULE | Freq: Three times a day (TID) | ORAL | Status: DC
  Administered 2017-11-28: 500 mg via ORAL
  Filled 2017-11-28: qty 1

## 2017-11-28 MED ORDER — AMOXICILLIN 875 MG PO TABS: 875.0000 mg | ORAL_TABLET | Freq: Two times a day (BID) | ORAL | 0 refills | Status: DC

## 2017-11-28 NOTE — Discharge Instructions (Signed)
You have been given a prescription for amoxicillin, take 1 twice a day for 10 days, take Tylenol or Advil for fever as needed, follow-up with your doctor if you are not better in 3-5 days, return to the emergency department if your worsening

## 2017-11-28 NOTE — ED Provider Notes (Signed)
[facility] Emergency Department Provider Note  ____________________________________________   First MD Initiated Contact with Patient 11/28/17 2126     (approximate)  I have reviewed the triage vital signs and the nursing notes.   HISTORY  Chief Complaint Headache; Sore Throat; and Nausea    HPI Hannah Clarke is a 51 y.o. female complains of fever, headache, sore throat, and nausea, she states she saw her primary care doctor and was told she had a sinus infection but they did not give her any medication, she denies vomiting or diarrhea, she is a poor historian, she lives in a group home  Past Medical History:  Diagnosis Date  . Anxiety   . Chronic headaches   . Depression   . Dermatophytosis of nail   . Diabetes mellitus without complication ([place])   . GERD (gastroesophageal reflux disease)   . High cholesterol   . Hypertension   . Mental developmental delay   . Onychia of toe   . Panic attacks     Patient Active Problem List   Diagnosis Date Noted  . Unstable angina ([place]) 08/17/2017    Past Surgical History:  Procedure Laterality Date  . COLONOSCOPY WITH PROPOFOL N/A 10/09/2017   Procedure: COLONOSCOPY WITH PROPOFOL;  Surgeon: Toledo, Benay Pike, MD;  Location: [facility] ENDOSCOPY;  Service: Gastroenterology;  Laterality: N/A;  . ESOPHAGOGASTRODUODENOSCOPY (EGD) WITH PROPOFOL N/A 10/09/2017   Procedure: ESOPHAGOGASTRODUODENOSCOPY (EGD) WITH PROPOFOL;  Surgeon: Toledo, Benay Pike, MD;  Location: [facility] ENDOSCOPY;  Service: Gastroenterology;  Laterality: N/A;  . HAND SURGERY    . KIDNEY STONE SURGERY      Prior to Admission medications   Medication Sig Start Date End Date Taking? Authorizing Provider  acetaminophen (TYLENOL) 325 MG tablet Take 325 mg by mouth every 6 (six) hours as needed.    [provider]  albuterol (PROVENTIL HFA;VENTOLIN HFA) 108 (90 BASE) MCG/ACT inhaler Inhale 2 puffs into the lungs every 6 (six) hours as needed for  wheezing or shortness of breath. 11/07/15   Orbie Pyo, MD  amLODipine (NORVASC) 5 MG tablet Take 5 mg by mouth daily.    [provider]  amoxicillin (AMOXIL) 875 MG tablet Take 1 tablet (875 mg total) by mouth 2 (two) times daily. 11/28/17   Fisher, Linden Dolin, PA-C  benzonatate (TESSALON) 100 MG capsule Take 100 mg by mouth 3 (three) times daily as needed for cough.    [provider]  brompheniramine-pseudoephedrine-DM 30-2-10 MG/5ML syrup Take 10 mLs by mouth 4 (four) times daily as needed. 10/24/17   Cuthriell, Charline Bills, PA-C  buPROPion (WELLBUTRIN SR) 100 MG 12 hr tablet Take 100 mg by mouth 2 (two) times daily.    [provider]  busPIRone (BUSPAR) 30 MG tablet Take 30 mg by mouth 2 (two) times daily.    [provider]  cetirizine (ZYRTEC) 10 MG tablet Take 10 mg by mouth daily.    [provider]  cholecalciferol (VITAMIN D) 1000 units tablet Take 1,000 Units by mouth daily.    [provider]  citalopram (CELEXA) 20 MG tablet Take 20 mg by mouth daily.    [provider]  clopidogrel (PLAVIX) 75 MG tablet Take 75 mg by mouth daily.    [provider]  dexlansoprazole (DEXILANT) 60 MG capsule Take 60 mg by mouth daily.    [provider]  enalapril (VASOTEC) 20 MG tablet Take 20 mg daily by mouth.    [provider]  fluticasone (FLONASE) 50 MCG/ACT nasal spray Place 1 spray into both nostrils daily.    [provider]  Fluticasone-Salmeterol (ADVAIR) 250-50 MCG/DOSE AEPB Inhale 1 puff into the lungs 2 (two) times daily.    [provider]  gabapentin (NEURONTIN) 400 MG capsule Take 400 mg by mouth 3 (three) times daily.    [provider]  ibuprofen (ADVIL,MOTRIN) 600 MG tablet Take 1 tablet (600 mg total) by mouth every 6 (six) hours as needed. 01/23/17   Triplett, Dessa Phi, FNP  linaclotide (LINZESS) 145 MCG CAPS capsule Take 145 mcg by mouth daily before  breakfast.    [provider]  LORazepam (ATIVAN) 1 MG tablet Take 1 tablet (1 mg total) by mouth every 8 (eight) hours as needed for anxiety. 02/03/16   Paulette Blanch, MD  losartan (COZAAR) 100 MG tablet Take 100 mg by mouth daily.    [provider]  medroxyPROGESTERone (DEPO-PROVERA) 150 MG/ML injection Inject 150 mg into the muscle every 3 (three) months.    [provider]  meloxicam (MOBIC) 15 MG tablet Take 1 tablet (15 mg total) by mouth daily. 10/24/17   Cuthriell, Charline Bills, PA-C  metFORMIN (GLUCOPHAGE) 500 MG tablet Take 500 mg by mouth 2 (two) times daily with a meal.    [provider]  montelukast (SINGULAIR) 10 MG tablet Take 10 mg at bedtime by mouth.    [provider]  omega-3 acid ethyl esters (LOVAZA) 1 g capsule Take 1 g 2 (two) times daily by mouth.    [provider]  ondansetron (ZOFRAN) 4 MG tablet Take 1 tablet (4 mg total) by mouth daily as needed for nausea or vomiting. 09/18/17 09/18/18  [name], MD  ondansetron (ZOFRAN) 8 MG tablet Take 8 mg by mouth every 8 (eight) hours as needed for nausea or vomiting.    [provider]  polyethylene glycol (MIRALAX / GLYCOLAX) packet Take 17 g by mouth every other day.    [provider]  propranolol (INDERAL) 40 MG tablet Take 20-40 mg by mouth 2 (two) times daily. Take 40 mg in the am and 20 mg at bedtime    [provider]  sennosides-docusate sodium (SENOKOT-S) 8.6-50 MG tablet Take 1 tablet by mouth daily.    [provider]  simvastatin (ZOCOR) 20 MG tablet Take 20 mg by mouth daily.    [provider]  vortioxetine HBr (TRINTELLIX) 10 MG TABS Take 10 mg daily by mouth.    [provider]    Allergies Patient has no known allergies.  Family History  Problem Relation Age of Onset  . Breast cancer Mother 26       pre pt.     Social History Social History   Tobacco Use  . Smoking status: Never Smoker    . Smokeless tobacco: Never Used  Substance Use Topics  . Alcohol use: Yes    Comment: OCCASSIONALLY  . Drug use: No    Review of Systems  Constitutional: Positive fever/chills Eyes: No visual changes. ENT:  positive sore throat. Respiratory: Denies cough, positive wheezing Genitourinary: Negative for dysuria. Musculoskeletal: Negative for back pain. Skin: Negative for rash.    ____________________________________________   PHYSICAL EXAM:  VITAL SIGNS: ED Triage Vitals  Enc Vitals Group     BP 11/28/17 2030 (!) 149/88     Pulse Rate 11/28/17 2030 80     Resp 11/28/17 2030 18     Temp 11/28/17 2030 99 F (37.2  C)     Temp Source 11/28/17 2030 Oral     SpO2 11/28/17 2030 97 %     Weight 11/28/17 2031 174 lb (78.9 kg)     Height 11/28/17 2031 5\' 2"  (1.575 m)     Head Circumference --      Peak Flow --      Pain Score 11/28/17 2030 10     Pain Loc --      Pain Edu? --      Excl. in [place]? --     Constitutional: Alert and oriented. Well appearing and in no acute distress. Eyes: Conjunctivae are normal.  Head: Atraumatic. Nose: No congestion/rhinnorhea. Mouth/Throat: Mucous membranes are moist.  Throat is red Cardiovascular: Normal rate, regular rhythm.  Heart sounds are normal Respiratory: Normal respiratory effort.  No retractions, lungs are clear to auscultation GU: deferred Musculoskeletal: FROM all extremities, warm and well perfused Neurologic:  Normal speech and language.  Skin:  Skin is warm, dry and intact. No rash noted. Psychiatric: Mood and affect are normal. Speech and behavior are normal.  ____________________________________________   LABS (all labs ordered are listed, but only abnormal results are displayed)  Labs Reviewed  GROUP A STREP BY PCR  INFLUENZA PANEL BY PCR (TYPE A & B)    ____________________________________________   ____________________________________________  RADIOLOGY    ____________________________________________   PROCEDURES  Procedure(s) performed: Strep and flu test      ____________________________________________   INITIAL IMPRESSION / ASSESSMENT AND PLAN / ED COURSE  Pertinent labs & imaging results that were available during my care of the patient were reviewed by me and considered in my medical decision making (see chart for details).  Patient is a 51 year old female that resides in a group home, she was dropped off by her stepfather, she has complaints of headache, sore throat and nausea, and fever, she was seen by her primary care provider told she had a sinus infection but was not given any medication, physical exam the throat is red she does have a low-grade fever but is otherwise stable, strep test and flu test are ordered    ----------------------------------------- 10:41 PM on 11/28/2017 -----------------------------------------  Patient's flu test and strep test are negative, patient was given a dose of amoxicillin 500 mg p.o., she is to follow-up with her regular doctor if not better in 3-5 days, she was discharged with a prescription of Amoxil 875 twice daily for 10 days, she is to take Tylenol or ibuprofen as needed for fever, she is to return to the emergency department if she is worsening   ____________________________________________   FINAL CLINICAL IMPRESSION(S) / ED DIAGNOSES  Final diagnoses:  Acute URI      NEW MEDICATIONS STARTED DURING THIS VISIT:  This SmartLink is deprecated. Use AVSMEDLIST instead to display the medication list for a patient.   Note:  This document was prepared using Dragon voice recognition software and may include unintentional dictation errors.    Versie Starks, PA-C 11/28/17 2242    [name], MD 11/28/17 [phone]

## 2017-11-28 NOTE — ED Triage Notes (Signed)
Pt arrived to the ED accompanied by her stepfather for complaints of headache, sore throat and nausea. Pt reports that she was seen by her primary and was diagnosed with a sinus infection. Pt is a resident of [employer]. Pt appears to be developmentally delayed and is a poor historian. Pt is AOx4 in no apparent distress.

## 2017-12-12 ENCOUNTER — Other Ambulatory Visit: Payer: Self-pay | Admitting: Nurse Practitioner

## 2017-12-12 DIAGNOSIS — N63 Unspecified lump in unspecified breast: Secondary | ICD-10-CM

## 2018-01-26 ENCOUNTER — Other Ambulatory Visit: Payer: Self-pay

## 2018-01-26 ENCOUNTER — Emergency Department
Admission: EM | Admit: 2018-01-26 | Discharge: 2018-01-27 | Disposition: A | Discharge status: Home / Self Care | Payer: Medicare Other | Attending: Emergency Medicine | Admitting: Emergency Medicine

## 2018-01-26 ENCOUNTER — Emergency Department: Payer: Medicare Other

## 2018-01-26 DIAGNOSIS — Z7984 Long term (current) use of oral hypoglycemic drugs: Secondary | ICD-10-CM | POA: Diagnosis not present

## 2018-01-26 DIAGNOSIS — Y929 Unspecified place or not applicable: Secondary | ICD-10-CM | POA: Diagnosis not present

## 2018-01-26 DIAGNOSIS — I10 Essential (primary) hypertension: Secondary | ICD-10-CM | POA: Diagnosis not present

## 2018-01-26 DIAGNOSIS — S0990XA Unspecified injury of head, initial encounter: Secondary | ICD-10-CM | POA: Insufficient documentation

## 2018-01-26 DIAGNOSIS — F329 Major depressive disorder, single episode, unspecified: Secondary | ICD-10-CM | POA: Insufficient documentation

## 2018-01-26 DIAGNOSIS — F0781 Postconcussional syndrome: Secondary | ICD-10-CM | POA: Diagnosis not present

## 2018-01-26 DIAGNOSIS — Y9367 Activity, basketball: Secondary | ICD-10-CM | POA: Insufficient documentation

## 2018-01-26 DIAGNOSIS — Z79899 Other long term (current) drug therapy: Secondary | ICD-10-CM | POA: Insufficient documentation

## 2018-01-26 DIAGNOSIS — W0110XA Fall on same level from slipping, tripping and stumbling with subsequent striking against unspecified object, initial encounter: Secondary | ICD-10-CM | POA: Insufficient documentation

## 2018-01-26 DIAGNOSIS — E119 Type 2 diabetes mellitus without complications: Secondary | ICD-10-CM | POA: Diagnosis not present

## 2018-01-26 DIAGNOSIS — Y998 Other external cause status: Secondary | ICD-10-CM | POA: Diagnosis not present

## 2018-01-26 DIAGNOSIS — F419 Anxiety disorder, unspecified: Secondary | ICD-10-CM | POA: Insufficient documentation

## 2018-01-26 NOTE — ED Triage Notes (Signed)
Patient reports she fell and hit her head, denies loss of consciousness.  Reports history of concussion in the past.

## 2018-01-27 DIAGNOSIS — S0990XA Unspecified injury of head, initial encounter: Secondary | ICD-10-CM | POA: Diagnosis not present

## 2018-01-27 MED ORDER — IBUPROFEN 600 MG PO TABS
600.0000 mg | ORAL_TABLET | Freq: Once | ORAL | Status: AC
  Administered 2018-01-27: 600 mg via ORAL
  Filled 2018-01-27: qty 1

## 2018-01-27 NOTE — ED Provider Notes (Signed)
[facility] Emergency Department Provider Note   ____________________________________________   First MD Initiated Contact with Patient 01/26/18 2348     (approximate)  I have reviewed the triage vital signs and the nursing notes.   HISTORY  Chief Complaint Head Injury    HPI Hannah Clarke is a 52 y.o. female who comes into the hospital today with a head injury.  The patient states that she fell on the back of her head at basketball.  The patient states that she was playing and someone pushed her and she fell backwards.  The patient did not pass out but she states that she did have a headache.  She was given some Tylenol at the game but states that she started feeling dizzy at home.  She has a headache currently that she rates a 7 out of 10 in intensity.  She is also had some nausea with no vomiting.  The patient has had a concussion in the past.  [place]  The patient is here today for evaluation of her symptoms.  Past Medical History:  Diagnosis Date  . Anxiety   . Chronic headaches   . Depression   . Dermatophytosis of nail   . Diabetes mellitus without complication ([place])   . GERD (gastroesophageal reflux disease)   . High cholesterol   . Hypertension   . Mental developmental delay   . Onychia of toe   . Panic attacks     Patient Active Problem List   Diagnosis Date Noted  . Unstable angina ([place]) 08/17/2017    Past Surgical History:  Procedure Laterality Date  . COLONOSCOPY WITH PROPOFOL N/A 10/09/2017   Procedure: COLONOSCOPY WITH PROPOFOL;  Surgeon: Toledo, Benay Pike, MD;  Location: [facility] ENDOSCOPY;  Service: Gastroenterology;  Laterality: N/A;  . ESOPHAGOGASTRODUODENOSCOPY (EGD) WITH PROPOFOL N/A 10/09/2017   Procedure: ESOPHAGOGASTRODUODENOSCOPY (EGD) WITH PROPOFOL;  Surgeon: Toledo, Benay Pike, MD;  Location: [facility] ENDOSCOPY;  Service: Gastroenterology;  Laterality: N/A;  . HAND SURGERY    . KIDNEY STONE  SURGERY      Prior to Admission medications   Medication Sig Start Date End Date Taking? Authorizing Provider  acetaminophen (TYLENOL) 325 MG tablet Take 325 mg by mouth every 6 (six) hours as needed.    [provider]  albuterol (PROVENTIL HFA;VENTOLIN HFA) 108 (90 BASE) MCG/ACT inhaler Inhale 2 puffs into the lungs every 6 (six) hours as needed for wheezing or shortness of breath. 11/07/15   Orbie Pyo, MD  amLODipine (NORVASC) 5 MG tablet Take 5 mg by mouth daily.    [provider]  amoxicillin (AMOXIL) 875 MG tablet Take 1 tablet (875 mg total) by mouth 2 (two) times daily. 11/28/17   Fisher, Linden Dolin, PA-C  benzonatate (TESSALON) 100 MG capsule Take 100 mg by mouth 3 (three) times daily as needed for cough.    [provider]  brompheniramine-pseudoephedrine-DM 30-2-10 MG/5ML syrup Take 10 mLs by mouth 4 (four) times daily as needed. 10/24/17   Cuthriell, Charline Bills, PA-C  buPROPion (WELLBUTRIN SR) 100 MG 12 hr tablet Take 100 mg by mouth 2 (two) times daily.    [provider]  busPIRone (BUSPAR) 30 MG tablet Take 30 mg by mouth 2 (two) times daily.    [provider]  cetirizine (ZYRTEC) 10 MG tablet Take 10 mg by mouth daily.    [provider]  cholecalciferol (VITAMIN D) 1000 units tablet Take 1,000 Units  by mouth daily.    [provider]  citalopram (CELEXA) 20 MG tablet Take 20 mg by mouth daily.    [provider]  clopidogrel (PLAVIX) 75 MG tablet Take 75 mg by mouth daily.    [provider]  dexlansoprazole (DEXILANT) 60 MG capsule Take 60 mg by mouth daily.    [provider]  enalapril (VASOTEC) 20 MG tablet Take 20 mg daily by mouth.    [provider]  fluticasone (FLONASE) 50 MCG/ACT nasal spray Place 1 spray into both nostrils daily.    [provider]  Fluticasone-Salmeterol (ADVAIR) 250-50 MCG/DOSE AEPB Inhale 1 puff into the lungs 2 (two) times daily.     [provider]  gabapentin (NEURONTIN) 400 MG capsule Take 400 mg by mouth 3 (three) times daily.    [provider]  ibuprofen (ADVIL,MOTRIN) 600 MG tablet Take 1 tablet (600 mg total) by mouth every 6 (six) hours as needed. 01/23/17   Triplett, Dessa Phi, FNP  linaclotide (LINZESS) 145 MCG CAPS capsule Take 145 mcg by mouth daily before breakfast.    [provider]  LORazepam (ATIVAN) 1 MG tablet Take 1 tablet (1 mg total) by mouth every 8 (eight) hours as needed for anxiety. 02/03/16   Paulette Blanch, MD  losartan (COZAAR) 100 MG tablet Take 100 mg by mouth daily.    [provider]  medroxyPROGESTERone (DEPO-PROVERA) 150 MG/ML injection Inject 150 mg into the muscle every 3 (three) months.    [provider]  meloxicam (MOBIC) 15 MG tablet Take 1 tablet (15 mg total) by mouth daily. 10/24/17   Cuthriell, Charline Bills, PA-C  metFORMIN (GLUCOPHAGE) 500 MG tablet Take 500 mg by mouth 2 (two) times daily with a meal.    [provider]  montelukast (SINGULAIR) 10 MG tablet Take 10 mg at bedtime by mouth.    [provider]  omega-3 acid ethyl esters (LOVAZA) 1 g capsule Take 1 g 2 (two) times daily by mouth.    [provider]  ondansetron (ZOFRAN) 4 MG tablet Take 1 tablet (4 mg total) by mouth daily as needed for nausea or vomiting. 09/18/17 09/18/18  [name], MD  ondansetron (ZOFRAN) 8 MG tablet Take 8 mg by mouth every 8 (eight) hours as needed for nausea or vomiting.    [provider]  polyethylene glycol (MIRALAX / GLYCOLAX) packet Take 17 g by mouth every other day.    [provider]  propranolol (INDERAL) 40 MG tablet Take 20-40 mg by mouth 2 (two) times daily. Take 40 mg in the am and 20 mg at bedtime    [provider]  sennosides-docusate sodium (SENOKOT-S) 8.6-50 MG tablet Take 1 tablet by mouth daily.    [provider]  simvastatin (ZOCOR) 20 MG tablet Take 20 mg by mouth  daily.    [provider]  vortioxetine HBr (TRINTELLIX) 10 MG TABS Take 10 mg daily by mouth.    [provider]    Allergies Patient has no known allergies.  Family History  Problem Relation Age of Onset  . Breast cancer Mother 86       pre pt.     Social History Social History   Tobacco Use  . Smoking status: Never Smoker  . Smokeless tobacco: Never Used  Substance Use Topics  . Alcohol use: Yes    Comment: OCCASSIONALLY  . Drug use: No    Review of Systems  Constitutional: No fever/chills  Eyes: No visual changes. ENT: No sore throat. Cardiovascular: Denies chest pain. Respiratory: Denies shortness of breath. Gastrointestinal: Nausea with no abdominal pain.   no vomiting.  No diarrhea.  No constipation. Genitourinary: Negative for dysuria. Musculoskeletal: Negative for back pain. Skin: Negative for rash. Neurological: Headache and dizziness   ____________________________________________   PHYSICAL EXAM:  VITAL SIGNS: ED Triage Vitals  Enc Vitals Group     BP 01/26/18 2152 (!) 135/99     Pulse Rate 01/26/18 2152 80     Resp 01/26/18 2152 18     Temp 01/26/18 2152 99.4 F (37.4 C)     Temp Source 01/26/18 2152 Oral     SpO2 01/26/18 2152 95 %     Weight 01/26/18 2153 170 lb (77.1 kg)     Height 01/26/18 2153 5\' 2"  (1.575 m)     Head Circumference --      Peak Flow --      Pain Score --      Pain Loc --      Pain Edu? --      Excl. in [place]? --     Constitutional: Alert and oriented. Well appearing and in mild distress. Eyes: Conjunctivae are normal. PERRL. EOMI. Head: Atraumatic. Nose: No congestion/rhinnorhea. Mouth/Throat: Mucous membranes are moist.  Oropharynx non-erythematous. Neck: No cervical spine tenderness to palpation. Cardiovascular: Normal rate, regular rhythm. Grossly normal heart sounds.  Good peripheral circulation. Respiratory: Normal respiratory effort.  No retractions. Lungs CTAB. Gastrointestinal: Soft and  nontender. No distention.  Positive bowel sounds Musculoskeletal: No lower extremity tenderness nor edema.   Neurologic:  Normal speech and language.  Cranial nerves II through XII are grossly intact with no focal motor neuro deficit Skin:  Skin is warm, dry and intact.  Psychiatric: Mood and affect are normal.   ____________________________________________   LABS (all labs ordered are listed, but only abnormal results are displayed)  Labs Reviewed - No data to display ____________________________________________  EKG  none ____________________________________________  RADIOLOGY  ED MD interpretation:  CT head: No acute intracranial abnormality.  Official radiology report(s): Ct Head Wo Contrast  Result Date: 01/27/2018 CLINICAL DATA:  52 year old female with acute head injury from fall today. Initial encounter. EXAM: CT HEAD WITHOUT CONTRAST TECHNIQUE: Contiguous axial images were obtained from the base of the skull through the vertex without intravenous contrast. COMPARISON:  09/18/2017 and prior CTs FINDINGS: Brain: No evidence of acute infarction, hemorrhage, hydrocephalus, extra-axial collection or mass lesion/mass effect. Mild cerebral volume loss again noted. Vascular: No hyperdense vessel or unexpected calcification. Skull: Normal. Negative for fracture or focal lesion. Sinuses/Orbits: No acute finding. Other: None IMPRESSION: No evidence of acute intracranial abnormality. Electronically Signed   By: Margarette Canada M.D.   On: 01/27/2018 00:02    ____________________________________________   PROCEDURES  Procedure(s) performed: None  Procedures  Critical Care performed: No  ____________________________________________   INITIAL IMPRESSION / ASSESSMENT AND PLAN / ED COURSE  As part of my medical decision making, I reviewed the following data within the electronic MEDICAL RECORD NUMBER Notes from prior ED visits and [place] Controlled Substance Database   This is a  52 year old female who comes into the hospital today with a head injury.  The patient fell and hit her head while playing basketball.  My differential diagnosis includes concussion, skull fracture, intracranial hemorrhage.  The patient had a CT scan of her head which did not show any acute abnormality.  I will give the patient a dose of ibuprofen and she  will be discharged home to follow-up with her primary care physician.  Given the patient's symptoms it is likely that she does have some postconcussive symptoms but she needs to not return to play until her symptoms have resolved.  The patient will be discharged home.  She did not have any pain in her neck so I did not do any further imaging studies.      ____________________________________________   FINAL CLINICAL IMPRESSION(S) / ED DIAGNOSES  Final diagnoses:  Injury of head, initial encounter  Post concussive syndrome     ED Discharge Orders    None       Note:  This document was prepared using Dragon voice recognition software and may include unintentional dictation errors.    Loney Hering, MD 01/27/18 [phone]

## 2018-01-27 NOTE — Discharge Instructions (Signed)
Please follow up with your primary care physician for further evaluation of your injury. Please do not return to basketball until your symptoms have resolved and you have been cleared by your physician.

## 2018-01-30 ENCOUNTER — Other Ambulatory Visit: Payer: Self-pay | Admitting: Nurse Practitioner

## 2018-01-30 DIAGNOSIS — Z09 Encounter for follow-up examination after completed treatment for conditions other than malignant neoplasm: Secondary | ICD-10-CM

## 2018-02-06 ENCOUNTER — Ambulatory Visit: Payer: Medicare Other | Admitting: Certified Registered Nurse Anesthetist

## 2018-02-06 ENCOUNTER — Ambulatory Visit
Admission: RE | Admit: 2018-02-06 | Discharge: 2018-02-06 | Disposition: A | Discharge status: Home / Self Care | Payer: Medicare Other | Source: Ambulatory Visit | Attending: Internal Medicine | Admitting: Internal Medicine

## 2018-02-06 ENCOUNTER — Encounter: Payer: Self-pay | Admitting: *Deleted

## 2018-02-06 ENCOUNTER — Encounter
Admission: RE | Disposition: A | Discharge status: Home / Self Care | Payer: Self-pay | Source: Ambulatory Visit | Attending: Internal Medicine

## 2018-02-06 DIAGNOSIS — I10 Essential (primary) hypertension: Secondary | ICD-10-CM | POA: Insufficient documentation

## 2018-02-06 DIAGNOSIS — R51 Headache: Secondary | ICD-10-CM | POA: Insufficient documentation

## 2018-02-06 DIAGNOSIS — E7800 Pure hypercholesterolemia, unspecified: Secondary | ICD-10-CM | POA: Diagnosis not present

## 2018-02-06 DIAGNOSIS — Z79899 Other long term (current) drug therapy: Secondary | ICD-10-CM | POA: Insufficient documentation

## 2018-02-06 DIAGNOSIS — E119 Type 2 diabetes mellitus without complications: Secondary | ICD-10-CM | POA: Insufficient documentation

## 2018-02-06 DIAGNOSIS — F819 Developmental disorder of scholastic skills, unspecified: Secondary | ICD-10-CM | POA: Insufficient documentation

## 2018-02-06 DIAGNOSIS — Z7951 Long term (current) use of inhaled steroids: Secondary | ICD-10-CM | POA: Diagnosis not present

## 2018-02-06 DIAGNOSIS — K640 First degree hemorrhoids: Secondary | ICD-10-CM | POA: Insufficient documentation

## 2018-02-06 DIAGNOSIS — F410 Panic disorder [episodic paroxysmal anxiety] without agoraphobia: Secondary | ICD-10-CM | POA: Diagnosis not present

## 2018-02-06 DIAGNOSIS — F419 Anxiety disorder, unspecified: Secondary | ICD-10-CM | POA: Insufficient documentation

## 2018-02-06 DIAGNOSIS — L03039 Cellulitis of unspecified toe: Secondary | ICD-10-CM | POA: Insufficient documentation

## 2018-02-06 DIAGNOSIS — K5730 Diverticulosis of large intestine without perforation or abscess without bleeding: Secondary | ICD-10-CM | POA: Insufficient documentation

## 2018-02-06 DIAGNOSIS — F329 Major depressive disorder, single episode, unspecified: Secondary | ICD-10-CM | POA: Diagnosis not present

## 2018-02-06 DIAGNOSIS — Z1211 Encounter for screening for malignant neoplasm of colon: Secondary | ICD-10-CM | POA: Insufficient documentation

## 2018-02-06 DIAGNOSIS — Z7984 Long term (current) use of oral hypoglycemic drugs: Secondary | ICD-10-CM | POA: Insufficient documentation

## 2018-02-06 HISTORY — PX: COLONOSCOPY WITH PROPOFOL: SHX5780

## 2018-02-06 LAB — GLUCOSE, CAPILLARY: GLUCOSE-CAPILLARY: 119 mg/dL — AB (ref 65–99)

## 2018-02-06 LAB — POCT PREGNANCY, URINE: Preg Test, Ur: NEGATIVE

## 2018-02-06 SURGERY — COLONOSCOPY WITH PROPOFOL
Anesthesia: General

## 2018-02-06 MED ORDER — SODIUM CHLORIDE 0.9 % IV SOLN
INTRAVENOUS | Status: DC
  Administered 2018-02-06: 10:00:00 via INTRAVENOUS

## 2018-02-06 MED ORDER — PROPOFOL 500 MG/50ML IV EMUL
INTRAVENOUS | Status: DC | PRN
  Administered 2018-02-06: 175 ug/kg/min via INTRAVENOUS

## 2018-02-06 MED ORDER — PROPOFOL 500 MG/50ML IV EMUL
INTRAVENOUS | Status: AC
  Filled 2018-02-06: qty 50

## 2018-02-06 MED ORDER — LIDOCAINE HCL (PF) 2 % IJ SOLN
INTRAMUSCULAR | Status: AC
  Filled 2018-02-06: qty 10

## 2018-02-06 MED ORDER — LIDOCAINE HCL (CARDIAC) 20 MG/ML IV SOLN
INTRAVENOUS | Status: DC | PRN
  Administered 2018-02-06: 50 mg via INTRAVENOUS

## 2018-02-06 MED ORDER — PROPOFOL 10 MG/ML IV BOLUS
INTRAVENOUS | Status: DC | PRN
  Administered 2018-02-06: 50 mg via INTRAVENOUS

## 2018-02-06 NOTE — Transfer of Care (Signed)
Immediate Anesthesia Transfer of Care Note  Patient: Hannah Clarke  Procedure(s) Performed: COLONOSCOPY WITH PROPOFOL (N/A )  Patient Location: PACU  Anesthesia Type:General  Level of Consciousness: awake  Airway & Oxygen Therapy: Patient Spontanous Breathing  Post-op Assessment: Report given to RN and Post -op Vital signs reviewed and stable  Post vital signs: Reviewed and stable  Last Vitals:  Vitals:   02/06/18 0908 02/06/18 1026  BP: 118/86 97/63  Pulse: 62 70  Resp: 16 17  Temp: (!) 36.3 C 36.5 C  SpO2: 100% 100%    Last Pain:  Vitals:   02/06/18 1026  TempSrc: Tympanic         Complications: No apparent anesthesia complications

## 2018-02-06 NOTE — Anesthesia Post-op Follow-up Note (Signed)
Anesthesia QCDR form completed.        

## 2018-02-06 NOTE — Anesthesia Postprocedure Evaluation (Signed)
Anesthesia Post Note  Patient: Hannah Clarke  Procedure(s) Performed: COLONOSCOPY WITH PROPOFOL (N/A )  Patient location during evaluation: Endoscopy Anesthesia Type: General Level of consciousness: awake and alert Pain management: pain level controlled Vital Signs Assessment: post-procedure vital signs reviewed and stable Respiratory status: spontaneous breathing, nonlabored ventilation, respiratory function stable and patient connected to nasal cannula oxygen Cardiovascular status: blood pressure returned to baseline and stable Postop Assessment: no apparent nausea or vomiting Anesthetic complications: no     Last Vitals:  Vitals:   02/06/18 1046 02/06/18 1056  BP: (!) 113/101 110/88  Pulse: 71 70  Resp: 18   Temp:    SpO2: 98% 99%    Last Pain:  Vitals:   02/06/18 1026  TempSrc: Tympanic                 Precious Haws Lowella Kindley

## 2018-02-06 NOTE — Anesthesia Procedure Notes (Signed)
Date/Time: 02/06/2018 9:55 AM Performed by: Johnna Acosta, CRNA Pre-anesthesia Checklist: Patient identified, Emergency Drugs available, Suction available, Patient being monitored and Timeout performed Patient Re-evaluated:Patient Re-evaluated prior to induction Oxygen Delivery Method: Nasal cannula Preoxygenation: Pre-oxygenation with 100% oxygen

## 2018-02-06 NOTE — Anesthesia Preprocedure Evaluation (Signed)
Anesthesia Evaluation  Patient identified by MRN, date of birth, ID band Patient awake    Reviewed: Allergy & Precautions, H&P , NPO status , Patient's Chart, lab work & pertinent test results  History of Anesthesia Complications Negative for: history of anesthetic complications  Airway Mallampati: III  TM Distance: <3 FB Neck ROM: limited    Dental  (+) Chipped, Poor Dentition, Missing   Pulmonary sleep apnea and Continuous Positive Airway Pressure Ventilation ,           Cardiovascular Exercise Tolerance: Good hypertension, (-) angina(-) DOE      Neuro/Psych  Headaches, PSYCHIATRIC DISORDERS Anxiety Depression    GI/Hepatic negative GI ROS, Neg liver ROS, GERD  ,  Endo/Other  diabetes, Type 2  Renal/GU negative Renal ROS  negative genitourinary   Musculoskeletal   Abdominal   Peds  Hematology negative hematology ROS (+)   Anesthesia Other Findings Past Medical History: No date: Anxiety No date: Chronic headaches No date: Depression No date: Dermatophytosis of nail No date: Diabetes mellitus without complication (HCC) No date: GERD (gastroesophageal reflux disease) No date: High cholesterol No date: Hypertension No date: Mental developmental delay No date: Onychia of toe No date: Panic attacks  Past Surgical History: 10/09/2017: COLONOSCOPY WITH PROPOFOL; N/A     Comment:  Procedure: COLONOSCOPY WITH PROPOFOL;  Surgeon: Toledo,               Benay Pike, MD;  Location: [facility] ENDOSCOPY;  Service:               Gastroenterology;  Laterality: N/A; 10/09/2017: ESOPHAGOGASTRODUODENOSCOPY (EGD) WITH PROPOFOL; N/A     Comment:  Procedure: ESOPHAGOGASTRODUODENOSCOPY (EGD) WITH               PROPOFOL;  Surgeon: Toledo, Benay Pike, MD;  Location:               [facility] ENDOSCOPY;  Service: Gastroenterology;  Laterality:               N/A; No date: HAND SURGERY No date: KIDNEY STONE SURGERY  BMI    Body Mass Index:   31.09 kg/m      Reproductive/Obstetrics negative OB ROS                             Anesthesia Physical Anesthesia Plan  ASA: III  Anesthesia Plan: General   Post-op Pain Management:    Induction: Intravenous  PONV Risk Score and Plan: Propofol infusion  Airway Management Planned: Natural Airway and Nasal Cannula  Additional Equipment:   Intra-op Plan:   Post-operative Plan:   Informed Consent: I have reviewed the patients History and Physical, chart, labs and discussed the procedure including the risks, benefits and alternatives for the proposed anesthesia with the patient or authorized representative who has indicated his/her understanding and acceptance.   Dental Advisory Given  Plan Discussed with: Anesthesiologist, CRNA and Surgeon  Anesthesia Plan Comments: (Patient and caregiver consented for risks of anesthesia including but not limited to:  - adverse reactions to medications - risk of intubation if required - damage to teeth, lips or other oral mucosa - sore throat or hoarseness - Damage to heart, brain, lungs or loss of life  They voiced understanding.)        Anesthesia Quick Evaluation

## 2018-02-06 NOTE — Interval H&P Note (Signed)
History and Physical Interval Note:  02/06/2018 9:54 AM  Hannah Clarke  has presented today for surgery, with the diagnosis of SCREENING  The various methods of treatment have been discussed with the patient and family. After consideration of risks, benefits and other options for treatment, the patient has consented to  Procedure(s): COLONOSCOPY WITH PROPOFOL (N/A) as a surgical intervention .  The patient's history has been reviewed, patient examined, no change in status, stable for surgery.  I have reviewed the patient's chart and labs.  Questions were answered to the patient's satisfaction.     [place], [place]

## 2018-02-06 NOTE — Op Note (Signed)
[facility] Gastroenterology Patient Name: Hannah Clarke Procedure Date: 02/06/2018 9:41 AM MRN: [id number] Account #: [id number] Date of Birth: 1966-06-04 Admit Type: Outpatient Age: 52 Room: [facility] ENDO ROOM 4 Gender: Female Note Status: Finalized Procedure:            Colonoscopy Indications:          Screening for colorectal malignant neoplasm Providers:            Benay Pike. Alice Reichert MD, MD Referring MD:         Maryellen Pile (Referring MD) Medicines:            Propofol per Anesthesia Complications:        No immediate complications. Procedure:            Pre-Anesthesia Assessment:                       - The risks and benefits of the procedure and the                        sedation options and risks were discussed with the                        patient. All questions were answered and informed                        consent was obtained.                       - Patient identification and proposed procedure were                        verified prior to the procedure by the nurse. The                        procedure was verified in the procedure room.                       - ASA Grade Assessment: II - A patient with mild                        systemic disease.                       - After reviewing the risks and benefits, the patient                        was deemed in satisfactory condition to undergo the                        procedure.                       After obtaining informed consent, the colonoscope was                        passed under direct vision. Throughout the procedure,                        the patient's blood pressure, pulse, and oxygen  saturations were monitored continuously. The                        Colonoscope was introduced through the anus and                        advanced to the the cecum, identified by appendiceal                        orifice and ileocecal valve. The colonoscopy was           performed without difficulty. The patient tolerated the                        procedure well. The quality of the bowel preparation                        was fair. A portion of the colon was not well                        visualized. Specifically, the sigmoid colon was not                        adequately visualized. Findings:      The perianal and digital rectal examinations were normal. Pertinent       negatives include normal sphincter tone and no palpable rectal lesions.      A few small-mouthed diverticula were found in the sigmoid colon. There       was no evidence of diverticular bleeding.      Non-bleeding internal hemorrhoids were found during retroflexion. The       hemorrhoids were Grade I (internal hemorrhoids that do not prolapse).      Semi-liquid stool was found in the sigmoid colon and in the descending       colon, making visualization difficult. Lavage of the area was performed       using a large amount of sterile water, resulting in incomplete clearance       with fair visualization.      The exam was otherwise without abnormality. Impression:           - Preparation of the colon was fair.                       - Mild diverticulosis in the sigmoid colon. There was                        no evidence of diverticular bleeding.                       - Non-bleeding internal hemorrhoids.                       - Stool in the sigmoid colon and in the descending                        colon.                       - The examination was otherwise normal.                       -  No specimens collected. Recommendation:       - Patient has a contact number available for                        emergencies. The signs and symptoms of potential                        delayed complications were discussed with the patient.                        Return to normal activities tomorrow. Written discharge                        instructions were provided to the patient.                        - Resume previous diet.                       - Continue present medications.                       - Repeat colonoscopy in 5 years for screening purposes.                       - Return to GI office PRN.                       - The findings and recommendations were discussed with                        the designated responsible adult.                       - The findings and recommendations were discussed with                        the patient. Procedure Code(s):    --- Professional ---                       K3546, Colorectal cancer screening; colonoscopy on                        individual not meeting criteria for high risk Diagnosis Code(s):    --- Professional ---                       K57.30, Diverticulosis of large intestine without                        perforation or abscess without bleeding                       K64.0, First degree hemorrhoids                       Z12.11, Encounter for screening for malignant neoplasm                        of colon CPT copyright 2016 American Medical Association. All rights reserved. The codes documented in this report are preliminary and upon coder review may  be revised to meet current compliance  requirements. Efrain Sella MD, MD 02/06/2018 10:26:33 AM This report has been signed electronically. Number of Addenda: 0 Note Initiated On: 02/06/2018 9:41 AM Scope Withdrawal Time: 0 hours 11 minutes 3 seconds  Total Procedure Duration: 0 hours 21 minutes 31 seconds       [facility]

## 2018-02-06 NOTE — H&P (Signed)
Outpatient short stay form Pre-procedure 02/06/2018 9:52 AM Hannah Clarke, M.D.  Primary Physician: Margurite Auerbach, NP  Reason for visit: Colon cancer screening  History of present illness:  Patient is a 52 y/o female with intellectual disability presenting for colon cancer screening. Patient denies change in bowel habits, abdominal pain. Has had miniscule amount of rectal bleeding.     Current Facility-Administered Medications:  .  0.9 %  sodium chloride infusion, , Intravenous, Continuous, Crabtree, Benay Pike, MD, Last Rate: 20 mL/hr at 02/06/18 5852  Medications Prior to Admission  Medication Sig Dispense Refill Last Dose  . albuterol (PROVENTIL HFA;VENTOLIN HFA) 108 (90 BASE) MCG/ACT inhaler Inhale 2 puffs into the lungs every 6 (six) hours as needed for wheezing or shortness of breath. 1 Inhaler 2 02/06/2018 at Unknown time  . amLODipine (NORVASC) 5 MG tablet Take 5 mg by mouth daily.   02/06/2018 at Unknown time  . buPROPion (WELLBUTRIN SR) 100 MG 12 hr tablet Take 100 mg by mouth 2 (two) times daily.   02/06/2018 at Unknown time  . busPIRone (BUSPAR) 30 MG tablet Take 30 mg by mouth 2 (two) times daily.   02/06/2018 at Unknown time  . citalopram (CELEXA) 20 MG tablet Take 20 mg by mouth daily.   02/06/2018 at Unknown time  . Fluticasone-Salmeterol (ADVAIR) 250-50 MCG/DOSE AEPB Inhale 1 puff into the lungs 2 (two) times daily.   02/06/2018 at Unknown time  . gabapentin (NEURONTIN) 400 MG capsule Take 400 mg by mouth 3 (three) times daily.   02/06/2018 at Unknown time  . losartan (COZAAR) 100 MG tablet Take 100 mg by mouth daily.   02/06/2018 at Unknown time  . metFORMIN (GLUCOPHAGE) 500 MG tablet Take 500 mg by mouth 2 (two) times daily with a meal.   02/05/2018 at Unknown time  . montelukast (SINGULAIR) 10 MG tablet Take 10 mg at bedtime by mouth.   02/06/2018 at Unknown time  . omega-3 acid ethyl esters (LOVAZA) 1 g capsule Take 1 g 2 (two) times daily by mouth.   Past Week at Unknown time  .  propranolol (INDERAL) 40 MG tablet Take 20-40 mg by mouth 2 (two) times daily. Take 40 mg in the am and 20 mg at bedtime   02/06/2018 at Unknown time  . simvastatin (ZOCOR) 20 MG tablet Take 20 mg by mouth daily.   02/06/2018 at Unknown time  . acetaminophen (TYLENOL) 325 MG tablet Take 325 mg by mouth every 6 (six) hours as needed.     Marland Kitchen amoxicillin (AMOXIL) 875 MG tablet Take 1 tablet (875 mg total) by mouth 2 (two) times daily. 20 tablet 0   . benzonatate (TESSALON) 100 MG capsule Take 100 mg by mouth 3 (three) times daily as needed for cough.     . brompheniramine-pseudoephedrine-DM 30-2-10 MG/5ML syrup Take 10 mLs by mouth 4 (four) times daily as needed. 200 mL 0   . cetirizine (ZYRTEC) 10 MG tablet Take 10 mg by mouth daily.   10/08/2017 at Unknown time  . cholecalciferol (VITAMIN D) 1000 units tablet Take 1,000 Units by mouth daily.   08/17/2017 at Unknown time  . dexlansoprazole (DEXILANT) 60 MG capsule Take 60 mg by mouth daily.   08/17/2017 at Unknown time  . fluticasone (FLONASE) 50 MCG/ACT nasal spray Place 1 spray into both nostrils daily.   10/09/2017 at Unknown time  . ibuprofen (ADVIL,MOTRIN) 600 MG tablet Take 1 tablet (600 mg total) by mouth every 6 (six) hours as needed. [place]  tablet 0   . linaclotide (LINZESS) 145 MCG CAPS capsule Take 145 mcg by mouth daily before breakfast.   10/08/2017 at Unknown time  . LORazepam (ATIVAN) 1 MG tablet Take 1 tablet (1 mg total) by mouth every 8 (eight) hours as needed for anxiety. 15 tablet 0 prn at prn  . medroxyPROGESTERone (DEPO-PROVERA) 150 MG/ML injection Inject 150 mg into the muscle every 3 (three) months.     . meloxicam (MOBIC) 15 MG tablet Take 1 tablet (15 mg total) by mouth daily. 30 tablet 0   . ondansetron (ZOFRAN) 4 MG tablet Take 1 tablet (4 mg total) by mouth daily as needed for nausea or vomiting. 14 tablet 0   . ondansetron (ZOFRAN) 8 MG tablet Take 8 mg by mouth every 8 (eight) hours as needed for nausea or vomiting.     . polyethylene  glycol (MIRALAX / GLYCOLAX) packet Take 17 g by mouth every other day.   Past Week at Unknown time  . sennosides-docusate sodium (SENOKOT-S) 8.6-50 MG tablet Take 1 tablet by mouth daily.   10/08/2017 at Unknown time  . vortioxetine HBr (TRINTELLIX) 10 MG TABS Take 10 mg daily by mouth.   10/08/2017 at Unknown time     No Known Allergies   Past Medical History:  Diagnosis Date  . Anxiety   . Chronic headaches   . Depression   . Dermatophytosis of nail   . Diabetes mellitus without complication ([place])   . GERD (gastroesophageal reflux disease)   . High cholesterol   . Hypertension   . Mental developmental delay   . Onychia of toe   . Panic attacks     Review of systems:   Negative.   Physical Exam  Gen: Alert, oriented. Appears stated age.  HEENT: [place]/AT. PERRLA. Lungs: CTA, no wheezes. CV: RR nl S1, S2. Abd: soft, benign, no masses. BS+ Ext: No edema. Pulses 2+    Planned procedures: Colonoscopy. The patient understands the nature of the planned procedure, indications, risks, alternatives and potential complications including but not limited to bleeding, infection, perforation, damage to internal organs and possible oversedation/side effects from anesthesia. The patient agrees and gives consent to proceed.  Please refer to procedure notes for findings, recommendations and patient disposition/instructions.    Hannah Clarke, M.D. Gastroenterology 02/06/2018  9:52 AM

## 2018-02-07 ENCOUNTER — Encounter: Payer: Self-pay | Admitting: Internal Medicine

## 2018-02-20 ENCOUNTER — Ambulatory Visit
Admission: RE | Admit: 2018-02-20 | Discharge: 2018-02-20 | Disposition: A | Discharge status: Home / Self Care | Payer: Medicare Other | Source: Ambulatory Visit | Attending: Unknown Physician Specialty | Admitting: Unknown Physician Specialty

## 2018-02-20 ENCOUNTER — Other Ambulatory Visit: Payer: Self-pay | Admitting: Unknown Physician Specialty

## 2018-02-20 DIAGNOSIS — R51 Headache: Secondary | ICD-10-CM

## 2018-02-20 DIAGNOSIS — R519 Headache, unspecified: Secondary | ICD-10-CM

## 2018-02-20 DIAGNOSIS — J329 Chronic sinusitis, unspecified: Secondary | ICD-10-CM

## 2018-04-15 ENCOUNTER — Ambulatory Visit
Admission: RE | Admit: 2018-04-15 | Discharge: 2018-04-15 | Disposition: A | Discharge status: Home / Self Care | Payer: Medicare Other | Source: Ambulatory Visit | Attending: Nurse Practitioner | Admitting: Nurse Practitioner

## 2018-04-15 DIAGNOSIS — N63 Unspecified lump in unspecified breast: Secondary | ICD-10-CM

## 2018-04-15 DIAGNOSIS — Z09 Encounter for follow-up examination after completed treatment for conditions other than malignant neoplasm: Secondary | ICD-10-CM | POA: Diagnosis not present

## 2018-04-15 DIAGNOSIS — N6310 Unspecified lump in the right breast, unspecified quadrant: Secondary | ICD-10-CM | POA: Insufficient documentation

## 2018-06-14 ENCOUNTER — Emergency Department
Admission: EM | Admit: 2018-06-14 | Discharge: 2018-06-15 | Disposition: A | Discharge status: Home / Self Care | Payer: Medicare Other | Attending: Emergency Medicine | Admitting: Emergency Medicine

## 2018-06-14 ENCOUNTER — Encounter: Payer: Self-pay | Admitting: Emergency Medicine

## 2018-06-14 ENCOUNTER — Other Ambulatory Visit: Payer: Self-pay

## 2018-06-14 ENCOUNTER — Emergency Department: Payer: Medicare Other

## 2018-06-14 DIAGNOSIS — I200 Unstable angina: Secondary | ICD-10-CM | POA: Diagnosis not present

## 2018-06-14 DIAGNOSIS — I10 Essential (primary) hypertension: Secondary | ICD-10-CM | POA: Diagnosis not present

## 2018-06-14 DIAGNOSIS — Z79899 Other long term (current) drug therapy: Secondary | ICD-10-CM | POA: Insufficient documentation

## 2018-06-14 DIAGNOSIS — E119 Type 2 diabetes mellitus without complications: Secondary | ICD-10-CM | POA: Diagnosis not present

## 2018-06-14 DIAGNOSIS — Z7984 Long term (current) use of oral hypoglycemic drugs: Secondary | ICD-10-CM | POA: Insufficient documentation

## 2018-06-14 DIAGNOSIS — R079 Chest pain, unspecified: Secondary | ICD-10-CM | POA: Diagnosis not present

## 2018-06-14 LAB — BASIC METABOLIC PANEL
ANION GAP: 12 (ref 5–15)
BUN: 15 mg/dL (ref 6–20)
CALCIUM: 9 mg/dL (ref 8.9–10.3)
CHLORIDE: 111 mmol/L (ref 98–111)
CO2: 21 mmol/L — AB (ref 22–32)
Creatinine, Ser: 0.59 mg/dL (ref 0.44–1.00)
GFR calc Af Amer: >60 mL/min (ref >60)
GFR calc non Af Amer: >60 mL/min (ref >60)
GLUCOSE: 199 mg/dL — AB (ref 70–99)
Potassium: 3.6 mmol/L (ref 3.5–5.1)
Sodium: 144 mmol/L (ref 135–145)

## 2018-06-14 LAB — CBC
HCT: 36.6 % (ref 35.0–47.0)
HEMOGLOBIN: 12.4 g/dL (ref 12.0–16.0)
MCH: 29.4 pg (ref 26.0–34.0)
MCHC: 34 g/dL (ref 32.0–36.0)
MCV: 86.5 fL (ref 80.0–100.0)
Platelets: 256 10*3/uL (ref 150–440)
RBC: 4.23 MIL/uL (ref 3.80–5.20)
RDW: 14.2 % (ref 11.5–14.5)
WBC: 7.1 10*3/uL (ref 3.6–11.0)

## 2018-06-14 LAB — TROPONIN I

## 2018-06-14 MED ORDER — ASPIRIN 81 MG PO CHEW
324.0000 mg | CHEWABLE_TABLET | Freq: Once | ORAL | Status: AC
  Administered 2018-06-15: 324 mg via ORAL
  Filled 2018-06-14: qty 4

## 2018-06-14 MED ORDER — ONDANSETRON 4 MG PO TBDP
4.0000 mg | ORAL_TABLET | Freq: Once | ORAL | Status: AC
  Administered 2018-06-15: 4 mg via ORAL
  Filled 2018-06-14: qty 1

## 2018-06-14 MED ORDER — GI COCKTAIL ~~LOC~~
30.0000 mL | Freq: Once | ORAL | Status: AC
  Administered 2018-06-15: 30 mL via ORAL
  Filled 2018-06-14: qty 30

## 2018-06-14 NOTE — ED Triage Notes (Signed)
Pt to triage via wheelchair. Pt reports having intermittent chest pain for a couple of weeks. Pt reports shortness of breath, nausea and diaphoresis Pt reports she has "a lot of stress" and anxiety. Pt also reports she got a depo shot about 2 weeks ago. Pt lives at [place] but states she is her own legal guardian.

## 2018-06-15 DIAGNOSIS — R079 Chest pain, unspecified: Secondary | ICD-10-CM | POA: Diagnosis not present

## 2018-06-15 LAB — TROPONIN I: Troponin I: <0.03 ng/mL (ref <0.03)

## 2018-06-15 NOTE — Discharge Instructions (Signed)
Please follow-up with your primary care physician for further evaluation of your chest pain.

## 2018-06-15 NOTE — ED Notes (Signed)
Spoke with Eloisa Northern at [facility] who states pt may return to her residence at [address] in a taxi.

## 2018-06-15 NOTE — ED Provider Notes (Signed)
[facility] Emergency Department Provider Note   ____________________________________________   First MD Initiated Contact with Patient 06/14/18 2346     (approximate)  I have reviewed the triage vital signs and the nursing notes.   HISTORY  Chief Complaint Chest Pain    HPI Hannah Clarke is a 51 y.o. female who comes into the hospital today with some chest pain.  The patient states that the pain is in the middle of her chest and has been on and off for the past week.  She reports that she has been stressing out about things that people of been saying about her.  She rates her pain a 9 out of 10 in intensity.  She took some Tylenol for her pain but it did not help.  She states that she is also had some shortness of breath and feels like she is wheezing.  She has been coughing she states but only a little.  She is had some nausea and vomiting as well.  The pain does not radiate and does not change with inspiration.  She states that her chest does hurt to touch.  She was supposed to get a stress test last week but reports that she could not make it so she needs to reschedule it for this coming week.  She is here today for evaluation.   Past Medical History:  Diagnosis Date  . Anxiety   . Chronic headaches   . Depression   . Dermatophytosis of nail   . Diabetes mellitus without complication ([place])   . GERD (gastroesophageal reflux disease)   . High cholesterol   . Hypertension   . Mental developmental delay   . Onychia of toe   . Panic attacks     Patient Active Problem List   Diagnosis Date Noted  . Unstable angina ([place]) 08/17/2017    Past Surgical History:  Procedure Laterality Date  . COLONOSCOPY WITH PROPOFOL N/A 10/09/2017   Procedure: COLONOSCOPY WITH PROPOFOL;  Surgeon: Toledo, Benay Pike, MD;  Location: [facility] ENDOSCOPY;  Service: Gastroenterology;  Laterality: N/A;  . COLONOSCOPY WITH PROPOFOL N/A 02/06/2018   Procedure: COLONOSCOPY WITH  PROPOFOL;  Surgeon: Toledo, Benay Pike, MD;  Location: [facility] ENDOSCOPY;  Service: Gastroenterology;  Laterality: N/A;  . ESOPHAGOGASTRODUODENOSCOPY (EGD) WITH PROPOFOL N/A 10/09/2017   Procedure: ESOPHAGOGASTRODUODENOSCOPY (EGD) WITH PROPOFOL;  Surgeon: Toledo, Benay Pike, MD;  Location: [facility] ENDOSCOPY;  Service: Gastroenterology;  Laterality: N/A;  . HAND SURGERY    . KIDNEY STONE SURGERY      Prior to Admission medications   Medication Sig Start Date End Date Taking? Authorizing Provider  acetaminophen (TYLENOL) 325 MG tablet Take 325 mg by mouth every 6 (six) hours as needed.    [provider]  albuterol (PROVENTIL HFA;VENTOLIN HFA) 108 (90 BASE) MCG/ACT inhaler Inhale 2 puffs into the lungs every 6 (six) hours as needed for wheezing or shortness of breath. 11/07/15   Orbie Pyo, MD  amLODipine (NORVASC) 5 MG tablet Take 5 mg by mouth daily.    [provider]  amoxicillin (AMOXIL) 875 MG tablet Take 1 tablet (875 mg total) by mouth 2 (two) times daily. 11/28/17   Fisher, Linden Dolin, PA-C  benzonatate (TESSALON) 100 MG capsule Take 100 mg by mouth 3 (three) times daily as needed for cough.    [provider]  brompheniramine-pseudoephedrine-DM 30-2-10 MG/5ML syrup Take 10 mLs by mouth 4 (four) times daily as needed. 10/24/17   Cuthriell, Charline Bills, PA-C  buPROPion (WELLBUTRIN SR) 100 MG 12 hr tablet Take 100 mg by mouth 2 (two) times daily.    [provider]  busPIRone (BUSPAR) 30 MG tablet Take 30 mg by mouth 2 (two) times daily.    [provider]  cetirizine (ZYRTEC) 10 MG tablet Take 10 mg by mouth daily.    [provider]  cholecalciferol (VITAMIN D) 1000 units tablet Take 1,000 Units by mouth daily.    [provider]  citalopram (CELEXA) 20 MG tablet Take 20 mg by mouth daily.    [provider]  dexlansoprazole (DEXILANT) 60 MG capsule Take 60 mg by mouth daily.    [provider]  fluticasone  (FLONASE) 50 MCG/ACT nasal spray Place 1 spray into both nostrils daily.    [provider]  Fluticasone-Salmeterol (ADVAIR) 250-50 MCG/DOSE AEPB Inhale 1 puff into the lungs 2 (two) times daily.    [provider]  gabapentin (NEURONTIN) 400 MG capsule Take 400 mg by mouth 3 (three) times daily.    [provider]  ibuprofen (ADVIL,MOTRIN) 600 MG tablet Take 1 tablet (600 mg total) by mouth every 6 (six) hours as needed. 01/23/17   Triplett, Dessa Phi, FNP  linaclotide (LINZESS) 145 MCG CAPS capsule Take 145 mcg by mouth daily before breakfast.    [provider]  LORazepam (ATIVAN) 1 MG tablet Take 1 tablet (1 mg total) by mouth every 8 (eight) hours as needed for anxiety. 02/03/16   Paulette Blanch, MD  losartan (COZAAR) 100 MG tablet Take 100 mg by mouth daily.    [provider]  medroxyPROGESTERone (DEPO-PROVERA) 150 MG/ML injection Inject 150 mg into the muscle every 3 (three) months.    [provider]  meloxicam (MOBIC) 15 MG tablet Take 1 tablet (15 mg total) by mouth daily. 10/24/17   Cuthriell, Charline Bills, PA-C  metFORMIN (GLUCOPHAGE) 500 MG tablet Take 500 mg by mouth 2 (two) times daily with a meal.    [provider]  montelukast (SINGULAIR) 10 MG tablet Take 10 mg at bedtime by mouth.    [provider]  omega-3 acid ethyl esters (LOVAZA) 1 g capsule Take 1 g 2 (two) times daily by mouth.    [provider]  ondansetron (ZOFRAN) 4 MG tablet Take 1 tablet (4 mg total) by mouth daily as needed for nausea or vomiting. 09/18/17 09/18/18  [name], MD  ondansetron (ZOFRAN) 8 MG tablet Take 8 mg by mouth every 8 (eight) hours as needed for nausea or vomiting.    [provider]  polyethylene glycol (MIRALAX / GLYCOLAX) packet Take 17 g by mouth every other day.    [provider]  propranolol (INDERAL) 40 MG tablet Take 20-40 mg by mouth 2 (two) times daily. Take 40 mg in the am and 20 mg at  bedtime    [provider]  sennosides-docusate sodium (SENOKOT-S) 8.6-50 MG tablet Take 1 tablet by mouth daily.    [provider]  simvastatin (ZOCOR) 20 MG tablet Take 20 mg by mouth daily.    [provider]  vortioxetine HBr (TRINTELLIX) 10 MG TABS Take 10 mg daily by mouth.    [provider]    Allergies Patient has no known allergies.  Family History  Problem Relation Age of Onset  . Breast cancer Mother 30       pre pt.     Social History Social History   Tobacco Use  . Smoking status: Never  Smoker  . Smokeless tobacco: Never Used  Substance Use Topics  . Alcohol use: Yes    Comment: OCCASSIONALLY  . Drug use: No    Review of Systems  Constitutional: No fever/chills Eyes: No visual changes. ENT: No sore throat. Cardiovascular:  chest pain. Respiratory:  shortness of breath. Gastrointestinal: nausea and vomiting with No abdominal pain.  No diarrhea.  No constipation. Genitourinary: Negative for dysuria. Musculoskeletal: Negative for back pain. Skin: Negative for rash. Neurological: Negative for headaches, focal weakness or numbness.   ____________________________________________   PHYSICAL EXAM:  VITAL SIGNS: ED Triage Vitals  Enc Vitals Group     BP 06/14/18 2226 125/78     Pulse Rate 06/14/18 2226 60     Resp 06/14/18 2226 18     Temp 06/14/18 2226 98.4 F (36.9 C)     Temp Source 06/14/18 2226 Oral     SpO2 06/14/18 2226 97 %     Weight 06/14/18 2227 178 lb (80.7 kg)     Height 06/14/18 2227 5\' 2"  (1.575 m)     Head Circumference --      Peak Flow --      Pain Score 06/14/18 2226 8     Pain Loc --      Pain Edu? --      Excl. in [place]? --     Constitutional: Alert and oriented. Well appearing and in moderate distress. Eyes: Conjunctivae are normal. PERRL. EOMI. Head: Atraumatic. Nose: No congestion/rhinnorhea. Mouth/Throat: Mucous membranes are moist.  Oropharynx non-erythematous. Cardiovascular:  Normal rate, regular rhythm. Grossly normal heart sounds.  Good peripheral circulation. Respiratory: Normal respiratory effort.  No retractions. Lungs CTAB. Gastrointestinal: Soft and nontender. No distention. Positive bowel sounds Musculoskeletal: No lower extremity tenderness nor edema.   Neurologic:  Normal speech and language.  Skin:  Skin is warm, dry and intact.  Psychiatric: Mood and affect are normal.   ____________________________________________   LABS (all labs ordered are listed, but only abnormal results are displayed)  Labs Reviewed  BASIC METABOLIC PANEL - Abnormal; Notable for the following components:      Result Value   CO2 21 (*)    Glucose, Bld 199 (*)    All other components within normal limits  CBC  TROPONIN I  TROPONIN I  POC URINE PREG, ED   ____________________________________________  EKG  ED ECG REPORT I, Loney Hering, the attending physician, personally viewed and interpreted this ECG.   Date: 06/14/2018  EKG Time: 2217  Rate: 66  Rhythm: normal sinus rhythm  Axis: normal  Intervals:none  ST&T Change: none  ____________________________________________  RADIOLOGY  ED MD interpretation:  CXR: no active cardiopulmonary disease  Official radiology report(s): Dg Chest 2 View  Result Date: 06/14/2018 CLINICAL DATA:  52 year old female with chest pain. EXAM: CHEST - 2 VIEW COMPARISON:  Chest radiograph dated 11/17/2017 FINDINGS: The heart size and mediastinal contours are within normal limits. Both lungs are clear. The visualized skeletal structures are unremarkable. IMPRESSION: No active cardiopulmonary disease. Electronically Signed   By: Anner Crete M.D.   On: 06/14/2018 22:52    ____________________________________________   PROCEDURES  Procedure(s) performed: None  Procedures  Critical Care performed: No  ____________________________________________   INITIAL IMPRESSION / ASSESSMENT AND PLAN / ED COURSE  As part  of my medical decision making, I reviewed the following data within the electronic MEDICAL RECORD NUMBER Notes from prior ED visits and [place] Controlled Substance Database   This is a 52 year old female who  comes into the hospital today with some chest pain and stressors.  My differential diagnosis includes bronchitis, musculoskeletal pain, ACS  We did check some blood work on the patient to include a CBC, BMP and a troponin.  The patient's blood sugar was 199 but the remaining blood work is unremarkable.  The patient also had a chest x-ray which was negative.  We repeated the troponin and it was negative.  The patient will be discharged home to follow-up with her cardiologist as she has scheduled.  The patient did receive a dose of aspirin Zofran and a GI cocktail while she was in the hospital.      ____________________________________________   FINAL CLINICAL IMPRESSION(S) / ED DIAGNOSES  Final diagnoses:  Chest pain, unspecified type     ED Discharge Orders    None       Note:  This document was prepared using Dragon voice recognition software and may include unintentional dictation errors.    Loney Hering, MD 06/15/18 [phone]

## 2018-06-25 ENCOUNTER — Other Ambulatory Visit: Payer: Self-pay

## 2018-06-25 DIAGNOSIS — I10 Essential (primary) hypertension: Secondary | ICD-10-CM | POA: Diagnosis not present

## 2018-06-25 DIAGNOSIS — Z7984 Long term (current) use of oral hypoglycemic drugs: Secondary | ICD-10-CM | POA: Diagnosis not present

## 2018-06-25 DIAGNOSIS — E119 Type 2 diabetes mellitus without complications: Secondary | ICD-10-CM | POA: Insufficient documentation

## 2018-06-25 DIAGNOSIS — N3000 Acute cystitis without hematuria: Secondary | ICD-10-CM | POA: Diagnosis not present

## 2018-06-25 DIAGNOSIS — Z79899 Other long term (current) drug therapy: Secondary | ICD-10-CM | POA: Insufficient documentation

## 2018-06-25 DIAGNOSIS — R350 Frequency of micturition: Secondary | ICD-10-CM | POA: Diagnosis present

## 2018-06-25 LAB — POCT PREGNANCY, URINE: PREG TEST UR: NEGATIVE

## 2018-06-25 LAB — URINALYSIS, COMPLETE (UACMP) WITH MICROSCOPIC
BILIRUBIN URINE: NEGATIVE
Glucose, UA: NEGATIVE mg/dL
Ketones, ur: NEGATIVE mg/dL
Nitrite: POSITIVE — AB
PH: 5 (ref 5.0–8.0)
Protein, ur: 30 mg/dL — AB
Specific Gravity, Urine: 1.019 (ref 1.005–1.030)
WBC, UA: >50 WBC/hpf — ABNORMAL HIGH (ref 0–5)

## 2018-06-25 NOTE — ED Triage Notes (Addendum)
Pt in with co urinary frequency for about a week, states also has left hip pain. States has had left hip pain for years. Pt also states she feels sad because her boyfriend was arguing with neighbor, denies any SI or HI states made her feel anxious.

## 2018-06-25 NOTE — ED Notes (Addendum)
Spoke to Dr. Alfred Levins about patients complaints who instructed not to do any psych protocols at this time.

## 2018-06-26 ENCOUNTER — Emergency Department
Admission: EM | Admit: 2018-06-26 | Discharge: 2018-06-26 | Disposition: A | Discharge status: Home / Self Care | Payer: Medicare Other | Attending: Emergency Medicine | Admitting: Emergency Medicine

## 2018-06-26 ENCOUNTER — Emergency Department: Payer: Medicare Other

## 2018-06-26 DIAGNOSIS — N3000 Acute cystitis without hematuria: Secondary | ICD-10-CM | POA: Diagnosis not present

## 2018-06-26 MED ORDER — CEPHALEXIN 500 MG PO CAPS
500.0000 mg | ORAL_CAPSULE | Freq: Once | ORAL | Status: AC
  Administered 2018-06-26: 500 mg via ORAL
  Filled 2018-06-26: qty 1

## 2018-06-26 MED ORDER — CEPHALEXIN 500 MG PO CAPS: 500.0000 mg | ORAL_CAPSULE | Freq: Two times a day (BID) | ORAL | 0 refills | Status: AC

## 2018-06-26 NOTE — ED Provider Notes (Signed)
[facility] Emergency Department Provider Note   First MD Initiated Contact with Patient 06/26/18 0037     (approximate)  I have reviewed the triage vital signs and the nursing notes.   HISTORY  Chief Complaint Urinary Frequency   HPI Hannah Clarke is a 52 y.o. female to the emergency department with a one-week history of urinary frequency urgency and dysuria with malodorous urine.  Patient denies any fever afebrile on presentation.  Patient admits to previous UTIs in the past with symptoms consistent with today's.  Patient denies any back pain   Past Medical History:  Diagnosis Date  . Anxiety   . Chronic headaches   . Depression   . Dermatophytosis of nail   . Diabetes mellitus without complication ([place])   . GERD (gastroesophageal reflux disease)   . High cholesterol   . Hypertension   . Mental developmental delay   . Onychia of toe   . Panic attacks     Patient Active Problem List   Diagnosis Date Noted  . Unstable angina ([place]) 08/17/2017    Past Surgical History:  Procedure Laterality Date  . COLONOSCOPY WITH PROPOFOL N/A 10/09/2017   Procedure: COLONOSCOPY WITH PROPOFOL;  Surgeon: Toledo, Benay Pike, MD;  Location: [facility] ENDOSCOPY;  Service: Gastroenterology;  Laterality: N/A;  . COLONOSCOPY WITH PROPOFOL N/A 02/06/2018   Procedure: COLONOSCOPY WITH PROPOFOL;  Surgeon: Toledo, Benay Pike, MD;  Location: [facility] ENDOSCOPY;  Service: Gastroenterology;  Laterality: N/A;  . ESOPHAGOGASTRODUODENOSCOPY (EGD) WITH PROPOFOL N/A 10/09/2017   Procedure: ESOPHAGOGASTRODUODENOSCOPY (EGD) WITH PROPOFOL;  Surgeon: Toledo, Benay Pike, MD;  Location: [facility] ENDOSCOPY;  Service: Gastroenterology;  Laterality: N/A;  . HAND SURGERY    . KIDNEY STONE SURGERY      Prior to Admission medications   Medication Sig Start Date End Date Taking? Authorizing Provider  acetaminophen (TYLENOL) 325 MG tablet Take 325 mg by mouth every 6 (six) hours as needed.    [provider]  albuterol (PROVENTIL HFA;VENTOLIN HFA) 108 (90 BASE) MCG/ACT inhaler Inhale 2 puffs into the lungs every 6 (six) hours as needed for wheezing or shortness of breath. 11/07/15   Orbie Pyo, MD  amLODipine (NORVASC) 5 MG tablet Take 5 mg by mouth daily.    [provider]  amoxicillin (AMOXIL) 875 MG tablet Take 1 tablet (875 mg total) by mouth 2 (two) times daily. 11/28/17   Fisher, Linden Dolin, PA-C  benzonatate (TESSALON) 100 MG capsule Take 100 mg by mouth 3 (three) times daily as needed for cough.    [provider]  brompheniramine-pseudoephedrine-DM 30-2-10 MG/5ML syrup Take 10 mLs by mouth 4 (four) times daily as needed. 10/24/17   Cuthriell, Charline Bills, PA-C  buPROPion (WELLBUTRIN SR) 100 MG 12 hr tablet Take 100 mg by mouth 2 (two) times daily.    [provider]  busPIRone (BUSPAR) 30 MG tablet Take 30 mg by mouth 2 (two) times daily.    [provider]  cephALEXin (KEFLEX) 500 MG capsule Take 1 capsule (500 mg total) by mouth 2 (two) times daily for 10 days. 06/26/18 07/06/18  Gregor Hams, MD  cetirizine (ZYRTEC) 10 MG tablet Take 10 mg by mouth daily.    [provider]  cholecalciferol (VITAMIN D) 1000 units tablet Take 1,000 Units by mouth daily.    [provider]  citalopram (CELEXA) 20 MG tablet Take 20 mg by mouth daily.    [provider]  dexlansoprazole (DEXILANT) 60 MG capsule  Take 60 mg by mouth daily.    [provider]  fluticasone (FLONASE) 50 MCG/ACT nasal spray Place 1 spray into both nostrils daily.    [provider]  Fluticasone-Salmeterol (ADVAIR) 250-50 MCG/DOSE AEPB Inhale 1 puff into the lungs 2 (two) times daily.    [provider]  gabapentin (NEURONTIN) 400 MG capsule Take 400 mg by mouth 3 (three) times daily.    [provider]  ibuprofen (ADVIL,MOTRIN) 600 MG tablet Take 1 tablet (600 mg total) by mouth every 6 (six) hours as needed.  01/23/17   Triplett, Dessa Phi, FNP  linaclotide (LINZESS) 145 MCG CAPS capsule Take 145 mcg by mouth daily before breakfast.    [provider]  LORazepam (ATIVAN) 1 MG tablet Take 1 tablet (1 mg total) by mouth every 8 (eight) hours as needed for anxiety. 02/03/16   Paulette Blanch, MD  losartan (COZAAR) 100 MG tablet Take 100 mg by mouth daily.    [provider]  medroxyPROGESTERone (DEPO-PROVERA) 150 MG/ML injection Inject 150 mg into the muscle every 3 (three) months.    [provider]  meloxicam (MOBIC) 15 MG tablet Take 1 tablet (15 mg total) by mouth daily. 10/24/17   Cuthriell, Charline Bills, PA-C  metFORMIN (GLUCOPHAGE) 500 MG tablet Take 500 mg by mouth 2 (two) times daily with a meal.    [provider]  montelukast (SINGULAIR) 10 MG tablet Take 10 mg at bedtime by mouth.    [provider]  omega-3 acid ethyl esters (LOVAZA) 1 g capsule Take 1 g 2 (two) times daily by mouth.    [provider]  ondansetron (ZOFRAN) 4 MG tablet Take 1 tablet (4 mg total) by mouth daily as needed for nausea or vomiting. 09/18/17 09/18/18  [name], MD  ondansetron (ZOFRAN) 8 MG tablet Take 8 mg by mouth every 8 (eight) hours as needed for nausea or vomiting.    [provider]  polyethylene glycol (MIRALAX / GLYCOLAX) packet Take 17 g by mouth every other day.    [provider]  propranolol (INDERAL) 40 MG tablet Take 20-40 mg by mouth 2 (two) times daily. Take 40 mg in the am and 20 mg at bedtime    [provider]  sennosides-docusate sodium (SENOKOT-S) 8.6-50 MG tablet Take 1 tablet by mouth daily.    [provider]  simvastatin (ZOCOR) 20 MG tablet Take 20 mg by mouth daily.    [provider]  vortioxetine HBr (TRINTELLIX) 10 MG TABS Take 10 mg daily by mouth.    [provider]    Allergies No known drug allergies  Family History  Problem Relation Age of Onset  . Breast cancer Mother 46        pre pt.     Social History Social History   Tobacco Use  . Smoking status: Never Smoker  . Smokeless tobacco: Never Used  Substance Use Topics  . Alcohol use: Yes    Comment: OCCASSIONALLY  . Drug use: No    Review of Systems Constitutional: No fever/chills Eyes: No visual changes. ENT: No sore throat. Cardiovascular: Denies chest pain. Respiratory: Denies shortness of breath. Gastrointestinal: No abdominal pain.  No nausea, no vomiting.  No diarrhea.  No constipation. Genitourinary: Positive for urinary frequency urgency and dysuria Musculoskeletal: Negative for neck pain.  Negative for back pain. Integumentary: Negative for rash. Neurological: Negative for headaches, focal weakness or numbness.   ____________________________________________   PHYSICAL EXAM:  VITAL  SIGNS: ED Triage Vitals  Enc Vitals Group     BP 06/25/18 2013 118/85     Pulse Rate 06/25/18 2013 80     Resp 06/25/18 2013 18     Temp 06/25/18 2013 98.6 F (37 C)     Temp Source 06/25/18 2013 Oral     SpO2 06/25/18 2013 98 %     Weight 06/25/18 2014 79.8 kg (176 lb)     Height 06/25/18 2014 1.575 m (5\' 2" )     Head Circumference --      Peak Flow --      Pain Score 06/25/18 2020 10     Pain Loc --      Pain Edu? --      Excl. in [place]? --     Constitutional: Alert and oriented. Well appearing and in no acute distress. Eyes: Conjunctivae are normal. Head: Atraumatic. Mouth/Throat: Mucous membranes are moist.  Oropharynx non-erythematous. Neck: No stridor.   Cardiovascular: Normal rate, regular rhythm. Good peripheral circulation. Grossly normal heart sounds. Respiratory: Normal respiratory effort.  No retractions. Lungs CTAB. Gastrointestinal: Soft and nontender. No distention.  Musculoskeletal: No lower extremity tenderness nor edema. No gross deformities of extremities. Neurologic:  Normal speech and language. No gross focal neurologic deficits are appreciated.  Skin:  Skin is warm,  dry and intact. No rash noted. Psychiatric: Mood and affect are normal. Speech and behavior are normal.  ____________________________________________   LABS (all labs ordered are listed, but only abnormal results are displayed)  Labs Reviewed  URINALYSIS, COMPLETE (UACMP) WITH MICROSCOPIC - Abnormal; Notable for the following components:      Result Value   Color, Urine YELLOW (*)    APPearance CLOUDY (*)    Hgb urine dipstick MODERATE (*)    Protein, ur 30 (*)    Nitrite POSITIVE (*)    Leukocytes, UA MODERATE (*)    WBC, UA >50 (*)    Bacteria, UA RARE (*)    All other components within normal limits  POC URINE PREG, ED  POCT PREGNANCY, URINE     RADIOLOGY I, [place] N Zoiey Christy, personally viewed and evaluated these images (plain radiographs) as part of my medical decision making, as well as reviewing the written report by the radiologist.  ED MD interpretation: Left hip x-ray negative per radiologist  Official radiology report(s): Dg Hip Unilat W Or Wo Pelvis 2-3 Views Left  Result Date: 06/26/2018 CLINICAL DATA:  Urinary frequency for a week. Left hip pain for years. Fall. EXAM: DG HIP (WITH OR WITHOUT PELVIS) 2-3V LEFT COMPARISON:  None. FINDINGS: There is no evidence of hip fracture or dislocation. There is no evidence of arthropathy or other focal bone abnormality. IMPRESSION: Negative. Electronically Signed   By: Lucienne Capers M.D.   On: 06/26/2018 01:23     Procedures   ____________________________________________   INITIAL IMPRESSION / ASSESSMENT AND PLAN / ED COURSE  As part of my medical decision making, I reviewed the following data within the electronic MEDICAL RECORD NUMBER  52 year old female present with above-stated history and physical exam secondary to the urgency frequency and dysuria.  Concerns of possible UTI and as such urinalysis was obtained which is consistent with a UTI.  Patient also complained of left hip pain and as such an x-ray was performed  which revealed no acute abnormality. ____________________________________________  FINAL CLINICAL IMPRESSION(S) / ED DIAGNOSES  Final diagnoses:  Acute cystitis without hematuria     MEDICATIONS GIVEN DURING THIS VISIT:  Medications  cephALEXin (KEFLEX) capsule 500 mg (500 mg Oral Given 06/26/18 0128)     ED Discharge Orders        Ordered    cephALEXin (KEFLEX) 500 MG capsule  2 times daily     06/26/18 0209       Note:  This document was prepared using Dragon voice recognition software and may include unintentional dictation errors.    Gregor Hams, MD 06/26/18 [phone]

## 2018-06-26 NOTE — ED Notes (Signed)
Patient states she does not have legal guardian. No legal guardian name or contact info found in computer. Patient states she typically takes taxi home. This was verified by previous note.

## 2018-06-26 NOTE — ED Notes (Signed)
Attempted to call Ladona Mow for patient to take Taxi back to [facility] group home. Unable to contact [employer] or [employer]. Patient left ER to take bus home.

## 2018-06-27 ENCOUNTER — Other Ambulatory Visit: Payer: Self-pay

## 2018-06-27 ENCOUNTER — Emergency Department
Admission: EM | Admit: 2018-06-27 | Discharge: 2018-06-28 | Disposition: A | Discharge status: Home / Self Care | Payer: Medicare Other | Attending: Emergency Medicine | Admitting: Emergency Medicine

## 2018-06-27 DIAGNOSIS — G43009 Migraine without aura, not intractable, without status migrainosus: Secondary | ICD-10-CM

## 2018-06-27 DIAGNOSIS — G43909 Migraine, unspecified, not intractable, without status migrainosus: Secondary | ICD-10-CM | POA: Insufficient documentation

## 2018-06-27 DIAGNOSIS — E119 Type 2 diabetes mellitus without complications: Secondary | ICD-10-CM | POA: Insufficient documentation

## 2018-06-27 DIAGNOSIS — Z7984 Long term (current) use of oral hypoglycemic drugs: Secondary | ICD-10-CM | POA: Insufficient documentation

## 2018-06-27 DIAGNOSIS — E785 Hyperlipidemia, unspecified: Secondary | ICD-10-CM | POA: Diagnosis not present

## 2018-06-27 DIAGNOSIS — Z79899 Other long term (current) drug therapy: Secondary | ICD-10-CM | POA: Diagnosis not present

## 2018-06-27 DIAGNOSIS — R51 Headache: Secondary | ICD-10-CM | POA: Diagnosis present

## 2018-06-27 DIAGNOSIS — I10 Essential (primary) hypertension: Secondary | ICD-10-CM | POA: Diagnosis not present

## 2018-06-27 LAB — COMPREHENSIVE METABOLIC PANEL
ALBUMIN: 4.2 g/dL (ref 3.5–5.0)
ALT: 17 U/L (ref 0–44)
AST: 16 U/L (ref 15–41)
Alkaline Phosphatase: 64 U/L (ref 38–126)
Anion gap: 9 (ref 5–15)
BILIRUBIN TOTAL: 0.5 mg/dL (ref 0.3–1.2)
BUN: 10 mg/dL (ref 6–20)
CALCIUM: 8.9 mg/dL (ref 8.9–10.3)
CO2: 24 mmol/L (ref 22–32)
CREATININE: 0.61 mg/dL (ref 0.44–1.00)
Chloride: 111 mmol/L (ref 98–111)
GFR calc Af Amer: >60 mL/min (ref >60)
GFR calc non Af Amer: >60 mL/min (ref >60)
GLUCOSE: 192 mg/dL — AB (ref 70–99)
Potassium: 3.5 mmol/L (ref 3.5–5.1)
SODIUM: 144 mmol/L (ref 135–145)
TOTAL PROTEIN: 6.9 g/dL (ref 6.5–8.1)

## 2018-06-27 LAB — CBC
HEMATOCRIT: 35.6 % (ref 35.0–47.0)
HEMOGLOBIN: 12.3 g/dL (ref 12.0–16.0)
MCH: 29.8 pg (ref 26.0–34.0)
MCHC: 34.7 g/dL (ref 32.0–36.0)
MCV: 85.9 fL (ref 80.0–100.0)
Platelets: 248 10*3/uL (ref 150–440)
RBC: 4.14 MIL/uL (ref 3.80–5.20)
RDW: 14.3 % (ref 11.5–14.5)
WBC: 6.8 10*3/uL (ref 3.6–11.0)

## 2018-06-27 MED ORDER — KETOROLAC TROMETHAMINE 30 MG/ML IJ SOLN
15.0000 mg | Freq: Once | INTRAMUSCULAR | Status: AC
  Administered 2018-06-27: 15 mg via INTRAVENOUS
  Filled 2018-06-27: qty 1

## 2018-06-27 MED ORDER — METOCLOPRAMIDE HCL 5 MG/ML IJ SOLN
10.0000 mg | INTRAMUSCULAR | Status: AC
  Administered 2018-06-27: 10 mg via INTRAVENOUS
  Filled 2018-06-27: qty 2

## 2018-06-27 MED ORDER — SODIUM CHLORIDE 0.9 % IV BOLUS
500.0000 mL | Freq: Once | INTRAVENOUS | Status: AC
  Administered 2018-06-27: 500 mL via INTRAVENOUS

## 2018-06-27 MED ORDER — DIPHENHYDRAMINE HCL 50 MG/ML IJ SOLN
12.5000 mg | INTRAMUSCULAR | Status: AC
  Administered 2018-06-27: 12.5 mg via INTRAVENOUS
  Filled 2018-06-27: qty 1

## 2018-06-27 NOTE — ED Provider Notes (Signed)
[facility] Emergency Department Provider Note  ____________________________________________   First MD Initiated Contact with Patient 06/27/18 2331     (approximate)  I have reviewed the triage vital signs and the nursing notes.   HISTORY  Chief Complaint Headache  History is limited by the patient having a chronic cognitive deficit.  She has a legal guardian, her mother, who is at bedside and helps provide history.  HPI Hannah Clarke is a 52 y.o. female with medical history as listed below including chronic headaches/migraines who presents for evaluation of a gradually worsening and now severe global headache starting yesterday.  Light makes it worse.  Nothing makes it better.  She reportedly has some medicine to help with headaches at home but it is unclear whether or not she is taken any of it.  She has felt nauseated but no vomiting.  She has not been eating or drinking very much lately.  No history of fever/chills, chest pain, shortness of breath, nor abdominal pain.  She was recently prescribed Keflex for urinary tract infection and she has taken some of the medication but not all of it.  She is not having any abdominal pain nor dysuria at this time.  Past Medical History:  Diagnosis Date  . Anxiety   . Chronic headaches   . Depression   . Dermatophytosis of nail   . Diabetes mellitus without complication ([place])   . GERD (gastroesophageal reflux disease)   . High cholesterol   . Hypertension   . Mental developmental delay   . Onychia of toe   . Panic attacks     Patient Active Problem List   Diagnosis Date Noted  . Unstable angina ([place]) 08/17/2017    Past Surgical History:  Procedure Laterality Date  . COLONOSCOPY WITH PROPOFOL N/A 10/09/2017   Procedure: COLONOSCOPY WITH PROPOFOL;  Surgeon: Toledo, Benay Pike, MD;  Location: [facility] ENDOSCOPY;  Service: Gastroenterology;  Laterality: N/A;  . COLONOSCOPY WITH PROPOFOL N/A 02/06/2018   Procedure:  COLONOSCOPY WITH PROPOFOL;  Surgeon: Toledo, Benay Pike, MD;  Location: [facility] ENDOSCOPY;  Service: Gastroenterology;  Laterality: N/A;  . ESOPHAGOGASTRODUODENOSCOPY (EGD) WITH PROPOFOL N/A 10/09/2017   Procedure: ESOPHAGOGASTRODUODENOSCOPY (EGD) WITH PROPOFOL;  Surgeon: Toledo, Benay Pike, MD;  Location: [facility] ENDOSCOPY;  Service: Gastroenterology;  Laterality: N/A;  . HAND SURGERY    . KIDNEY STONE SURGERY      Prior to Admission medications   Medication Sig Start Date End Date Taking? Authorizing Provider  acetaminophen (TYLENOL) 325 MG tablet Take 325 mg by mouth every 6 (six) hours as needed.    [provider]  albuterol (PROVENTIL HFA;VENTOLIN HFA) 108 (90 BASE) MCG/ACT inhaler Inhale 2 puffs into the lungs every 6 (six) hours as needed for wheezing or shortness of breath. 11/07/15   Orbie Pyo, MD  amLODipine (NORVASC) 5 MG tablet Take 5 mg by mouth daily.    [provider]  amoxicillin (AMOXIL) 875 MG tablet Take 1 tablet (875 mg total) by mouth 2 (two) times daily. 11/28/17   Fisher, Linden Dolin, PA-C  benzonatate (TESSALON) 100 MG capsule Take 100 mg by mouth 3 (three) times daily as needed for cough.    [provider]  brompheniramine-pseudoephedrine-DM 30-2-10 MG/5ML syrup Take 10 mLs by mouth 4 (four) times daily as needed. 10/24/17   Cuthriell, Charline Bills, PA-C  buPROPion (WELLBUTRIN SR) 100 MG 12 hr tablet Take 100 mg by mouth 2 (two) times daily.    [provider]  busPIRone (BUSPAR) 30 MG tablet Take 30 mg by mouth 2 (two) times daily.    [provider]  cephALEXin (KEFLEX) 500 MG capsule Take 1 capsule (500 mg total) by mouth 2 (two) times daily for 10 days. 06/26/18 07/06/18  Gregor Hams, MD  cetirizine (ZYRTEC) 10 MG tablet Take 10 mg by mouth daily.    [provider]  cholecalciferol (VITAMIN D) 1000 units tablet Take 1,000 Units by mouth daily.    [provider]  citalopram (CELEXA) 20 MG tablet Take  20 mg by mouth daily.    [provider]  dexlansoprazole (DEXILANT) 60 MG capsule Take 60 mg by mouth daily.    [provider]  fluticasone (FLONASE) 50 MCG/ACT nasal spray Place 1 spray into both nostrils daily.    [provider]  Fluticasone-Salmeterol (ADVAIR) 250-50 MCG/DOSE AEPB Inhale 1 puff into the lungs 2 (two) times daily.    [provider]  gabapentin (NEURONTIN) 400 MG capsule Take 400 mg by mouth 3 (three) times daily.    [provider]  ibuprofen (ADVIL,MOTRIN) 600 MG tablet Take 1 tablet (600 mg total) by mouth every 6 (six) hours as needed. 01/23/17   Triplett, Dessa Phi, FNP  linaclotide (LINZESS) 145 MCG CAPS capsule Take 145 mcg by mouth daily before breakfast.    [provider]  LORazepam (ATIVAN) 1 MG tablet Take 1 tablet (1 mg total) by mouth every 8 (eight) hours as needed for anxiety. 02/03/16   Paulette Blanch, MD  losartan (COZAAR) 100 MG tablet Take 100 mg by mouth daily.    [provider]  medroxyPROGESTERone (DEPO-PROVERA) 150 MG/ML injection Inject 150 mg into the muscle every 3 (three) months.    [provider]  meloxicam (MOBIC) 15 MG tablet Take 1 tablet (15 mg total) by mouth daily. 10/24/17   Cuthriell, Charline Bills, PA-C  metFORMIN (GLUCOPHAGE) 500 MG tablet Take 500 mg by mouth 2 (two) times daily with a meal.    [provider]  montelukast (SINGULAIR) 10 MG tablet Take 10 mg at bedtime by mouth.    [provider]  omega-3 acid ethyl esters (LOVAZA) 1 g capsule Take 1 g 2 (two) times daily by mouth.    [provider]  ondansetron (ZOFRAN) 4 MG tablet Take 1 tablet (4 mg total) by mouth daily as needed for nausea or vomiting. 09/18/17 09/18/18  [name], MD  ondansetron (ZOFRAN) 8 MG tablet Take 8 mg by mouth every 8 (eight) hours as needed for nausea or vomiting.    [provider]  polyethylene glycol (MIRALAX / GLYCOLAX) packet Take 17 g by mouth  every other day.    [provider]  propranolol (INDERAL) 40 MG tablet Take 20-40 mg by mouth 2 (two) times daily. Take 40 mg in the am and 20 mg at bedtime    [provider]  sennosides-docusate sodium (SENOKOT-S) 8.6-50 MG tablet Take 1 tablet by mouth daily.    [provider]  simvastatin (ZOCOR) 20 MG tablet Take 20 mg by mouth daily.    [provider]  vortioxetine HBr (TRINTELLIX) 10 MG TABS Take 10 mg daily by mouth.    [provider]    Allergies Patient has no known allergies.  Family History  Problem Relation Age of Onset  . Breast cancer Mother 36       pre pt.     Social History Social History   Tobacco Use  .  Smoking status: Never Smoker  . Smokeless tobacco: Never Used  Substance Use Topics  . Alcohol use: Yes    Comment: OCCASSIONALLY  . Drug use: No    Review of Systems History is limited by the patient having a chronic cognitive deficit.  She has a legal guardian, her mother, who is at bedside and helps provide history.  Constitutional: No fever/chills Eyes: No visual changes. ENT: No sore throat. Cardiovascular: Denies chest pain. Respiratory: Denies shortness of breath. Gastrointestinal: No abdominal pain.  No nausea, no vomiting.  No diarrhea.  No constipation. Genitourinary: Negative for dysuria. Musculoskeletal: Negative for neck pain.  Negative for back pain. Integumentary: Negative for rash. Neurological: Global headache as described above with no numbness nor weakness in her extremities  ____________________________________________   PHYSICAL EXAM:  VITAL SIGNS: ED Triage Vitals  Enc Vitals Group     BP 06/27/18 2144 (!) 147/106     Pulse Rate 06/27/18 2144 66     Resp 06/27/18 2144 15     Temp 06/27/18 2144 98.7 F (37.1 C)     Temp Source 06/27/18 2144 Oral     SpO2 06/27/18 2144 99 %     Weight 06/27/18 2145 79.8 kg (176 lb)     Height --      Head Circumference --      Peak Flow  --      Pain Score 06/27/18 2145 9     Pain Loc --      Pain Edu? --      Excl. in [place]? --     Constitutional: Alert, appears uncomfortable but is not in acute distress, reportedly at her mental/psychiatric baseline. Eyes: Conjunctivae are normal.  Slightly disconjugate gaze at baseline but pupils are equal and reactive and extraocular motion is intact Head: Atraumatic. Nose: No congestion/rhinnorhea. Mouth/Throat: Mucous membranes are moist. Neck: No stridor.  No meningeal signs.   Cardiovascular: Normal rate, regular rhythm. Good peripheral circulation. Grossly normal heart sounds. Respiratory: Normal respiratory effort.  No retractions. Lungs CTAB. Gastrointestinal: Soft and nontender. No distention.  Musculoskeletal: No lower extremity tenderness nor edema. No gross deformities of extremities. Neurologic:  Normal speech and language. No gross focal neurologic deficits are appreciated.  Skin:  Skin is warm, dry and intact. No rash noted.  ____________________________________________   LABS (all labs ordered are listed, but only abnormal results are displayed)  Labs Reviewed  COMPREHENSIVE METABOLIC PANEL - Abnormal; Notable for the following components:      Result Value   Glucose, Bld 192 (*)    All other components within normal limits  CBC   ____________________________________________  EKG  None - EKG not ordered by ED physician ____________________________________________  RADIOLOGY   ED MD interpretation: No indication for imaging  Official radiology report(s): No results found.  ____________________________________________   PROCEDURES  Critical Care performed: No   Procedure(s) performed:   Procedures   ____________________________________________   INITIAL IMPRESSION / ASSESSMENT AND PLAN / ED COURSE  As part of my medical decision making, I reviewed the following data within the [place] History obtained from family,  Nursing notes reviewed and incorporated, Labs reviewed , Old chart reviewed and Notes from prior ED visits    Differential diagnosis includes, but is not limited to, migraine, acute on chronic nonspecific headaches, cluster headaches, nontraumatic subarachnoid hemorrhage, medication side effect, acute infection.  She has no signs of meningismus or any other symptoms to make me concerned about meningitis or encephalopathy.  She  has not been eating or drinking very much and volume depletion may play a role.  Her labs are reassuring with a normal CMP and CBC.  All signs are stable although blood pressure is slightly elevated.  She and her mother both report a history of migraines.  I reviewed the medical record and see that she has had multiple CT scans of the head in the past that have always been reassuring.  No indication for imaging today particularly given no history of trauma and no warning signs to suggest a spontaneous subarachnoid hemorrhage.  I explained my plan to treat as a migraine with multiple medications and IV fluids including normal saline 500 mL bolus, metoclopramide 10 mg IV, Benadryl 12.5 mg IV, and Toradol 15 mg IV.  This is explained this the patient's boyfriend and mother both asked how long was going to take and says that they want to go home.  I suggested that he stay for treatment given that she is obviously uncomfortable and they agreed to do so but will tell us as soon as she is feeling better so that they can be discharged.  The patient agrees with the plan.  Clinical Course as of Jun 28 318  Fri Jun 28, 2018  0021 Patient feels better and reportedly fell asleep.  The family is very eager to go.  Discharged as per my previous plan with my usual and customary return precautions.   [CF]    Clinical Course User Index [CF] Hinda Kehr, MD    ____________________________________________  FINAL CLINICAL IMPRESSION(S) / ED DIAGNOSES  Final diagnoses:  Migraine without  aura and without status migrainosus, not intractable     MEDICATIONS GIVEN DURING THIS VISIT:  Medications  metoCLOPramide (REGLAN) injection 10 mg (10 mg Intravenous Given 06/27/18 2353)  diphenhydrAMINE (BENADRYL) injection 12.5 mg (12.5 mg Intravenous Given 06/27/18 2353)  ketorolac (TORADOL) 30 MG/ML injection 15 mg (15 mg Intravenous Given 06/27/18 2352)  sodium chloride 0.9 % bolus 500 mL (0 mLs Intravenous Stopped 06/28/18 0047)     ED Discharge Orders    None       Note:  This document was prepared using Dragon voice recognition software and may include unintentional dictation errors.    Hinda Kehr, MD 06/28/18 [id number]

## 2018-06-27 NOTE — Discharge Instructions (Addendum)
You have been seen in the Emergency Department (ED) for a migraine.  Please use Tylenol or Motrin as needed for symptoms, but only as written on the box, and take any regular medications that have been prescribed for you, including the antibiotics you were given previously for your urinary tract infection (UTI).  As we have discussed, please follow up with your doctor as soon as possible regarding today?s ED visit and your headache symptoms.    Call your doctor or return to the Emergency Department (ED) if you have a worsening headache, sudden and severe headache, confusion, slurred speech, facial droop, weakness or numbness in any arm or leg, extreme fatigue, or other symptoms that concern you.

## 2018-06-27 NOTE — ED Notes (Signed)
Patient given 4mg  of zofran, and 200 mL NaCL in transport by EMS

## 2018-06-27 NOTE — ED Triage Notes (Signed)
Patient coming [facility] for headache and N/V. Patient seen in this ED and dx with UTI in the last few days. Patient prescribed antibiotics and medicine for headache. Per EMS, patient denied taking headache medicine. Patient first told this RN that she did take her headache pill, then later denied taking her headache pill. Patient reports she has not been drinking enough due to nausea.

## 2018-07-15 ENCOUNTER — Other Ambulatory Visit: Payer: Self-pay | Admitting: Gastroenterology

## 2018-07-15 DIAGNOSIS — R1310 Dysphagia, unspecified: Secondary | ICD-10-CM

## 2018-07-15 DIAGNOSIS — K219 Gastro-esophageal reflux disease without esophagitis: Secondary | ICD-10-CM

## 2018-07-19 ENCOUNTER — Ambulatory Visit: Payer: Medicare Other

## 2018-07-19 ENCOUNTER — Ambulatory Visit
Admission: RE | Admit: 2018-07-19 | Discharge: 2018-07-19 | Disposition: A | Discharge status: Home / Self Care | Payer: Medicare Other | Source: Ambulatory Visit | Attending: Gastroenterology | Admitting: Gastroenterology

## 2018-07-19 DIAGNOSIS — K319 Disease of stomach and duodenum, unspecified: Secondary | ICD-10-CM | POA: Diagnosis not present

## 2018-07-19 DIAGNOSIS — K219 Gastro-esophageal reflux disease without esophagitis: Secondary | ICD-10-CM

## 2018-07-19 DIAGNOSIS — R1310 Dysphagia, unspecified: Secondary | ICD-10-CM | POA: Insufficient documentation

## 2018-07-19 DIAGNOSIS — R1012 Left upper quadrant pain: Secondary | ICD-10-CM | POA: Diagnosis not present

## 2018-07-31 ENCOUNTER — Encounter: Payer: Self-pay | Admitting: *Deleted

## 2018-07-31 ENCOUNTER — Ambulatory Visit
Admission: RE | Admit: 2018-07-31 | Discharge: 2018-07-31 | Disposition: A | Discharge status: Home / Self Care | Payer: Medicare Other | Source: Ambulatory Visit | Attending: Internal Medicine | Admitting: Internal Medicine

## 2018-07-31 ENCOUNTER — Ambulatory Visit: Payer: Medicare Other | Admitting: Anesthesiology

## 2018-07-31 ENCOUNTER — Encounter
Admission: RE | Disposition: A | Discharge status: Home / Self Care | Payer: Self-pay | Source: Ambulatory Visit | Attending: Internal Medicine

## 2018-07-31 DIAGNOSIS — Z7902 Long term (current) use of antithrombotics/antiplatelets: Secondary | ICD-10-CM | POA: Insufficient documentation

## 2018-07-31 DIAGNOSIS — R1310 Dysphagia, unspecified: Secondary | ICD-10-CM | POA: Insufficient documentation

## 2018-07-31 DIAGNOSIS — K219 Gastro-esophageal reflux disease without esophagitis: Secondary | ICD-10-CM | POA: Diagnosis not present

## 2018-07-31 DIAGNOSIS — K317 Polyp of stomach and duodenum: Secondary | ICD-10-CM | POA: Insufficient documentation

## 2018-07-31 DIAGNOSIS — R1012 Left upper quadrant pain: Secondary | ICD-10-CM | POA: Insufficient documentation

## 2018-07-31 DIAGNOSIS — E7800 Pure hypercholesterolemia, unspecified: Secondary | ICD-10-CM | POA: Diagnosis not present

## 2018-07-31 DIAGNOSIS — R110 Nausea: Secondary | ICD-10-CM | POA: Diagnosis not present

## 2018-07-31 DIAGNOSIS — F329 Major depressive disorder, single episode, unspecified: Secondary | ICD-10-CM | POA: Diagnosis not present

## 2018-07-31 DIAGNOSIS — Z79899 Other long term (current) drug therapy: Secondary | ICD-10-CM | POA: Insufficient documentation

## 2018-07-31 DIAGNOSIS — E119 Type 2 diabetes mellitus without complications: Secondary | ICD-10-CM | POA: Diagnosis not present

## 2018-07-31 DIAGNOSIS — F419 Anxiety disorder, unspecified: Secondary | ICD-10-CM | POA: Diagnosis not present

## 2018-07-31 DIAGNOSIS — F79 Unspecified intellectual disabilities: Secondary | ICD-10-CM | POA: Insufficient documentation

## 2018-07-31 DIAGNOSIS — K2960 Other gastritis without bleeding: Secondary | ICD-10-CM | POA: Diagnosis not present

## 2018-07-31 DIAGNOSIS — Z791 Long term (current) use of non-steroidal anti-inflammatories (NSAID): Secondary | ICD-10-CM | POA: Diagnosis not present

## 2018-07-31 DIAGNOSIS — K449 Diaphragmatic hernia without obstruction or gangrene: Secondary | ICD-10-CM | POA: Insufficient documentation

## 2018-07-31 DIAGNOSIS — Z7951 Long term (current) use of inhaled steroids: Secondary | ICD-10-CM | POA: Insufficient documentation

## 2018-07-31 DIAGNOSIS — I10 Essential (primary) hypertension: Secondary | ICD-10-CM | POA: Diagnosis not present

## 2018-07-31 DIAGNOSIS — Z7984 Long term (current) use of oral hypoglycemic drugs: Secondary | ICD-10-CM | POA: Diagnosis not present

## 2018-07-31 HISTORY — PX: ESOPHAGOGASTRODUODENOSCOPY (EGD) WITH PROPOFOL: SHX5813

## 2018-07-31 LAB — POCT PREGNANCY, URINE: PREG TEST UR: NEGATIVE

## 2018-07-31 LAB — GLUCOSE, CAPILLARY: GLUCOSE-CAPILLARY: 134 mg/dL — AB (ref 70–99)

## 2018-07-31 SURGERY — ESOPHAGOGASTRODUODENOSCOPY (EGD) WITH PROPOFOL
Anesthesia: General

## 2018-07-31 MED ORDER — LIDOCAINE HCL (PF) 2 % IJ SOLN
INTRAMUSCULAR | Status: AC
  Filled 2018-07-31: qty 10

## 2018-07-31 MED ORDER — SODIUM CHLORIDE 0.9 % IV SOLN
INTRAVENOUS | Status: DC
  Administered 2018-07-31: 09:00:00 via INTRAVENOUS

## 2018-07-31 MED ORDER — PROPOFOL 500 MG/50ML IV EMUL
INTRAVENOUS | Status: AC
  Filled 2018-07-31: qty 50

## 2018-07-31 MED ORDER — PROPOFOL 500 MG/50ML IV EMUL
INTRAVENOUS | Status: DC | PRN
  Administered 2018-07-31: 125 ug/kg/min via INTRAVENOUS

## 2018-07-31 MED ORDER — PROPOFOL 10 MG/ML IV BOLUS
INTRAVENOUS | Status: DC | PRN
  Administered 2018-07-31: 100 mg via INTRAVENOUS

## 2018-07-31 MED ORDER — LIDOCAINE HCL (CARDIAC) PF 100 MG/5ML IV SOSY
PREFILLED_SYRINGE | INTRAVENOUS | Status: DC | PRN
  Administered 2018-07-31: 40 mg via INTRAVENOUS

## 2018-07-31 NOTE — Anesthesia Post-op Follow-up Note (Signed)
Anesthesia QCDR form completed.        

## 2018-07-31 NOTE — Anesthesia Preprocedure Evaluation (Signed)
Anesthesia Evaluation  Patient identified by MRN, date of birth, ID band Patient awake    Reviewed: Allergy & Precautions, H&P , NPO status , Patient's Chart, lab work & pertinent test results, reviewed documented beta blocker date and time   Airway Mallampati: II   Neck ROM: full    Dental  (+) Teeth Intact, Poor Dentition   Pulmonary neg pulmonary ROS,    Pulmonary exam normal        Cardiovascular Exercise Tolerance: Good hypertension, On Medications + angina with exertion negative cardio ROS Normal cardiovascular exam Rhythm:regular Rate:Normal     Neuro/Psych  Headaches, PSYCHIATRIC DISORDERS Anxiety Depression negative neurological ROS  negative psych ROS   GI/Hepatic negative GI ROS, Neg liver ROS, GERD  Medicated,  Endo/Other  negative endocrine ROSdiabetes  Renal/GU negative Renal ROS  negative genitourinary   Musculoskeletal   Abdominal   Peds  Hematology negative hematology ROS (+)   Anesthesia Other Findings Past Medical History: No date: Anxiety No date: Chronic headaches No date: Depression No date: Dermatophytosis of nail No date: Diabetes mellitus without complication (HCC) No date: GERD (gastroesophageal reflux disease) No date: High cholesterol No date: Hypertension No date: Mental developmental delay No date: Onychia of toe No date: Panic attacks Past Surgical History: 10/09/2017: COLONOSCOPY WITH PROPOFOL; N/A     Comment:  Procedure: COLONOSCOPY WITH PROPOFOL;  Surgeon: Toledo,               Benay Pike, MD;  Location: [facility] ENDOSCOPY;  Service:               Gastroenterology;  Laterality: N/A; 02/06/2018: COLONOSCOPY WITH PROPOFOL; N/A     Comment:  Procedure: COLONOSCOPY WITH PROPOFOL;  Surgeon: Toledo,               Benay Pike, MD;  Location: [facility] ENDOSCOPY;  Service:               Gastroenterology;  Laterality: N/A; 10/09/2017: ESOPHAGOGASTRODUODENOSCOPY (EGD) WITH PROPOFOL; N/A  Comment:  Procedure: ESOPHAGOGASTRODUODENOSCOPY (EGD) WITH               PROPOFOL;  Surgeon: Toledo, Benay Pike, MD;  Location:               [facility] ENDOSCOPY;  Service: Gastroenterology;  Laterality:               N/A; No date: HAND SURGERY No date: KIDNEY STONE SURGERY   Reproductive/Obstetrics negative OB ROS                             Anesthesia Physical Anesthesia Plan  ASA: III  Anesthesia Plan: General   Post-op Pain Management:    Induction:   PONV Risk Score and Plan:   Airway Management Planned:   Additional Equipment:   Intra-op Plan:   Post-operative Plan:   Informed Consent: I have reviewed the patients History and Physical, chart, labs and discussed the procedure including the risks, benefits and alternatives for the proposed anesthesia with the patient or authorized representative who has indicated his/her understanding and acceptance.   Dental Advisory Given  Plan Discussed with: CRNA  Anesthesia Plan Comments:         Anesthesia Quick Evaluation

## 2018-07-31 NOTE — Transfer of Care (Signed)
Immediate Anesthesia Transfer of Care Note  Patient: Hannah Clarke  Procedure(s) Performed: ESOPHAGOGASTRODUODENOSCOPY (EGD) WITH PROPOFOL (N/A )  Patient Location: Endoscopy Unit  Anesthesia Type:General  Level of Consciousness: drowsy and patient cooperative  Airway & Oxygen Therapy: Patient Spontanous Breathing  Post-op Assessment: Report given to RN, Post -op Vital signs reviewed and stable and Patient moving all extremities  Post vital signs: Reviewed and stable  Last Vitals:  Vitals Value Taken Time  BP 130/87 07/31/2018  8:51 AM  Temp 36.3 C 07/31/2018  8:50 AM  Pulse 67 07/31/2018  8:52 AM  Resp 23 07/31/2018  8:52 AM  SpO2 97 % 07/31/2018  8:52 AM  Vitals shown include unvalidated device data.  Last Pain:  Vitals:   07/31/18 0850  TempSrc: Tympanic  PainSc: Asleep         Complications: No apparent anesthesia complications

## 2018-07-31 NOTE — Op Note (Signed)
[facility] Gastroenterology Patient Name: Hannah Clarke Procedure Date: 07/31/2018 8:30 AM MRN: [id number] Account #: [id number] Date of Birth: [birth year] Admit Type: Outpatient Age: 52 Room: [facility] ENDO ROOM 2 Gender: Female Note Status: Finalized Procedure:            Upper GI endoscopy Indications:          Abdominal pain in the left upper quadrant, Dysphagia,                        Abnormal UGI series Providers:            Benay Pike. Alice Reichert MD, MD Referring MD:         Shari Prows Medicines:            Propofol per Anesthesia Complications:        No immediate complications. Procedure:            Pre-Anesthesia Assessment:                       - The risks and benefits of the procedure and the                        sedation options and risks were discussed with the                        patient. All questions were answered and informed                        consent was obtained.                       - Patient identification and proposed procedure were                        verified prior to the procedure by the nurse. The                        procedure was verified in the procedure room.                       - ASA Grade Assessment: III - A patient with severe                        systemic disease.                       - After reviewing the risks and benefits, the patient                        was deemed in satisfactory condition to undergo the                        procedure.                       After obtaining informed consent, the endoscope was                        passed under direct vision. Throughout the procedure,  the patient's blood pressure, pulse, and oxygen                        saturations were monitored continuously. The Endoscope                        was introduced through the mouth, and advanced to the                        third part of duodenum. The upper GI endoscopy was   accomplished without difficulty. The patient tolerated                        the procedure well. Findings:      No endoscopic abnormality was evident in the esophagus to explain the       patient's complaint of dysphagia.      Striped mildly erythematous mucosa without bleeding was found in the       gastric body and in the gastric antrum. Biopsies were taken with a cold       forceps for Helicobacter pylori testing.      Multiple 2 to 8 mm pedunculated and sessile polyps with no bleeding and       no stigmata of recent bleeding were found in the gastric fundus and in       the gastric body. Biopsies were taken with a cold forceps for histology.      A 2 cm hiatal hernia was present.      The examined duodenum was normal.      The exam was otherwise without abnormality. Impression:           - No endoscopic esophageal abnormality to explain                        patient's dysphagia.                       - Erythematous mucosa in the gastric body and antrum.                        Biopsied.                       - Multiple gastric polyps. Biopsied.                       - 2 cm hiatal hernia.                       - Normal examined duodenum.                       - The examination was otherwise normal. Recommendation:       - Patient has a contact number available for                        emergencies. The signs and symptoms of potential                        delayed complications were discussed with the patient.                        Return to  normal activities tomorrow. Written discharge                        instructions were provided to the patient.                       - Resume previous diet.                       - Continue present medications.                       - Await pathology results.                       - Return to physician assistant in 3 months.                       - The findings and recommendations were discussed with                        the patient.                        - The findings and recommendations were discussed with                        the designated responsible adult. Procedure Code(s):    --- Professional ---                       [phone], Esophagogastroduodenoscopy, flexible, transoral;                        with biopsy, single or multiple Diagnosis Code(s):    --- Professional ---                       R93.3, Abnormal findings on diagnostic imaging of other                        parts of digestive tract                       R10.12, Left upper quadrant pain                       K44.9, Diaphragmatic hernia without obstruction or                        gangrene                       K31.7, Polyp of stomach and duodenum                       K31.89, Other diseases of stomach and duodenum                       R13.10, Dysphagia, unspecified CPT copyright 2017 American Medical [employer]. All rights reserved. The codes documented in this report are preliminary and upon coder review may  be revised to meet current compliance requirements. Efrain Sella MD, MD 07/31/2018 8:52:36 AM This report has been signed electronically. Number of Addenda: 0 Note Initiated On: 07/31/2018 8:30 AM  [facility]

## 2018-07-31 NOTE — Interval H&P Note (Signed)
History and Physical Interval Note:  07/31/2018 8:29 AM  Hannah Clarke  has presented today for surgery, with the diagnosis of GERD  DYSPHAGIA  The various methods of treatment have been discussed with the patient and family. After consideration of risks, benefits and other options for treatment, the patient has consented to  Procedure(s): ESOPHAGOGASTRODUODENOSCOPY (EGD) WITH PROPOFOL (N/A) as a surgical intervention .  The patient's history has been reviewed, patient examined, no change in status, stable for surgery.  I have reviewed the patient's chart and labs.  Questions were answered to the patient's satisfaction.     [place], [place]

## 2018-07-31 NOTE — H&P (Signed)
Outpatient short stay form Pre-procedure 07/31/2018 8:28 AM Hannah Clarke K. Alice Reichert, M.D.  Primary Physician: Margurite Auerbach, nurse practitioner  Reason for visit: Dysphagia, nausea, left upper quadrant pain, GERD  History of present illness: 52 year old female with a history of intellectual disability presents for the above symptoms.  Patient takes ranitidine twice a day with moderate control of reflux.  Dysphagia is intermittent.  Barium swallow revealed no evidence of esophageal obstruction or stricture.  Upper GI series, however, showed multiple polypoid lesions in the stomach and endoscopy was advised.    Current Facility-Administered Medications:  .  0.9 %  sodium chloride infusion, , Intravenous, Continuous, Kalmen Lollar, Benay Pike, MD  Medications Prior to Admission  Medication Sig Dispense Refill Last Dose  . amLODipine (NORVASC) 5 MG tablet Take 5 mg by mouth daily.   07/31/2018 at 0700  . cetirizine (ZYRTEC) 10 MG tablet Take 10 mg by mouth daily.   07/31/2018 at 0700  . Fluticasone-Salmeterol (ADVAIR) 250-50 MCG/DOSE AEPB Inhale 1 puff into the lungs 2 (two) times daily.   07/31/2018 at 0700  . gabapentin (NEURONTIN) 400 MG capsule Take 400 mg by mouth 3 (three) times daily.   07/31/2018 at 0700  . losartan (COZAAR) 100 MG tablet Take 100 mg by mouth daily.   07/31/2018 at 0700  . propranolol (INDERAL) 40 MG tablet Take 20-40 mg by mouth 2 (two) times daily. Take 40 mg in the am and 20 mg at bedtime   07/31/2018 at 0700  . acetaminophen (TYLENOL) 325 MG tablet Take 325 mg by mouth every 6 (six) hours as needed.     Marland Kitchen albuterol (PROVENTIL HFA;VENTOLIN HFA) 108 (90 BASE) MCG/ACT inhaler Inhale 2 puffs into the lungs every 6 (six) hours as needed for wheezing or shortness of breath. 1 Inhaler 2 02/06/2018 at Unknown time  . amoxicillin (AMOXIL) 875 MG tablet Take 1 tablet (875 mg total) by mouth 2 (two) times daily. 20 tablet 0   . benzonatate (TESSALON) 100 MG capsule Take 100 mg by mouth 3 (three)  times daily as needed for cough.     . brompheniramine-pseudoephedrine-DM 30-2-10 MG/5ML syrup Take 10 mLs by mouth 4 (four) times daily as needed. 200 mL 0   . buPROPion (WELLBUTRIN SR) 100 MG 12 hr tablet Take 100 mg by mouth 2 (two) times daily.   02/06/2018 at Unknown time  . busPIRone (BUSPAR) 30 MG tablet Take 30 mg by mouth 2 (two) times daily.   02/06/2018 at Unknown time  . cholecalciferol (VITAMIN D) 1000 units tablet Take 1,000 Units by mouth daily.   08/17/2017 at Unknown time  . citalopram (CELEXA) 20 MG tablet Take 20 mg by mouth daily.   02/06/2018 at Unknown time  . clopidogrel (PLAVIX) 75 MG tablet Take 75 mg by mouth daily.   Completed Course at Unknown time  . dexlansoprazole (DEXILANT) 60 MG capsule Take 60 mg by mouth daily.   08/17/2017 at Unknown time  . fluticasone (FLONASE) 50 MCG/ACT nasal spray Place 1 spray into both nostrils daily.   10/09/2017 at Unknown time  . ibuprofen (ADVIL,MOTRIN) 600 MG tablet Take 1 tablet (600 mg total) by mouth every 6 (six) hours as needed. 30 tablet 0   . linaclotide (LINZESS) 145 MCG CAPS capsule Take 145 mcg by mouth daily before breakfast.   10/08/2017 at Unknown time  . LORazepam (ATIVAN) 1 MG tablet Take 1 tablet (1 mg total) by mouth every 8 (eight) hours as needed for anxiety. 15 tablet 0 prn at  prn  . medroxyPROGESTERone (DEPO-PROVERA) 150 MG/ML injection Inject 150 mg into the muscle every 3 (three) months.     . meloxicam (MOBIC) 15 MG tablet Take 1 tablet (15 mg total) by mouth daily. 30 tablet 0   . metFORMIN (GLUCOPHAGE) 500 MG tablet Take 500 mg by mouth 2 (two) times daily with a meal.   02/05/2018 at Unknown time  . montelukast (SINGULAIR) 10 MG tablet Take 10 mg at bedtime by mouth.   02/06/2018 at Unknown time  . omega-3 acid ethyl esters (LOVAZA) 1 g capsule Take 1 g 2 (two) times daily by mouth.   Past Week at Unknown time  . ondansetron (ZOFRAN) 4 MG tablet Take 1 tablet (4 mg total) by mouth daily as needed for nausea or vomiting. 14  tablet 0   . ondansetron (ZOFRAN) 8 MG tablet Take 8 mg by mouth every 8 (eight) hours as needed for nausea or vomiting.     . polyethylene glycol (MIRALAX / GLYCOLAX) packet Take 17 g by mouth every other day.   Past Week at Unknown time  . sennosides-docusate sodium (SENOKOT-S) 8.6-50 MG tablet Take 1 tablet by mouth daily.   10/08/2017 at Unknown time  . simvastatin (ZOCOR) 20 MG tablet Take 20 mg by mouth daily.   02/06/2018 at Unknown time  . vortioxetine HBr (TRINTELLIX) 10 MG TABS Take 10 mg daily by mouth.   10/08/2017 at Unknown time     No Known Allergies   Past Medical History:  Diagnosis Date  . Anxiety   . Chronic headaches   . Depression   . Dermatophytosis of nail   . Diabetes mellitus without complication ([place])   . GERD (gastroesophageal reflux disease)   . High cholesterol   . Hypertension   . Mental developmental delay   . Onychia of toe   . Panic attacks     Review of systems:  Otherwise negative.    Physical Exam  Gen: Alert, oriented. Appears stated age.  HEENT: [place]/AT. PERRLA. Lungs: CTA, no wheezes. CV: RR nl S1, S2. Abd: soft, benign, no masses. BS+ Ext: No edema. Pulses 2+    Planned procedures: Proceed with EGD. The patient understands the nature of the planned procedure, indications, risks, alternatives and potential complications including but not limited to bleeding, infection, perforation, damage to internal organs and possible oversedation/side effects from anesthesia. The patient agrees and gives consent to proceed.  Please refer to procedure notes for findings, recommendations and patient disposition/instructions.     Hannah Clarke K. Alice Reichert, M.D. Gastroenterology 07/31/2018  8:28 AM

## 2018-07-31 NOTE — Anesthesia Postprocedure Evaluation (Signed)
Anesthesia Post Note  Patient: Hannah Clarke  Procedure(s) Performed: ESOPHAGOGASTRODUODENOSCOPY (EGD) WITH PROPOFOL (N/A )  Patient location during evaluation: Endoscopy Anesthesia Type: General Level of consciousness: awake and alert, oriented and patient cooperative Pain management: satisfactory to patient Vital Signs Assessment: post-procedure vital signs reviewed and stable Respiratory status: spontaneous breathing and respiratory function stable Cardiovascular status: blood pressure returned to baseline and stable Postop Assessment: no headache, no backache, patient able to bend at knees, no apparent nausea or vomiting, adequate PO intake and able to ambulate Anesthetic complications: no     Last Vitals:  Vitals:   07/31/18 0809 07/31/18 0850  BP: 130/73 130/87  Pulse: 60   Resp: 16 (!) 23  Temp: (!) 36.2 C (!) 36.3 C  SpO2: 98%     Last Pain:  Vitals:   07/31/18 0850  TempSrc: Tympanic  PainSc: Asleep                 Clovis Riley Adine Heimann

## 2018-08-01 ENCOUNTER — Encounter: Payer: Self-pay | Admitting: Internal Medicine

## 2018-08-01 ENCOUNTER — Observation Stay
Admission: EM | Admit: 2018-08-01 | Discharge: 2018-08-04 | Disposition: A | Discharge status: Home Health | Payer: Medicare Other | Attending: Internal Medicine | Admitting: Internal Medicine

## 2018-08-01 ENCOUNTER — Emergency Department: Payer: Medicare Other

## 2018-08-01 ENCOUNTER — Other Ambulatory Visit: Payer: Self-pay

## 2018-08-01 DIAGNOSIS — I10 Essential (primary) hypertension: Secondary | ICD-10-CM | POA: Insufficient documentation

## 2018-08-01 DIAGNOSIS — F418 Other specified anxiety disorders: Secondary | ICD-10-CM | POA: Diagnosis not present

## 2018-08-01 DIAGNOSIS — Z87442 Personal history of urinary calculi: Secondary | ICD-10-CM | POA: Insufficient documentation

## 2018-08-01 DIAGNOSIS — Z791 Long term (current) use of non-steroidal anti-inflammatories (NSAID): Secondary | ICD-10-CM | POA: Diagnosis not present

## 2018-08-01 DIAGNOSIS — E1142 Type 2 diabetes mellitus with diabetic polyneuropathy: Secondary | ICD-10-CM | POA: Insufficient documentation

## 2018-08-01 DIAGNOSIS — Z7984 Long term (current) use of oral hypoglycemic drugs: Secondary | ICD-10-CM | POA: Insufficient documentation

## 2018-08-01 DIAGNOSIS — E876 Hypokalemia: Secondary | ICD-10-CM | POA: Diagnosis not present

## 2018-08-01 DIAGNOSIS — Y730 Diagnostic and monitoring gastroenterology and urology devices associated with adverse incidents: Secondary | ICD-10-CM | POA: Insufficient documentation

## 2018-08-01 DIAGNOSIS — Z79899 Other long term (current) drug therapy: Secondary | ICD-10-CM | POA: Diagnosis not present

## 2018-08-01 DIAGNOSIS — E7800 Pure hypercholesterolemia, unspecified: Secondary | ICD-10-CM | POA: Insufficient documentation

## 2018-08-01 DIAGNOSIS — K219 Gastro-esophageal reflux disease without esophagitis: Secondary | ICD-10-CM | POA: Diagnosis not present

## 2018-08-01 DIAGNOSIS — Z7902 Long term (current) use of antithrombotics/antiplatelets: Secondary | ICD-10-CM | POA: Insufficient documentation

## 2018-08-01 DIAGNOSIS — Z9889 Other specified postprocedural states: Secondary | ICD-10-CM | POA: Diagnosis not present

## 2018-08-01 DIAGNOSIS — F79 Unspecified intellectual disabilities: Secondary | ICD-10-CM | POA: Diagnosis not present

## 2018-08-01 DIAGNOSIS — Z793 Long term (current) use of hormonal contraceptives: Secondary | ICD-10-CM | POA: Insufficient documentation

## 2018-08-01 DIAGNOSIS — F410 Panic disorder [episodic paroxysmal anxiety] without agoraphobia: Secondary | ICD-10-CM | POA: Insufficient documentation

## 2018-08-01 DIAGNOSIS — N200 Calculus of kidney: Secondary | ICD-10-CM | POA: Insufficient documentation

## 2018-08-01 DIAGNOSIS — Y838 Other surgical procedures as the cause of abnormal reaction of the patient, or of later complication, without mention of misadventure at the time of the procedure: Secondary | ICD-10-CM | POA: Diagnosis not present

## 2018-08-01 DIAGNOSIS — Z803 Family history of malignant neoplasm of breast: Secondary | ICD-10-CM | POA: Diagnosis not present

## 2018-08-01 DIAGNOSIS — R1110 Vomiting, unspecified: Secondary | ICD-10-CM | POA: Diagnosis present

## 2018-08-01 DIAGNOSIS — N3000 Acute cystitis without hematuria: Secondary | ICD-10-CM | POA: Diagnosis not present

## 2018-08-01 DIAGNOSIS — Z23 Encounter for immunization: Secondary | ICD-10-CM | POA: Diagnosis not present

## 2018-08-01 DIAGNOSIS — K5730 Diverticulosis of large intestine without perforation or abscess without bleeding: Secondary | ICD-10-CM | POA: Diagnosis not present

## 2018-08-01 DIAGNOSIS — R112 Nausea with vomiting, unspecified: Secondary | ICD-10-CM | POA: Diagnosis present

## 2018-08-01 DIAGNOSIS — R197 Diarrhea, unspecified: Secondary | ICD-10-CM | POA: Diagnosis present

## 2018-08-01 DIAGNOSIS — R1310 Dysphagia, unspecified: Secondary | ICD-10-CM | POA: Insufficient documentation

## 2018-08-01 LAB — COMPREHENSIVE METABOLIC PANEL
ALK PHOS: 65 U/L (ref 38–126)
ALT: 19 U/L (ref 0–44)
AST: 16 U/L (ref 15–41)
Albumin: 4.6 g/dL (ref 3.5–5.0)
Anion gap: 13 (ref 5–15)
BILIRUBIN TOTAL: 0.7 mg/dL (ref 0.3–1.2)
BUN: 9 mg/dL (ref 6–20)
CALCIUM: 9.1 mg/dL (ref 8.9–10.3)
CO2: 23 mmol/L (ref 22–32)
CREATININE: 0.54 mg/dL (ref 0.44–1.00)
Chloride: 108 mmol/L (ref 98–111)
GFR calc Af Amer: >60 mL/min (ref >60)
Glucose, Bld: 218 mg/dL — ABNORMAL HIGH (ref 70–99)
Potassium: 3.8 mmol/L (ref 3.5–5.1)
Sodium: 144 mmol/L (ref 135–145)
TOTAL PROTEIN: 7.3 g/dL (ref 6.5–8.1)

## 2018-08-01 LAB — LIPASE, BLOOD: Lipase: 15 U/L (ref 11–51)

## 2018-08-01 LAB — CBC
HCT: 39.2 % (ref 35.0–47.0)
Hemoglobin: 13.3 g/dL (ref 12.0–16.0)
MCH: 29.2 pg (ref 26.0–34.0)
MCHC: 33.9 g/dL (ref 32.0–36.0)
MCV: 86.2 fL (ref 80.0–100.0)
PLATELETS: 336 10*3/uL (ref 150–440)
RBC: 4.55 MIL/uL (ref 3.80–5.20)
RDW: 14.5 % (ref 11.5–14.5)
WBC: 8.4 10*3/uL (ref 3.6–11.0)

## 2018-08-01 LAB — MRSA PCR SCREENING: MRSA by PCR: NEGATIVE

## 2018-08-01 MED ORDER — MONTELUKAST SODIUM 10 MG PO TABS
10.0000 mg | ORAL_TABLET | Freq: Every day | ORAL | Status: DC
  Administered 2018-08-03: 22:00:00 10 mg via ORAL
  Filled 2018-08-01 (×2): qty 1

## 2018-08-01 MED ORDER — LORATADINE 10 MG PO TABS
10.0000 mg | ORAL_TABLET | Freq: Every day | ORAL | Status: DC
  Administered 2018-08-04: 10 mg via ORAL
  Filled 2018-08-01: qty 1

## 2018-08-01 MED ORDER — MOMETASONE FURO-FORMOTEROL FUM 200-5 MCG/ACT IN AERO
2.0000 | INHALATION_SPRAY | Freq: Two times a day (BID) | RESPIRATORY_TRACT | Status: DC
  Administered 2018-08-01 – 2018-08-04 (×6): 2 via RESPIRATORY_TRACT
  Filled 2018-08-01: qty 8.8

## 2018-08-01 MED ORDER — VITAMIN D 1000 UNITS PO TABS
1000.0000 [IU] | ORAL_TABLET | Freq: Every day | ORAL | Status: DC
  Administered 2018-08-04: 10:00:00 1000 [IU] via ORAL
  Filled 2018-08-01: qty 1

## 2018-08-01 MED ORDER — CITALOPRAM HYDROBROMIDE 20 MG PO TABS
20.0000 mg | ORAL_TABLET | Freq: Every day | ORAL | Status: DC
  Administered 2018-08-04: 10:00:00 20 mg via ORAL
  Filled 2018-08-01: qty 1

## 2018-08-01 MED ORDER — SODIUM CHLORIDE 0.9 % IV SOLN
INTRAVENOUS | Status: DC
  Administered 2018-08-01 – 2018-08-02 (×2): via INTRAVENOUS

## 2018-08-01 MED ORDER — ONDANSETRON HCL 4 MG/2ML IJ SOLN
4.0000 mg | Freq: Four times a day (QID) | INTRAMUSCULAR | Status: DC
  Administered 2018-08-01 – 2018-08-02 (×2): 4 mg via INTRAVENOUS
  Filled 2018-08-01 (×2): qty 2

## 2018-08-01 MED ORDER — LINACLOTIDE 145 MCG PO CAPS
145.0000 ug | ORAL_CAPSULE | Freq: Every day | ORAL | Status: DC
  Administered 2018-08-04: 145 ug via ORAL
  Filled 2018-08-01 (×3): qty 1

## 2018-08-01 MED ORDER — LORAZEPAM 1 MG PO TABS: 1.0000 mg | ORAL_TABLET | Freq: Three times a day (TID) | ORAL | Status: DC | PRN

## 2018-08-01 MED ORDER — BUSPIRONE HCL 15 MG PO TABS
30.0000 mg | ORAL_TABLET | Freq: Two times a day (BID) | ORAL | Status: DC
  Administered 2018-08-03 – 2018-08-04 (×2): 30 mg via ORAL
  Filled 2018-08-01 (×7): qty 2

## 2018-08-01 MED ORDER — SODIUM CHLORIDE 0.9 % IV BOLUS
1000.0000 mL | Freq: Once | INTRAVENOUS | Status: AC
  Administered 2018-08-01: 1000 mL via INTRAVENOUS

## 2018-08-01 MED ORDER — ALBUTEROL SULFATE (2.5 MG/3ML) 0.083% IN NEBU: 2.5000 mg | INHALATION_SOLUTION | Freq: Four times a day (QID) | RESPIRATORY_TRACT | Status: DC | PRN

## 2018-08-01 MED ORDER — GABAPENTIN 300 MG PO CAPS
400.0000 mg | ORAL_CAPSULE | Freq: Three times a day (TID) | ORAL | Status: DC
  Administered 2018-08-03 – 2018-08-04 (×2): 400 mg via ORAL
  Filled 2018-08-01 (×3): qty 1

## 2018-08-01 MED ORDER — ONDANSETRON 4 MG PO TBDP: 4.0000 mg | ORAL_TABLET | Freq: Three times a day (TID) | ORAL | 0 refills | Status: DC | PRN

## 2018-08-01 MED ORDER — PANTOPRAZOLE SODIUM 40 MG PO TBEC
40.0000 mg | DELAYED_RELEASE_TABLET | Freq: Every day | ORAL | Status: DC
  Administered 2018-08-04: 10:00:00 40 mg via ORAL
  Filled 2018-08-01: qty 1

## 2018-08-01 MED ORDER — ACETAMINOPHEN 650 MG RE SUPP: 650.0000 mg | Freq: Four times a day (QID) | RECTAL | Status: DC | PRN

## 2018-08-01 MED ORDER — ACETAMINOPHEN 325 MG PO TABS
650.0000 mg | ORAL_TABLET | Freq: Four times a day (QID) | ORAL | Status: DC | PRN
  Administered 2018-08-02: 650 mg via ORAL
  Filled 2018-08-01: qty 2

## 2018-08-01 MED ORDER — PROMETHAZINE HCL 25 MG/ML IJ SOLN
25.0000 mg | Freq: Four times a day (QID) | INTRAMUSCULAR | Status: DC | PRN
  Administered 2018-08-02 – 2018-08-04 (×4): 25 mg via INTRAVENOUS
  Filled 2018-08-01 (×4): qty 1

## 2018-08-01 MED ORDER — ONDANSETRON HCL 4 MG/2ML IJ SOLN
4.0000 mg | Freq: Once | INTRAMUSCULAR | Status: AC
  Administered 2018-08-01: 4 mg via INTRAVENOUS
  Filled 2018-08-01: qty 2

## 2018-08-01 MED ORDER — BUPROPION HCL ER (SR) 100 MG PO TB12
100.0000 mg | ORAL_TABLET | Freq: Two times a day (BID) | ORAL | Status: DC
  Administered 2018-08-03 – 2018-08-04 (×2): 100 mg via ORAL
  Filled 2018-08-01 (×7): qty 1

## 2018-08-01 MED ORDER — ONDANSETRON 4 MG PO TBDP
4.0000 mg | ORAL_TABLET | Freq: Once | ORAL | Status: AC
  Administered 2018-08-01: 4 mg via ORAL
  Filled 2018-08-01: qty 1

## 2018-08-01 MED ORDER — ENOXAPARIN SODIUM 40 MG/0.4ML ~~LOC~~ SOLN
40.0000 mg | SUBCUTANEOUS | Status: DC
  Administered 2018-08-01 – 2018-08-03 (×3): 40 mg via SUBCUTANEOUS
  Filled 2018-08-01 (×3): qty 0.4

## 2018-08-01 MED ORDER — METOCLOPRAMIDE HCL 5 MG/ML IJ SOLN
10.0000 mg | Freq: Once | INTRAMUSCULAR | Status: AC
  Administered 2018-08-01: 10 mg via INTRAVENOUS
  Filled 2018-08-01: qty 2

## 2018-08-01 MED ORDER — PNEUMOCOCCAL VAC POLYVALENT 25 MCG/0.5ML IJ INJ
0.5000 mL | INJECTION | INTRAMUSCULAR | Status: AC
  Administered 2018-08-04: 12:00:00 0.5 mL via INTRAMUSCULAR
  Filled 2018-08-01: qty 0.5

## 2018-08-01 MED ORDER — SIMVASTATIN 20 MG PO TABS
20.0000 mg | ORAL_TABLET | Freq: Every day | ORAL | Status: DC
  Administered 2018-08-04: 10:00:00 20 mg via ORAL
  Filled 2018-08-01: qty 1

## 2018-08-01 MED ORDER — CLOPIDOGREL BISULFATE 75 MG PO TABS
75.0000 mg | ORAL_TABLET | Freq: Every day | ORAL | Status: DC
  Administered 2018-08-04: 10:00:00 75 mg via ORAL
  Filled 2018-08-01 (×2): qty 1

## 2018-08-01 NOTE — ED Notes (Signed)
This RN to bedside, pt states she was not able to keep ginger ale down, no signs of vomiting noted at this time, and patient has not vomited since being given the ginger ale. Pt started yelling at this RN, "tell him I can't go home, just tell him I can't go home!"

## 2018-08-01 NOTE — ED Notes (Signed)
Patient transported to X-ray 

## 2018-08-01 NOTE — ED Triage Notes (Signed)
PT to ED reporting vomiting since 7am today. Pt had a endoscopy performed yesterday to biopsy polyps in her stomach. No blood has been seen in vomit. Diarrhea also started today. No fevers.

## 2018-08-01 NOTE — ED Triage Notes (Signed)
First nurse:  Upper endoscopy yesterday.  Since 7pm she has had vomiting and diarrhea.  Has vomit bag over face now.

## 2018-08-01 NOTE — ED Notes (Signed)
Pt given ginger ale to drink. 

## 2018-08-01 NOTE — ED Provider Notes (Addendum)
[facility] Emergency Department Provider Note  Time seen: 2:25 PM  I have reviewed the triage vital signs and the nursing notes.   HISTORY  Chief Complaint Emesis    HPI Hannah Clarke is a 52 y.o. female with a past medical history of mental delay, anxiety, diabetes, depression, hypertension and hyperlipidemia presents to the emergency department for nausea vomiting diarrhea.  According to the patient and group home caregiver patient had an endoscopy performed yesterday.  She states shortly after returning home after the endoscopy she began with nausea vomiting and diarrhea.  Awoke this morning with continued nausea vomiting and diarrhea.  Estimates her last episode of emesis was approximately 10 AM last episode of diarrhea was just before that.  Still complains of nausea and states she was feeling dizzy today as well.  Patient does admit she feels somewhat better after the medications but still feels somewhat nauseated.  Patient denies any abdominal pain.  No chest pain.  Largely negative review of systems otherwise.   Past Medical History:  Diagnosis Date  . Anxiety   . Chronic headaches   . Depression   . Dermatophytosis of nail   . Diabetes mellitus without complication ([place])   . GERD (gastroesophageal reflux disease)   . High cholesterol   . Hypertension   . Mental developmental delay   . Onychia of toe   . Panic attacks     Patient Active Problem List   Diagnosis Date Noted  . Unstable angina ([place]) 08/17/2017    Past Surgical History:  Procedure Laterality Date  . COLONOSCOPY WITH PROPOFOL N/A 10/09/2017   Procedure: COLONOSCOPY WITH PROPOFOL;  Surgeon: Toledo, Benay Pike, MD;  Location: [facility] ENDOSCOPY;  Service: Gastroenterology;  Laterality: N/A;  . COLONOSCOPY WITH PROPOFOL N/A 02/06/2018   Procedure: COLONOSCOPY WITH PROPOFOL;  Surgeon: Toledo, Benay Pike, MD;  Location: [facility] ENDOSCOPY;  Service: Gastroenterology;  Laterality: N/A;  .  ESOPHAGOGASTRODUODENOSCOPY (EGD) WITH PROPOFOL N/A 10/09/2017   Procedure: ESOPHAGOGASTRODUODENOSCOPY (EGD) WITH PROPOFOL;  Surgeon: Toledo, Benay Pike, MD;  Location: [facility] ENDOSCOPY;  Service: Gastroenterology;  Laterality: N/A;  . ESOPHAGOGASTRODUODENOSCOPY (EGD) WITH PROPOFOL N/A 07/31/2018   Procedure: ESOPHAGOGASTRODUODENOSCOPY (EGD) WITH PROPOFOL;  Surgeon: Toledo, Benay Pike, MD;  Location: [facility] ENDOSCOPY;  Service: Gastroenterology;  Laterality: N/A;  . HAND SURGERY    . KIDNEY STONE SURGERY      Prior to Admission medications   Medication Sig Start Date End Date Taking? Authorizing Provider  acetaminophen (TYLENOL) 325 MG tablet Take 325 mg by mouth every 6 (six) hours as needed.    [provider]  albuterol (PROVENTIL HFA;VENTOLIN HFA) 108 (90 BASE) MCG/ACT inhaler Inhale 2 puffs into the lungs every 6 (six) hours as needed for wheezing or shortness of breath. 11/07/15   Orbie Pyo, MD  amLODipine (NORVASC) 5 MG tablet Take 5 mg by mouth daily.    [provider]  amoxicillin (AMOXIL) 875 MG tablet Take 1 tablet (875 mg total) by mouth 2 (two) times daily. 11/28/17   Fisher, Linden Dolin, PA-C  benzonatate (TESSALON) 100 MG capsule Take 100 mg by mouth 3 (three) times daily as needed for cough.    [provider]  brompheniramine-pseudoephedrine-DM 30-2-10 MG/5ML syrup Take 10 mLs by mouth 4 (four) times daily as needed. 10/24/17   Cuthriell, Charline Bills, PA-C  buPROPion (WELLBUTRIN SR) 100 MG 12 hr tablet Take 100 mg by mouth 2 (two) times daily.    [provider]  busPIRone (BUSPAR) 30  MG tablet Take 30 mg by mouth 2 (two) times daily.    [provider]  cetirizine (ZYRTEC) 10 MG tablet Take 10 mg by mouth daily.    [provider]  cholecalciferol (VITAMIN D) 1000 units tablet Take 1,000 Units by mouth daily.    [provider]  citalopram (CELEXA) 20 MG tablet Take 20 mg by mouth daily.    [provider]   clopidogrel (PLAVIX) 75 MG tablet Take 75 mg by mouth daily.    [provider]  dexlansoprazole (DEXILANT) 60 MG capsule Take 60 mg by mouth daily.    [provider]  fluticasone (FLONASE) 50 MCG/ACT nasal spray Place 1 spray into both nostrils daily.    [provider]  Fluticasone-Salmeterol (ADVAIR) 250-50 MCG/DOSE AEPB Inhale 1 puff into the lungs 2 (two) times daily.    [provider]  gabapentin (NEURONTIN) 400 MG capsule Take 400 mg by mouth 3 (three) times daily.    [provider]  ibuprofen (ADVIL,MOTRIN) 600 MG tablet Take 1 tablet (600 mg total) by mouth every 6 (six) hours as needed. 01/23/17   Triplett, Dessa Phi, FNP  linaclotide (LINZESS) 145 MCG CAPS capsule Take 145 mcg by mouth daily before breakfast.    [provider]  LORazepam (ATIVAN) 1 MG tablet Take 1 tablet (1 mg total) by mouth every 8 (eight) hours as needed for anxiety. 02/03/16   Paulette Blanch, MD  losartan (COZAAR) 100 MG tablet Take 100 mg by mouth daily.    [provider]  medroxyPROGESTERone (DEPO-PROVERA) 150 MG/ML injection Inject 150 mg into the muscle every 3 (three) months.    [provider]  meloxicam (MOBIC) 15 MG tablet Take 1 tablet (15 mg total) by mouth daily. 10/24/17   Cuthriell, Charline Bills, PA-C  metFORMIN (GLUCOPHAGE) 500 MG tablet Take 500 mg by mouth 2 (two) times daily with a meal.    [provider]  montelukast (SINGULAIR) 10 MG tablet Take 10 mg at bedtime by mouth.    [provider]  omega-3 acid ethyl esters (LOVAZA) 1 g capsule Take 1 g 2 (two) times daily by mouth.    [provider]  ondansetron (ZOFRAN) 4 MG tablet Take 1 tablet (4 mg total) by mouth daily as needed for nausea or vomiting. 09/18/17 09/18/18  [name], MD  ondansetron (ZOFRAN) 8 MG tablet Take 8 mg by mouth every 8 (eight) hours as needed for nausea or vomiting.    [provider]  polyethylene glycol  (MIRALAX / GLYCOLAX) packet Take 17 g by mouth every other day.    [provider]  propranolol (INDERAL) 40 MG tablet Take 20-40 mg by mouth 2 (two) times daily. Take 40 mg in the am and 20 mg at bedtime    [provider]  sennosides-docusate sodium (SENOKOT-S) 8.6-50 MG tablet Take 1 tablet by mouth daily.    [provider]  simvastatin (ZOCOR) 20 MG tablet Take 20 mg by mouth daily.    [provider]  vortioxetine HBr (TRINTELLIX) 10 MG TABS Take 10 mg daily by mouth.    [provider]    No Known Allergies  Family History  Problem Relation Age of Onset  . Breast cancer Mother 40       pre pt.     Social History Social History   Tobacco Use  . Smoking status: Never Smoker  . Smokeless tobacco: Never Used  Substance Use Topics  .  Alcohol use: Yes    Comment: OCCASSIONALLY  . Drug use: No    Review of Systems Constitutional: Negative for fever. Cardiovascular: Negative for chest pain. Respiratory: Negative for shortness of breath. Gastrointestinal: Negative for abdominal pain.  Positive for nausea vomiting diarrhea Genitourinary: Negative for urinary compaints Musculoskeletal: Negative for musculoskeletal complaints Skin: Negative for skin complaints  Neurological: Negative for headache All other ROS negative, although possibly limited due to mental delay.  ____________________________________________   PHYSICAL EXAM:  VITAL SIGNS: ED Triage Vitals  Enc Vitals Group     BP 08/01/18 1236 (!) 147/87     Pulse Rate 08/01/18 1236 71     Resp 08/01/18 1236 16     Temp 08/01/18 1236 98.1 F (36.7 C)     Temp Source 08/01/18 1236 Oral     SpO2 08/01/18 1236 98 %     Weight 08/01/18 1236 178 lb (80.7 kg)     Height 08/01/18 1236 5\' 2"  (1.575 m)     Head Circumference --      Peak Flow --      Pain Score 08/01/18 1240 10     Pain Loc --      Pain Edu? --      Excl. in [place]? --     Constitutional: Alert, curled up in  bed with blankets on.  No acute distress. Eyes: Normal exam ENT   Head: Normocephalic and atraumatic.   Mouth/Throat: Mucous membranes are moist. Cardiovascular: Normal rate, regular rhythm. No murmur Respiratory: Normal respiratory effort without tachypnea nor retractions. Breath sounds are clear  Gastrointestinal: Soft and nontender.  Slight distention but dull percussion. Musculoskeletal: Nontender with normal range of motion in all extremities.  Neurologic:  Normal speech and language. No gross focal neurologic deficits  Skin:  Skin is warm, dry and intact.  Psychiatric: Mood and affect are normal.   ____________________________________________    EKG  EKG reviewed and interpreted by myself shows sinus bradycardia 58 bpm with a narrow QRS, normal axis, normal intervals, no concerning ST changes.  ____________________________________________    RADIOLOGY  X-ray negative for acute abnormality  ____________________________________________   INITIAL IMPRESSION / ASSESSMENT AND PLAN / ED COURSE  Pertinent labs & imaging results that were available during my care of the patient were reviewed by me and considered in my medical decision making (see chart for details).  Patient presents emergency department for nausea vomiting since last night.  Last episode of vomiting and diarrhea was greater than 4 hours ago.  We will treat the patient's nausea, IV hydrate.  Patient's lab work is reassuring including a normal CBC and metal Bolick panel besides slight hyperglycemia.  Given her slight distention although otherwise benign abdomen nontender to palpation we will obtain a two-view x-ray of the abdomen as a precaution.  Patient agreeable to plan of care.  I suspect the patient's symptoms could be related to anesthesia from yesterday, could be gastroenteritis, as the patient has a pain-free abdominal exam and not complaining of chest or abdominal pain highly doubt perforation.  X-ray  negative.  Labs are reassuring.  Patient feeling better after fluids and Reglan.  We will discharge with Zofran to be used as needed.  Patient and caregiver agreeable to this plan of care.  Patient continues to state significant nausea, holding an emesis bag.  States she cannot go home states she is too dizzy to go home anytime she stands up she is nauseated.  Has not vomited in the emergency department  however due to intractable nausea patient will be admitted to the hospitalist service. ____________________________________________   FINAL CLINICAL IMPRESSION(S) / ED DIAGNOSES  Nausea vomiting diarrhea    Harvest Dark, MD 08/01/18 1526    Harvest Dark, MD 08/01/18 [phone]

## 2018-08-01 NOTE — Progress Notes (Signed)
Received a verbal order per MD Gouru for NPO except medications if pt able to tolerate.

## 2018-08-01 NOTE — H&P (Signed)
[facility] [place] NAME: Hannah Clarke    MR#:  [id number]  DATE OF BIRTH:  11-16-1966  DATE OF ADMISSION:  08/01/2018  PRIMARY CARE PHYSICIAN: Danelle Berry, NP   REQUESTING/REFERRING PHYSICIAN: Dr. Harvest Dark  CHIEF COMPLAINT:   Chief Complaint  Patient presents with  . Emesis    HISTORY OF PRESENT ILLNESS:  Hannah Clarke  is a 52 y.o. female with a known history of Anxiety, depression, diabetes, GERD, hypertension, hyperlipidemia, mental retardation/developmental delay, history of panic attacks who presents to the hospital due to intractable nausea and vomiting.  Patient apparently has been having some intermittent dysphasia and was evaluated by gastroenterology as an outpatient and underwent an upper GI endoscopy yesterday which showed no acute pathology and no obstruction.  Since her endoscopy patient apparently has been having intractable nausea and vomiting and therefore came to the ER.  She has been given multiple doses of antiemetics without much improvement in her symptoms.  Clinically she does not appear to be dehydrated and her electrolytes are stable.  Hospitalist services were contacted for admission due to her persistent symptoms.  Patient does have developmental delay/mental retardation therefore it is unclear whether her symptoms are true but she is sitting in the side of the bed with a vomit bag and continues to complain of nausea.  PAST MEDICAL HISTORY:   Past Medical History:  Diagnosis Date  . Anxiety   . Chronic headaches   . Depression   . Dermatophytosis of nail   . Diabetes mellitus without complication ([place])   . GERD (gastroesophageal reflux disease)   . High cholesterol   . Hypertension   . Mental developmental delay   . Onychia of toe   . Panic attacks     PAST SURGICAL HISTORY:   Past Surgical History:  Procedure Laterality Date  . COLONOSCOPY WITH PROPOFOL N/A 10/09/2017   Procedure:  COLONOSCOPY WITH PROPOFOL;  Surgeon: Toledo, Benay Pike, MD;  Location: [facility] ENDOSCOPY;  Service: Gastroenterology;  Laterality: N/A;  . COLONOSCOPY WITH PROPOFOL N/A 02/06/2018   Procedure: COLONOSCOPY WITH PROPOFOL;  Surgeon: Toledo, Benay Pike, MD;  Location: [facility] ENDOSCOPY;  Service: Gastroenterology;  Laterality: N/A;  . ESOPHAGOGASTRODUODENOSCOPY (EGD) WITH PROPOFOL N/A 10/09/2017   Procedure: ESOPHAGOGASTRODUODENOSCOPY (EGD) WITH PROPOFOL;  Surgeon: Toledo, Benay Pike, MD;  Location: [facility] ENDOSCOPY;  Service: Gastroenterology;  Laterality: N/A;  . ESOPHAGOGASTRODUODENOSCOPY (EGD) WITH PROPOFOL N/A 07/31/2018   Procedure: ESOPHAGOGASTRODUODENOSCOPY (EGD) WITH PROPOFOL;  Surgeon: Toledo, Benay Pike, MD;  Location: [facility] ENDOSCOPY;  Service: Gastroenterology;  Laterality: N/A;  . HAND SURGERY    . KIDNEY STONE SURGERY      SOCIAL HISTORY:   Social History   Tobacco Use  . Smoking status: Never Smoker  . Smokeless tobacco: Never Used  Substance Use Topics  . Alcohol use: Yes    Comment: OCCASSIONALLY    FAMILY HISTORY:   Family History  Problem Relation Age of Onset  . Breast cancer Mother 65       pre pt.     DRUG ALLERGIES:  No Known Allergies  REVIEW OF SYSTEMS:   Review of Systems  Constitutional: Negative for fever and weight loss.  HENT: Negative for congestion, nosebleeds and tinnitus.   Eyes: Negative for blurred vision, double vision and redness.  Respiratory: Negative for cough, hemoptysis and shortness of breath.   Cardiovascular: Negative for chest pain, orthopnea, leg swelling and PND.  Gastrointestinal: Positive for nausea and vomiting. Negative for  abdominal pain, diarrhea and melena.  Genitourinary: Negative for dysuria, hematuria and urgency.  Musculoskeletal: Negative for falls and joint pain.  Neurological: Negative for dizziness, tingling, sensory change, focal weakness, seizures, weakness and headaches.  Endo/Heme/Allergies: Negative for polydipsia. Does  not bruise/bleed easily.  Psychiatric/Behavioral: Negative for depression and memory loss. The patient is not nervous/anxious.     MEDICATIONS AT HOME:   Prior to Admission medications   Medication Sig Start Date End Date Taking? Authorizing Provider  acetaminophen (TYLENOL) 325 MG tablet Take 325 mg by mouth every 6 (six) hours as needed.    [provider]  albuterol (PROVENTIL HFA;VENTOLIN HFA) 108 (90 BASE) MCG/ACT inhaler Inhale 2 puffs into the lungs every 6 (six) hours as needed for wheezing or shortness of breath. 11/07/15   Orbie Pyo, MD  amLODipine (NORVASC) 5 MG tablet Take 5 mg by mouth daily.    [provider]  amoxicillin (AMOXIL) 875 MG tablet Take 1 tablet (875 mg total) by mouth 2 (two) times daily. 11/28/17   Fisher, Linden Dolin, PA-C  benzonatate (TESSALON) 100 MG capsule Take 100 mg by mouth 3 (three) times daily as needed for cough.    [provider]  brompheniramine-pseudoephedrine-DM 30-2-10 MG/5ML syrup Take 10 mLs by mouth 4 (four) times daily as needed. 10/24/17   Cuthriell, Charline Bills, PA-C  buPROPion (WELLBUTRIN SR) 100 MG 12 hr tablet Take 100 mg by mouth 2 (two) times daily.    [provider]  busPIRone (BUSPAR) 30 MG tablet Take 30 mg by mouth 2 (two) times daily.    [provider]  cetirizine (ZYRTEC) 10 MG tablet Take 10 mg by mouth daily.    [provider]  cholecalciferol (VITAMIN D) 1000 units tablet Take 1,000 Units by mouth daily.    [provider]  citalopram (CELEXA) 20 MG tablet Take 20 mg by mouth daily.    [provider]  clopidogrel (PLAVIX) 75 MG tablet Take 75 mg by mouth daily.    [provider]  dexlansoprazole (DEXILANT) 60 MG capsule Take 60 mg by mouth daily.    [provider]  fluticasone (FLONASE) 50 MCG/ACT nasal spray Place 1 spray into both nostrils daily.    [provider]  Fluticasone-Salmeterol (ADVAIR) 250-50 MCG/DOSE  AEPB Inhale 1 puff into the lungs 2 (two) times daily.    [provider]  gabapentin (NEURONTIN) 400 MG capsule Take 400 mg by mouth 3 (three) times daily.    [provider]  ibuprofen (ADVIL,MOTRIN) 600 MG tablet Take 1 tablet (600 mg total) by mouth every 6 (six) hours as needed. 01/23/17   Triplett, Dessa Phi, FNP  linaclotide (LINZESS) 145 MCG CAPS capsule Take 145 mcg by mouth daily before breakfast.    [provider]  LORazepam (ATIVAN) 1 MG tablet Take 1 tablet (1 mg total) by mouth every 8 (eight) hours as needed for anxiety. 02/03/16   Paulette Blanch, MD  losartan (COZAAR) 100 MG tablet Take 100 mg by mouth daily.    [provider]  medroxyPROGESTERone (DEPO-PROVERA) 150 MG/ML injection Inject 150 mg into the muscle every 3 (three) months.    [provider]  meloxicam (MOBIC) 15 MG tablet Take 1 tablet (15 mg total) by mouth daily. 10/24/17   Cuthriell, Charline Bills, PA-C  metFORMIN (GLUCOPHAGE) 500 MG tablet Take 500 mg by mouth 2 (two) times daily with a meal.    [provider]  montelukast (SINGULAIR) 10 MG tablet  Take 10 mg at bedtime by mouth.    [provider]  omega-3 acid ethyl esters (LOVAZA) 1 g capsule Take 1 g 2 (two) times daily by mouth.    [provider]  ondansetron (ZOFRAN ODT) 4 MG disintegrating tablet Take 1 tablet (4 mg total) by mouth every 8 (eight) hours as needed for nausea or vomiting. 08/01/18   Harvest Dark, MD  ondansetron (ZOFRAN) 4 MG tablet Take 1 tablet (4 mg total) by mouth daily as needed for nausea or vomiting. 09/18/17 09/18/18  [name], MD  ondansetron (ZOFRAN) 8 MG tablet Take 8 mg by mouth every 8 (eight) hours as needed for nausea or vomiting.    [provider]  polyethylene glycol (MIRALAX / GLYCOLAX) packet Take 17 g by mouth every other day.    [provider]  propranolol (INDERAL) 40 MG tablet Take 20-40 mg by mouth 2 (two) times daily. Take 40  mg in the am and 20 mg at bedtime    [provider]  sennosides-docusate sodium (SENOKOT-S) 8.6-50 MG tablet Take 1 tablet by mouth daily.    [provider]  simvastatin (ZOCOR) 20 MG tablet Take 20 mg by mouth daily.    [provider]  vortioxetine HBr (TRINTELLIX) 10 MG TABS Take 10 mg daily by mouth.    [provider]      VITAL SIGNS:  Blood pressure (!) 143/77, pulse 86, temperature 98.1 F (36.7 C), temperature source Oral, resp. rate 18, height 5\' 2"  (1.575 m), weight 80.7 kg, SpO2 98 %.  PHYSICAL EXAMINATION:  Physical Exam  GENERAL:  52 y.o.-year-old patient lying in the bed in mild GI distress.  EYES: Pupils equal, round, reactive to light and accommodation. No scleral icterus. Extraocular muscles intact.  HEENT: Head atraumatic, normocephalic. Oropharynx and nasopharynx clear. No oropharyngeal erythema, moist oral mucosa  NECK:  Supple, no jugular venous distention. No thyroid enlargement, no tenderness.  LUNGS: Normal breath sounds bilaterally, no wheezing, rales, rhonchi. No use of accessory muscles of respiration.  CARDIOVASCULAR: S1, S2 RRR. No murmurs, rubs, gallops, clicks.  ABDOMEN: Soft, nontender, nondistended. Bowel sounds present. No organomegaly or mass.  EXTREMITIES: No pedal edema, cyanosis, or clubbing. + 2 pedal & radial pulses b/l.   NEUROLOGIC: Cranial nerves II through XII are intact. No focal Motor or sensory deficits appreciated b/l.  PSYCHIATRIC: The patient is alert and oriented x 3.   SKIN: No obvious rash, lesion, or ulcer.   LABORATORY PANEL:   CBC Recent Labs  Lab 08/01/18 1249  WBC 8.4  HGB 13.3  HCT 39.2  PLT 336   ------------------------------------------------------------------------------------------------------------------  Chemistries  Recent Labs  Lab 08/01/18 1249  NA 144  K 3.8  CL 108  CO2 23  GLUCOSE 218*  BUN 9  CREATININE 0.54  CALCIUM 9.1  AST 16  ALT 19  ALKPHOS 65   BILITOT 0.7   ------------------------------------------------------------------------------------------------------------------  Cardiac Enzymes No results for input(s): TROPONINI in the last 168 hours. ------------------------------------------------------------------------------------------------------------------  RADIOLOGY:  Dg Abd 2 Views  Result Date: 08/01/2018 CLINICAL DATA:  Patient had upper endoscopy yesterday and last evening experienced vomiting and diarrhea. EXAM: ABDOMEN - 2 VIEW COMPARISON:  CT AP 06/12/2016 FINDINGS: Contrast filled sigmoid diverticula. No bowel obstruction or free air. No organomegaly. Redemonstration of left-sided nephrolithiasis projecting over the lower pole of the left kidney measuring 5 mm. No acute osseous abnormality. IMPRESSION: 1. Nonobstructed, nondistended bowel gas pattern. 2. Left-sided lower pole renal calculus measuring 5 mm  is noted. 3. There is colonic diverticulosis. Electronically Signed   By: Ashley Royalty M.D.   On: 08/01/2018 15:06     IMPRESSION AND PLAN:   51 year old female with Past Medical history of Anxiety, depression, diabetes, GERD, hypertension, hyperlipidemia, mental retardation/developmental delay, history of panic attacks who presents to the hospital due to intractable nausea and vomiting.  1.  Intractable nausea and vomiting-etiology unclear.  Patient has had an extensive gastroenterology work-up including endoscopy yesterday as an outpatient also a abdominal x-ray series here in the hospital which showed no acute pathology and no obstruction. - Patient continues to have persistent symptoms and its unclear what they are coming from.  Patient is not the best historian given her mental retardation.  Will observe overnight in the hospital give her schedule antiemetics continue IV fluids, keep her n.p.o. for now.  2.  History of neuropathy-continue gabapentin.  3.  Essential hypertension- continue Norvasc  4.   Anxiety/depression-continue Celexa, BuSpar, bupropion, Linzess. -Continue as needed Ativan.  5.  COPD/asthma- no acute exacerbation. -Continue albuterol inhaler as needed, continue Dulera.  6.  GERD-continue Protonix.  7.  Hyperlipidemia-continue simvastatin.  Patient's med rec by pharmacy is not complete as patient cannot provide her med list and she has developmental delay/mental retardation, patient's mom at bedside is not her power of attorney.  Pharmacy tech likely needs to call patient's pharmacy which is [facility] he will drug tomorrow to complete her med rec.  I have resumed some of her meds based on her recent outpatient note as of early August.   All the records are reviewed and case discussed with ED provider. Management plans discussed with the patient, family and they are in agreement.  CODE STATUS: Full code  TOTAL TIME TAKING CARE OF THIS PATIENT: 40 minutes.    Henreitta Leber M.D on 08/01/2018 at 6:32 PM  Between 7am to 6pm - Pager - [phone]  After 6pm go to [email or web address] - password [email or web address] [facility]  [facility]  Office  [phone]  CC: Primary care physician; Danelle Berry, NP

## 2018-08-01 NOTE — ED Notes (Signed)
Pt presents to ED via POV with [facility] home caregiver. Per caregiver pt had upper endoscopy yesterday, last night began having vomiting and diarrhea, caregiver denies blood in vomit at this time. Pt c/o continued nausea at this time.

## 2018-08-02 DIAGNOSIS — R112 Nausea with vomiting, unspecified: Secondary | ICD-10-CM | POA: Diagnosis not present

## 2018-08-02 LAB — HEMOGLOBIN A1C
Hgb A1c MFr Bld: 6.6 % — ABNORMAL HIGH (ref 4.8–5.6)
Mean Plasma Glucose: 142.72 mg/dL

## 2018-08-02 LAB — BASIC METABOLIC PANEL
Anion gap: 6 (ref 5–15)
BUN: 9 mg/dL (ref 6–20)
CO2: 24 mmol/L (ref 22–32)
Calcium: 8.1 mg/dL — ABNORMAL LOW (ref 8.9–10.3)
Chloride: 114 mmol/L — ABNORMAL HIGH (ref 98–111)
Creatinine, Ser: 0.43 mg/dL — ABNORMAL LOW (ref 0.44–1.00)
GFR calc Af Amer: >60 mL/min (ref >60)
GFR calc non Af Amer: >60 mL/min (ref >60)
Glucose, Bld: 151 mg/dL — ABNORMAL HIGH (ref 70–99)
Potassium: 2.8 mmol/L — ABNORMAL LOW (ref 3.5–5.1)
Sodium: 144 mmol/L (ref 135–145)

## 2018-08-02 LAB — SURGICAL PATHOLOGY

## 2018-08-02 LAB — GLUCOSE, CAPILLARY
GLUCOSE-CAPILLARY: 131 mg/dL — AB (ref 70–99)
Glucose-Capillary: 123 mg/dL — ABNORMAL HIGH (ref 70–99)

## 2018-08-02 MED ORDER — INSULIN ASPART 100 UNIT/ML ~~LOC~~ SOLN: 0.0000 [IU] | Freq: Every day | SUBCUTANEOUS | Status: DC

## 2018-08-02 MED ORDER — POTASSIUM CHLORIDE IN NACL 20-0.9 MEQ/L-% IV SOLN
INTRAVENOUS | Status: DC
  Administered 2018-08-02 – 2018-08-04 (×3): via INTRAVENOUS
  Filled 2018-08-02 (×6): qty 1000

## 2018-08-02 MED ORDER — POTASSIUM CHLORIDE 10 MEQ/100ML IV SOLN
10.0000 meq | INTRAVENOUS | Status: AC
  Administered 2018-08-02 (×6): 10 meq via INTRAVENOUS
  Filled 2018-08-02 (×6): qty 100

## 2018-08-02 MED ORDER — METOCLOPRAMIDE HCL 5 MG/ML IJ SOLN
10.0000 mg | Freq: Four times a day (QID) | INTRAMUSCULAR | Status: DC
  Administered 2018-08-02 – 2018-08-04 (×8): 10 mg via INTRAVENOUS
  Filled 2018-08-02 (×8): qty 2

## 2018-08-02 MED ORDER — INSULIN ASPART 100 UNIT/ML ~~LOC~~ SOLN
0.0000 [IU] | Freq: Three times a day (TID) | SUBCUTANEOUS | Status: DC
  Administered 2018-08-03: 13:00:00 2 [IU] via SUBCUTANEOUS
  Administered 2018-08-03 – 2018-08-04 (×3): 1 [IU] via SUBCUTANEOUS
  Filled 2018-08-02 (×2): qty 1

## 2018-08-02 NOTE — Progress Notes (Signed)
Inpatient Diabetes Program Recommendations  AACE/ADA: New Consensus Statement on Inpatient Glycemic Control (2015)  Target Ranges:  Prepandial:   less than 140 mg/dL      Peak postprandial:   less than 180 mg/dL (1-2 hours)      Critically ill patients:  140 - 180 mg/dL   Results for Hannah Clarke, Hannah Clarke (MRN [id number]) as of 08/02/2018 06:56  Ref. Range 08/01/2018 12:49 08/02/2018 04:19  Glucose Latest Ref Range: 70 - 99 mg/dL 218 (H) 151 (H)    Admit with: Intractable nausea and vomiting  History: DM, Developmental delay/Mental retardation  Home DM Meds: Metformin 500 mg BID  Current Insulin Orders: None      MD- Please consider adding orders for Novolog Sensitive Correction Scale/ SSI (0-9 units) Q4 hours      --Will follow patient during hospitalization--  Wyn Quaker RN, MSN, CDE Diabetes Coordinator Inpatient Glycemic Control Team Team Pager: [phone] (8a-5p)

## 2018-08-02 NOTE — Clinical Social Work Note (Addendum)
Clinical Social Work Assessment  Patient Details  Name: Hannah Clarke MRN: [id number] Date of Birth: 1965/12/15  Date of referral:  08/02/18               Reason for consult:  Discharge Planning                Permission sought to share information with:  Facility Art therapist granted to share information::  Yes, Verbal Permission Granted  Name::        Agency::     Relationship::     Contact Information:     Housing/Transportation Living arrangements for the past 2 months:  Apartment Source of Information:  Patient, Facility, Parent Patient Interpreter Needed:  None Criminal Activity/Legal Involvement Pertinent to Current Situation/Hospitalization:  No - Comment as needed Significant Relationships:  Warehouse manager Lives with:  Self Do you feel safe going back to the place where you live?  Yes Need for family participation in patient care:  Yes (Comment)  Care giving concerns:  Patient lives alone in an apartment in [place]. Patient is apart of [facility] supportive services and has caregivers come in a couple of hours during the day.    Social Worker assessment / plan:  Holiday representative (CSW) received consult that patient is from a group home. CSW contacted patient's mother Hannah Clarke who reported that patient does not live in a group home. Per mother patient lives in her own apartment and does not have a guardian. Per mother patient makes her own decisions. Per mother Hannah Clarke caregivers come in during the day for a couple of hours to help patient. Per mother patient also has a boyfriend that she spends time with. CSW met with patient and she confirmed that above information. CSW also contacted Hannah Clarke and spoke to [place] [phone]. Per Anne Ng patient lives in an apartment and makes her own decisions. Plan is for patient to D/C back to her apartment. Please reconsult if future social work needs arise. CSW signing off.   Butch Penny [phone] [place] called CSW and confirmed the above information. Per Butch Penny patient knows to call [facility] staff when she discharges home.   Employment status:  Unemployed Forensic scientist:  Medicare, Medicaid In [place] PT Recommendations:  Not assessed at this time Information / Referral to community resources:  Other (Comment Required)(Patient will D/C back to her apartment. )  Patient/Family's Response to care:  Patient will D/C back to her own apartment.   Patient/Family's Understanding of and Emotional Response to Diagnosis, Current Treatment, and Prognosis:  Patient and her mother were very pleasant and thanked CSW for visit.   Emotional Assessment Appearance:  Appears stated age Attitude/Demeanor/Rapport:    Affect (typically observed):  Pleasant Orientation:  Oriented to Self, Oriented to Place, Oriented to Situation, Oriented to  Time Alcohol / Substance use:  Not Applicable Psych involvement (Current and /or in the community):  No (Comment)  Discharge Needs  Concerns to be addressed:  Discharge Planning Concerns Readmission within the last 30 days:  No Current discharge risk:  Cognitively Impaired Barriers to Discharge:  Continued Medical Work up   [employer], Veronia Beets, LCSW 08/02/2018, 3:40 PM

## 2018-08-02 NOTE — Plan of Care (Signed)
  Problem: Health Behavior/Discharge Planning: Goal: Ability to manage health-related needs will improve Outcome: Progressing   Problem: Clinical Measurements: Goal: Ability to maintain clinical measurements within normal limits will improve Outcome: Progressing Goal: Will remain free from infection Outcome: Progressing Goal: Respiratory complications will improve Outcome: Progressing   Problem: Activity: Goal: Risk for activity intolerance will decrease Outcome: Progressing   Problem: Nutrition: Goal: Adequate nutrition will be maintained Outcome: Progressing   Problem: Coping: Goal: Level of anxiety will decrease Outcome: Progressing   Problem: Elimination: Goal: Will not experience complications related to bowel motility Outcome: Progressing   Problem: Pain Managment: Goal: General experience of comfort will improve Outcome: Progressing   Problem: Safety: Goal: Ability to remain free from injury will improve Outcome: Progressing   

## 2018-08-02 NOTE — Progress Notes (Signed)
[facility] at [place] NAME: Gabriell Daigneault    MR#:  [id number]  DATE OF BIRTH:  28-Mar-1966  SUBJECTIVE:  CHIEF COMPLAINT:   Chief Complaint  Patient presents with  . Emesis   -Patient had upper GI endoscopy done for dysphagia yesterday, postprocedure she started having significant nausea and vomiting. -Still complains of nausea and vomiting.  Also complains of dizziness.  REVIEW OF SYSTEMS:  Review of Systems  Constitutional: Negative for chills, fever and malaise/fatigue.  HENT: Negative for ear discharge, hearing loss and nosebleeds.   Eyes: Negative for blurred vision and double vision.  Respiratory: Negative for cough, shortness of breath and wheezing.   Cardiovascular: Negative for chest pain and palpitations.  Gastrointestinal: Positive for abdominal pain, nausea and vomiting. Negative for constipation and diarrhea.  Genitourinary: Negative for dysuria.  Musculoskeletal: Negative for myalgias.  Neurological: Positive for dizziness. Negative for speech change, focal weakness, seizures, weakness and headaches.  Psychiatric/Behavioral: Negative for depression.    DRUG ALLERGIES:  No Known Allergies  VITALS:  Blood pressure 124/79, pulse 76, temperature 98.6 F (37 C), temperature source Oral, resp. rate 18, height 5\' 2"  (1.575 m), weight 76.7 kg, SpO2 94 %.  PHYSICAL EXAMINATION:  Physical Exam  GENERAL:  52 y.o.-year-old patient lying in the bed, feels miserable EYES: Pupils equal, round, reactive to light and accommodation. No scleral icterus. Extraocular muscles intact.  HEENT: Head atraumatic, normocephalic. Oropharynx and nasopharynx clear.  NECK:  Supple, no jugular venous distention. No thyroid enlargement, no tenderness.  LUNGS: Normal breath sounds bilaterally, no wheezing, rales,rhonchi or crepitation. No use of accessory muscles of respiration.  CARDIOVASCULAR: S1, S2 normal. No murmurs, rubs, or gallops.  ABDOMEN:  Soft, nontender but discomfort in epigastric region on palpation, nondistended. Bowel sounds present. No organomegaly or mass.  EXTREMITIES: No pedal edema, cyanosis, or clubbing.  NEUROLOGIC: Cranial nerves II through XII are intact. Muscle strength 5/5 in all extremities. Sensation intact. Gait not checked.  PSYCHIATRIC: The patient is alert and oriented.  Has mental retardation/developmental delay SKIN: No obvious rash, lesion, or ulcer.    LABORATORY PANEL:   CBC Recent Labs  Lab 08/01/18 1249  WBC 8.4  HGB 13.3  HCT 39.2  PLT 336   ------------------------------------------------------------------------------------------------------------------  Chemistries  Recent Labs  Lab 08/01/18 1249 08/02/18 0419  NA 144 144  K 3.8 2.8*  CL 108 114*  CO2 23 24  GLUCOSE 218* 151*  BUN 9 9  CREATININE 0.54 0.43*  CALCIUM 9.1 8.1*  AST 16  --   ALT 19  --   ALKPHOS 65  --   BILITOT 0.7  --    ------------------------------------------------------------------------------------------------------------------  Cardiac Enzymes No results for input(s): TROPONINI in the last 168 hours. ------------------------------------------------------------------------------------------------------------------  RADIOLOGY:  Dg Abd 2 Views  Result Date: 08/01/2018 CLINICAL DATA:  Patient had upper endoscopy yesterday and last evening experienced vomiting and diarrhea. EXAM: ABDOMEN - 2 VIEW COMPARISON:  CT AP 06/12/2016 FINDINGS: Contrast filled sigmoid diverticula. No bowel obstruction or free air. No organomegaly. Redemonstration of left-sided nephrolithiasis projecting over the lower pole of the left kidney measuring 5 mm. No acute osseous abnormality. IMPRESSION: 1. Nonobstructed, nondistended bowel gas pattern. 2. Left-sided lower pole renal calculus measuring 5 mm is noted. 3. There is colonic diverticulosis. Electronically Signed   By: Ashley Royalty M.D.   On: 08/01/2018 15:06    EKG:    Orders placed or performed during the hospital encounter of 08/01/18  .  ED EKG  . ED EKG    ASSESSMENT AND PLAN:   52 year old female with past medical history significant for anxiety and depression, diabetes, GERD, hypertension and developmental delay's presented to hospital secondary to intractable nausea vomiting  1.  Intractable nausea vomiting-KUB showing non-obstruction bowel gas pattern. -Status post upper GI endoscopy done 08/01/2018 for dysphagia did not show any mechanical causes.  Completely normal stomach duodenum and esophagus -Could be anesthesia related to our gastroparesis -IV Reglan scheduled.  Phenergan as needed, IV fluids and monitor -On clear liquid diet  2.  Hypokalemia-secondary to GI losses.  Being replaced appropriately  3.  Peripheral neuropathy-gabapentin  4.  GERD-Protonix  5.  Depression anxiety-continue home medications  6.  DVT prophylaxis-Lovenox  Encourage PT consult once clinically improving    All the records are reviewed and case discussed with Care Management/Social Workerr. Management plans discussed with the patient, family and they are in agreement.  CODE STATUS: Full code  TOTAL TIME TAKING CARE OF THIS PATIENT: 38 minutes.   POSSIBLE D/C IN 2 DAYS, DEPENDING ON CLINICAL CONDITION.   Kyani Simkin M.D on 08/02/2018 at 12:13 PM  Between 7am to 6pm - Pager - [phone]  After 6pm go to [email or web address] - password [email or web address] [facility]  Office  [phone]  CC: Primary care physician; Danelle Berry, NP

## 2018-08-03 DIAGNOSIS — R112 Nausea with vomiting, unspecified: Secondary | ICD-10-CM | POA: Diagnosis not present

## 2018-08-03 LAB — BASIC METABOLIC PANEL
Anion gap: 6 (ref 5–15)
BUN: 5 mg/dL — AB (ref 6–20)
CALCIUM: 8.2 mg/dL — AB (ref 8.9–10.3)
CO2: 25 mmol/L (ref 22–32)
Chloride: 111 mmol/L (ref 98–111)
Creatinine, Ser: 0.47 mg/dL (ref 0.44–1.00)
GFR calc Af Amer: >60 mL/min (ref >60)
GLUCOSE: 144 mg/dL — AB (ref 70–99)
Potassium: 3.4 mmol/L — ABNORMAL LOW (ref 3.5–5.1)
Sodium: 142 mmol/L (ref 135–145)

## 2018-08-03 LAB — GLUCOSE, CAPILLARY
GLUCOSE-CAPILLARY: 116 mg/dL — AB (ref 70–99)
Glucose-Capillary: 132 mg/dL — ABNORMAL HIGH (ref 70–99)
Glucose-Capillary: 161 mg/dL — ABNORMAL HIGH (ref 70–99)
Glucose-Capillary: 129 mg/dL — ABNORMAL HIGH (ref 70–99)

## 2018-08-03 LAB — PHOSPHORUS: Phosphorus: 3.2 mg/dL (ref 2.5–4.6)

## 2018-08-03 LAB — MAGNESIUM: Magnesium: 2 mg/dL (ref 1.7–2.4)

## 2018-08-03 LAB — URINALYSIS, COMPLETE (UACMP) WITH MICROSCOPIC
Bilirubin Urine: NEGATIVE
GLUCOSE, UA: NEGATIVE mg/dL
KETONES UR: 5 mg/dL — AB
Nitrite: NEGATIVE
Protein, ur: NEGATIVE mg/dL
Specific Gravity, Urine: 1.006 (ref 1.005–1.030)
pH: 7 (ref 5.0–8.0)

## 2018-08-03 MED ORDER — POTASSIUM CHLORIDE 10 MEQ/100ML IV SOLN
10.0000 meq | INTRAVENOUS | Status: AC
  Administered 2018-08-03 (×4): 10 meq via INTRAVENOUS
  Filled 2018-08-03: qty 100

## 2018-08-03 NOTE — Evaluation (Signed)
Physical Therapy Evaluation Patient Details Name: Hannah Clarke MRN: [id number] DOB: 02-18-1966 Today's Date: 08/03/2018   History of Present Illness  Hannah Clarke is a 52yo female PMH: I/DD, GAD, depression, DM, GERD, HTN, HLD, comes to [facility] on 8/29 after sudden onset N/V after upper GI endoscopy procedure on 8/28.   Clinical Impression  Pt admitted with above diagnosis. Pt currently with functional limitations due to the deficits listed below (see "PT Problem List"). Upon entry, pt side lying in bed attempting sleep, no family/caregiver present. The pt is awake and agreeable to participate. The pt is alert and oriented x4, pleasant, conversational, and following simple commands consistently. Obtaining detailed history is difficult d/t intellectual disability, however, pt reports he strabismus as baseline. Pt denies acute hearing changes or tinitus. Finger to nose is WNL. Distal target touching is slightly off bilat, but patient thinks this is because of not having her glasses with her: testing does not appear to be indicative of any dysmetria type presentation. Pt performs bed mobility as cued with additional time needed, but no physical assistance needed. Pt reports improved stability when she is offered a RW to use with STS and STP transfers. The patient reports performing these at a higher level of independence PTA. Nausea is medically well managed in session. Pt will benefit from skilled PT intervention to increase independence and safety with basic mobility in preparation for discharge to the venue listed below.       Follow Up Recommendations Home health PT    Equipment Recommendations  Rolling walker with 5" wheels    Recommendations for Other Services       Precautions / Restrictions Precautions Precautions: None Precaution Comments: pt endorses acute gait instability       Mobility  Bed Mobility Overal bed mobility: Independent                Transfers Overall  transfer level: Needs assistance Equipment used: Rolling walker (2 wheeled) Transfers: Sit to/from Stand;Stand Pivot Transfers Sit to Stand: Supervision Stand pivot transfers: Supervision       General transfer comment: performed 5x from chair at supervision level, no LOB noted, no physical assist needed.    Ambulation/Gait Ambulation/Gait assistance: (limited d/t room constraints as well as enteric precautions; pt AMB with nursing. )              Stairs            Wheelchair Mobility    Modified Rankin (Stroke Patients Only)       Balance Overall balance assessment: Mild deficits observed, not formally tested;Modified Independent                                           Pertinent Vitals/Pain Pain Assessment: No/denies pain    Home Living Family/patient expects to be discharged to:: Group home Living Arrangements: Alone                    Prior Function           Comments: Pt unable to answer in detail d/t I/DD      Hand Dominance        Extremity/Trunk Assessment   Upper Extremity Assessment Upper Extremity Assessment: Overall WFL for tasks assessed    Lower Extremity Assessment Lower Extremity Assessment: Overall WFL for tasks assessed       Communication  Communication: (mild dysarthria, likely baseline. )  Cognition Arousal/Alertness: Awake/alert Behavior During Therapy: WFL for tasks assessed/performed(mildly anxious) Overall Cognitive Status: History of cognitive impairments - at baseline                                        General Comments      Exercises     Assessment/Plan    PT Assessment Patient needs continued PT services  PT Problem List Decreased balance;Decreased activity tolerance       PT Treatment Interventions Balance training;Gait training;Therapeutic activities;DME instruction    PT Goals (Current goals can be found in the Care Plan section)  Acute Rehab PT  Goals Patient Stated Goal: be rid of nausea and dizziness PT Goal Formulation: With patient Time For Goal Achievement: 08/10/18 Potential to Achieve Goals: Good    Frequency Min 2X/week   Barriers to discharge        Co-evaluation               AM-PAC PT "6 Clicks" Daily Activity  Outcome Measure Difficulty turning over in bed (including adjusting bedclothes, sheets and blankets)?: A Little Difficulty moving from lying on back to sitting on the side of the bed? : A Little Difficulty sitting down on and standing up from a chair with arms (e.g., wheelchair, bedside commode, etc,.)?: A Little Help needed moving to and from a bed to chair (including a wheelchair)?: A Little Help needed walking in hospital room?: A Little Help needed climbing 3-5 steps with a railing? : A Lot 6 Click Score: 17    End of Session   Activity Tolerance: Patient tolerated treatment well Patient left: in chair;with call bell/phone within reach   PT Visit Diagnosis: Difficulty in walking, not elsewhere classified (R26.2);Dizziness and giddiness (R42)    Time: 1043-1100 PT Time Calculation (min) (ACUTE ONLY): 17 min   Charges:   PT Evaluation $PT Eval Moderate Complexity: 1 Mod         12:22 PM, 08/03/18 Hannah Clarke, PT, DPT Physical Therapist - [facility]  [phone] ([place])     Hannah Clarke C 08/03/2018, 12:16 PM

## 2018-08-03 NOTE — Care Management Obs Status (Signed)
[place] NOTIFICATION   Patient Details  Name: KIMORI TARTAGLIA MRN: [id number] Date of Birth: 09/17/1966   Medicare Observation Status Notification Given:  Yes    Franchesca Veneziano A Daniah Zaldivar, RN 08/03/2018, 9:07 AM

## 2018-08-03 NOTE — Progress Notes (Signed)
[facility] at [place] NAME: Hannah Clarke    MR#:  [id number]  DATE OF BIRTH:  09-22-1966  SUBJECTIVE:  CHIEF COMPLAINT:   Chief Complaint  Patient presents with  . Emesis   - still complains of nausea/vomiting -Unable to tolerate oral medications.  Improved dizziness and abdominal pain today  REVIEW OF SYSTEMS:  Review of Systems  Constitutional: Negative for chills, fever and malaise/fatigue.  HENT: Negative for ear discharge, hearing loss and nosebleeds.   Eyes: Negative for blurred vision and double vision.  Respiratory: Negative for cough, shortness of breath and wheezing.   Cardiovascular: Negative for chest pain and palpitations.  Gastrointestinal: Positive for abdominal pain, nausea and vomiting. Negative for constipation and diarrhea.  Genitourinary: Negative for dysuria.  Musculoskeletal: Negative for myalgias.  Neurological: Positive for dizziness. Negative for speech change, focal weakness, seizures, weakness and headaches.  Psychiatric/Behavioral: Negative for depression.    DRUG ALLERGIES:  No Known Allergies  VITALS:  Blood pressure 119/78, pulse 66, temperature 98.6 F (37 C), resp. rate 18, height 5\' 2"  (1.575 m), weight 76.7 kg, SpO2 98 %.  PHYSICAL EXAMINATION:  Physical Exam  GENERAL:  52 y.o.-year-old patient lying in the bed, not in any acute distress EYES: Pupils equal, round, reactive to light and accommodation. No scleral icterus. Extraocular muscles intact.  HEENT: Head atraumatic, normocephalic. Oropharynx and nasopharynx clear.  NECK:  Supple, no jugular venous distention. No thyroid enlargement, no tenderness.  LUNGS: Normal breath sounds bilaterally, no wheezing, rales,rhonchi or crepitation. No use of accessory muscles of respiration.  CARDIOVASCULAR: S1, S2 normal. No murmurs, rubs, or gallops.  ABDOMEN: Soft, nontender but discomfort in epigastric region on palpation, nondistended. Bowel sounds  present. No organomegaly or mass.  EXTREMITIES: No pedal edema, cyanosis, or clubbing.  NEUROLOGIC: Cranial nerves II through XII are intact. Muscle strength 5/5 in all extremities. Sensation intact. Gait not checked.  PSYCHIATRIC: The patient is alert and oriented.  Has mental retardation/developmental delay SKIN: No obvious rash, lesion, or ulcer.    LABORATORY PANEL:   CBC Recent Labs  Lab 08/01/18 1249  WBC 8.4  HGB 13.3  HCT 39.2  PLT 336   ------------------------------------------------------------------------------------------------------------------  Chemistries  Recent Labs  Lab 08/01/18 1249  08/03/18 0336  NA 144   < > 142  K 3.8   < > 3.4*  CL 108   < > 111  CO2 23   < > 25  GLUCOSE 218*   < > 144*  BUN 9   < > 5*  CREATININE 0.54   < > 0.47  CALCIUM 9.1   < > 8.2*  MG  --   --  2.0  AST 16  --   --   ALT 19  --   --   ALKPHOS 65  --   --   BILITOT 0.7  --   --    < > = values in this interval not displayed.   ------------------------------------------------------------------------------------------------------------------  Cardiac Enzymes No results for input(s): TROPONINI in the last 168 hours. ------------------------------------------------------------------------------------------------------------------  RADIOLOGY:  Dg Abd 2 Views  Result Date: 08/01/2018 CLINICAL DATA:  Patient had upper endoscopy yesterday and last evening experienced vomiting and diarrhea. EXAM: ABDOMEN - 2 VIEW COMPARISON:  CT AP 06/12/2016 FINDINGS: Contrast filled sigmoid diverticula. No bowel obstruction or free air. No organomegaly. Redemonstration of left-sided nephrolithiasis projecting over the lower pole of the left kidney measuring 5 mm. No acute osseous abnormality. IMPRESSION: 1.  Nonobstructed, nondistended bowel gas pattern. 2. Left-sided lower pole renal calculus measuring 5 mm is noted. 3. There is colonic diverticulosis. Electronically Signed   By: Ashley Royalty  M.D.   On: 08/01/2018 15:06    EKG:   Orders placed or performed during the hospital encounter of 08/01/18  . ED EKG  . ED EKG    ASSESSMENT AND PLAN:   52 year old female with past medical history significant for anxiety and depression, diabetes, GERD, hypertension and developmental delay's presented to hospital secondary to intractable nausea vomiting  1.  Intractable nausea vomiting-KUB showing non-obstruction bowel gas pattern. -Status post upper GI endoscopy done 08/01/2018 for dysphagia did not show any mechanical causes.  Completely normal stomach duodenum and esophagus -Could be anesthesia related or gastroparesis -IV Reglan scheduled.  Phenergan as needed, IV fluids and monitor -On clear liquid diet -KUB also showing a 5 mm left nephrolithiasis.  Continue to monitor.  Check urine analysis  2.  Hypokalemia-secondary to GI losses.  Being replaced appropriately  3.  Peripheral neuropathy-gabapentin  4.  GERD-Protonix  5.  Depression anxiety-continue home medications  6.  DVT prophylaxis-Lovenox  Physical therapy consulted    All the records are reviewed and case discussed with Care Management/Social Workerr. Management plans discussed with the patient, family and they are in agreement.  CODE STATUS: Full code  TOTAL TIME TAKING CARE OF THIS PATIENT:33  minutes.   POSSIBLE D/C IN 2 DAYS, DEPENDING ON CLINICAL CONDITION.   Gladstone Lighter M.D on 08/03/2018 at 9:43 AM  Between 7am to 6pm - Pager - [phone]  After 6pm go to [email or web address] - password [email or web address] [place] [facility]  Office  [phone]  CC: Primary care physician; Danelle Berry, NP

## 2018-08-03 NOTE — Care Management Note (Signed)
Case Management Note  Patient Details  Name: Hannah Clarke MRN: [id number] Date of Birth: 06-13-1966  Subjective/Objective:  Patient admitted to [facility] under observation status for intractable nausea and vomiting. RNCM consulted on patient to provide MOON letter and complete assessment. Patient currently lives in an apartment alone but [facility] supportive services comes and checks on her daily to assist with needs. Patient feels she has a safe arrangement and voices no concerns. Does not have any DME. PCP [facility]. No issues obtaining medications.                   Action/Plan: PT pending.   Expected Discharge Date:  08/02/18               Expected Discharge Plan:     In-House Referral:     Discharge planning Services     Post Acute Care Choice:    Choice offered to:     DME Arranged:    DME Agency:     HH Arranged:    HH Agency:     Status of Service:     If discussed at [employer], dates discussed:    Additional Comments:  Latanya Maudlin, RN 08/03/2018, 2:55 PM

## 2018-08-04 DIAGNOSIS — R112 Nausea with vomiting, unspecified: Secondary | ICD-10-CM | POA: Diagnosis not present

## 2018-08-04 LAB — BASIC METABOLIC PANEL
Anion gap: 9 (ref 5–15)
CO2: 23 mmol/L (ref 22–32)
Calcium: 8.3 mg/dL — ABNORMAL LOW (ref 8.9–10.3)
Chloride: 110 mmol/L (ref 98–111)
Creatinine, Ser: 0.34 mg/dL — ABNORMAL LOW (ref 0.44–1.00)
GFR calc Af Amer: >60 mL/min (ref >60)
Glucose, Bld: 141 mg/dL — ABNORMAL HIGH (ref 70–99)
POTASSIUM: 3.2 mmol/L — AB (ref 3.5–5.1)
SODIUM: 142 mmol/L (ref 135–145)

## 2018-08-04 LAB — GLUCOSE, CAPILLARY
GLUCOSE-CAPILLARY: 131 mg/dL — AB (ref 70–99)
Glucose-Capillary: 107 mg/dL — ABNORMAL HIGH (ref 70–99)

## 2018-08-04 MED ORDER — METOCLOPRAMIDE HCL 10 MG PO TABS: 10.0000 mg | ORAL_TABLET | Freq: Three times a day (TID) | ORAL | 1 refills | Status: DC

## 2018-08-04 MED ORDER — CEPHALEXIN 500 MG PO CAPS: 500.0000 mg | ORAL_CAPSULE | Freq: Three times a day (TID) | ORAL | 0 refills | Status: AC

## 2018-08-04 MED ORDER — POTASSIUM CHLORIDE CRYS ER 20 MEQ PO TBCR
40.0000 meq | EXTENDED_RELEASE_TABLET | Freq: Once | ORAL | Status: AC
  Administered 2018-08-04: 40 meq via ORAL
  Filled 2018-08-04: qty 4

## 2018-08-04 MED ORDER — SODIUM CHLORIDE 0.9 % IV SOLN
1.0000 g | Freq: Every day | INTRAVENOUS | Status: DC
  Administered 2018-08-04: 12:00:00 1 g via INTRAVENOUS
  Filled 2018-08-04: qty 1
  Filled 2018-08-04: qty 10

## 2018-08-04 NOTE — Progress Notes (Signed)
[facility] [place] NAME: Hannah Clarke    MR#:  [id number]  DATE OF BIRTH:  21-Apr-1966  SUBJECTIVE:  CHIEF COMPLAINT:   Chief Complaint  Patient presents with  . Emesis   - doing well, some nausea today - ambulating to the bathroom well  REVIEW OF SYSTEMS:  Review of Systems  Constitutional: Negative for chills, fever and malaise/fatigue.  HENT: Negative for ear discharge, hearing loss and nosebleeds.   Eyes: Negative for blurred vision and double vision.  Respiratory: Negative for cough, shortness of breath and wheezing.   Cardiovascular: Negative for chest pain and palpitations.  Gastrointestinal: Positive for nausea. Negative for abdominal pain, constipation, diarrhea and vomiting.  Genitourinary: Negative for dysuria.  Musculoskeletal: Negative for myalgias.  Neurological: Negative for dizziness, speech change, focal weakness, seizures, weakness and headaches.  Psychiatric/Behavioral: Negative for depression.    DRUG ALLERGIES:  No Known Allergies  VITALS:  Blood pressure 139/83, pulse 67, temperature 98.9 F (37.2 C), temperature source Oral, resp. rate 18, height 5\' 2"  (1.575 m), weight 76.7 kg, SpO2 98 %.  PHYSICAL EXAMINATION:  Physical Exam  GENERAL:  52 y.o.-year-old patient lying in the bed, not in any acute distress EYES: Pupils equal, round, reactive to light and accommodation. No scleral icterus. Extraocular muscles intact.  HEENT: Head atraumatic, normocephalic. Oropharynx and nasopharynx clear.  NECK:  Supple, no jugular venous distention. No thyroid enlargement, no tenderness.  LUNGS: Normal breath sounds bilaterally, no wheezing, rales,rhonchi or crepitation. No use of accessory muscles of respiration.  CARDIOVASCULAR: S1, S2 normal. No murmurs, rubs, or gallops.  ABDOMEN: Soft, nontender, nondistended. Bowel sounds present. No organomegaly or mass.  EXTREMITIES: No pedal edema, cyanosis, or clubbing.    NEUROLOGIC: Cranial nerves II through XII are intact. Muscle strength 5/5 in all extremities. Sensation intact. Gait not checked.  PSYCHIATRIC: The patient is alert and oriented.  Has mental retardation/developmental delay SKIN: No obvious rash, lesion, or ulcer.    LABORATORY PANEL:   CBC Recent Labs  Lab 08/01/18 1249  WBC 8.4  HGB 13.3  HCT 39.2  PLT 336   ------------------------------------------------------------------------------------------------------------------  Chemistries  Recent Labs  Lab 08/01/18 1249  08/03/18 0336 08/04/18 0431  NA 144   < > 142 142  K 3.8   < > 3.4* 3.2*  CL 108   < > 111 110  CO2 23   < > 25 23  GLUCOSE 218*   < > 144* 141*  BUN 9   < > 5* <5*  CREATININE 0.54   < > 0.47 0.34*  CALCIUM 9.1   < > 8.2* 8.3*  MG  --   --  2.0  --   AST 16  --   --   --   ALT 19  --   --   --   ALKPHOS 65  --   --   --   BILITOT 0.7  --   --   --    < > = values in this interval not displayed.   ------------------------------------------------------------------------------------------------------------------  Cardiac Enzymes No results for input(s): TROPONINI in the last 168 hours. ------------------------------------------------------------------------------------------------------------------  RADIOLOGY:  No results found.  EKG:   Orders placed or performed during the hospital encounter of 08/01/18  . ED EKG  . ED EKG    ASSESSMENT AND PLAN:   52 year old female with past medical history significant for anxiety and depression, diabetes, GERD, hypertension and developmental delay's presented to hospital secondary  to intractable nausea vomiting  1.  Intractable nausea vomiting-KUB showing non-obstruction bowel gas pattern. -Status post upper GI endoscopy done 08/01/2018 for dysphagia did not show any mechanical causes.  Completely normal stomach duodenum and esophagus -Could be anesthesia related or gastroparesis -Much improved with Reglan  scheduled.  We will discharge on oral Reglan. -Diet advanced to soft diet today. -KUB also showing a 5 mm left nephrolithiasis.   2.  Hypokalemia-secondary to GI losses.  Being replaced appropriately  3.  Peripheral neuropathy-gabapentin  4.  Acute cystitis-cultures have been ordered.  Started on Rocephin.  Will discharge on Keflex  5.  Depression anxiety-continue home medications  6.  DVT prophylaxis-Lovenox  Physical therapy consulted-recommended home health. If able to tolerate diet, possible discharge today    All the records are reviewed and case discussed with Care Management/Social Workerr. Management plans discussed with the patient, family and they are in agreement.  CODE STATUS: Full code  TOTAL TIME TAKING CARE OF THIS PATIENT: 37  minutes.   POSSIBLE D/C IN 2 DAYS, DEPENDING ON CLINICAL CONDITION.   Gladstone Lighter M.D on 08/04/2018 at 12:32 PM  Between 7am to 6pm - Pager - [phone]  After 6pm go to [email or web address] - password [email or web address] [facility]  Office  [phone]  CC: Primary care physician; Danelle Berry, NP

## 2018-08-04 NOTE — Care Management Note (Signed)
Case Management Note  Patient Details  Name: Hannah Clarke MRN: [id number] Date of Birth: 1966/10/22  Subjective/Objective: Patient to be discharged per MD order. Orders in place for home health services. Patient agreeable to RN and PT in the home. Patient had no preference of agency. Referral placed with [facility]. Jermaine accepted the patient for services as well as providing DME walker. No further needs.  Ines Bloomer RN BSN RNCM [phone]                     Action/Plan:   Expected Discharge Date:  08/04/18               Expected Discharge Plan:  [place]  In-House Referral:     Discharge planning Services  CM Consult  Post Acute Care Choice:  Durable Medical Equipment, Home Health Choice offered to:  Patient  DME Arranged:  Walker rolling DME Agency:  [place] Arranged:  RN, PT [facility] Agency:  [facility]  Status of Service:  Completed, signed off  If discussed at [place] of Stay Meetings, dates discussed:    Additional Comments:  Latanya Maudlin, RN 08/04/2018, 1:23 PM

## 2018-08-05 NOTE — Discharge Summary (Signed)
[facility] [place] NAME: Hannah Clarke    MR#:  [id number]  DATE OF BIRTH:  June 13, 1966  DATE OF ADMISSION:  08/01/2018   ADMITTING PHYSICIAN: Henreitta Leber, MD  DATE OF DISCHARGE: 08/04/2018  5:38 PM  PRIMARY CARE PHYSICIAN: Danelle Berry, NP   ADMISSION DIAGNOSIS:   Emesis [R11.10] Nausea vomiting and diarrhea [R11.2, R19.7]  DISCHARGE DIAGNOSIS:   Active Problems:   Intractable nausea and vomiting   SECONDARY DIAGNOSIS:   Past Medical History:  Diagnosis Date  . Anxiety   . Chronic headaches   . Depression   . Dermatophytosis of nail   . Diabetes mellitus without complication ([place])   . GERD (gastroesophageal reflux disease)   . High cholesterol   . Hypertension   . Mental developmental delay   . Onychia of toe   . Panic attacks     HOSPITAL COURSE:   52 year old female with past medical history significant for anxiety and depression, diabetes, GERD, hypertension and developmental delay's presented to hospital secondary to intractable nausea vomiting  1.  Intractable nausea vomiting-KUB showing non-obstruction bowel gas pattern. -Status post upper GI endoscopy done 08/01/2018 for dysphagia did not show any mechanical causes.  Completely normal stomach duodenum and esophagus -Could be anesthesia related or gastroparesis -Much improved with Reglan scheduled.  discharge on oral Reglan. -tolerating soft diet -KUB also showing a 5 mm left nephrolithiasis.   2.  Hypokalemia-secondary to GI losses.   replaced appropriately  3.  Peripheral neuropathy-gabapentin  4.  Acute cystitis-cultures growing gram negative rods.  on Rocephin.  change to oral Keflex at discharge  5.  Depression anxiety-continue home medications   Physical therapy consulted-recommended home health. Discharge today   DISCHARGE CONDITIONS:   Guarded  CONSULTS OBTAINED:   None  DRUG ALLERGIES:   No Known Allergies DISCHARGE  MEDICATIONS:   Allergies as of 08/04/2018   No Known Allergies     Medication List    STOP taking these medications   amoxicillin 875 MG tablet Commonly known as:  AMOXIL   brompheniramine-pseudoephedrine-DM 30-2-10 MG/5ML syrup   ibuprofen 600 MG tablet Commonly known as:  ADVIL,MOTRIN   losartan 100 MG tablet Commonly known as:  COZAAR   meloxicam 15 MG tablet Commonly known as:  MOBIC   propranolol 40 MG tablet Commonly known as:  INDERAL     TAKE these medications   acetaminophen 325 MG tablet Commonly known as:  TYLENOL Take 325 mg by mouth every 6 (six) hours as needed.   albuterol 108 (90 Base) MCG/ACT inhaler Commonly known as:  PROVENTIL HFA;VENTOLIN HFA Inhale 2 puffs into the lungs every 6 (six) hours as needed for wheezing or shortness of breath.   amLODipine 5 MG tablet Commonly known as:  NORVASC Take 5 mg by mouth daily.   cephALEXin 500 MG capsule Commonly known as:  KEFLEX Take 1 capsule (500 mg total) by mouth 3 (three) times daily for 7 days.   cetirizine 10 MG tablet Commonly known as:  ZYRTEC Take 10 mg by mouth daily.   cholecalciferol 1000 units tablet Commonly known as:  VITAMIN D Take 1,000 Units by mouth daily.   citalopram 20 MG tablet Commonly known as:  CELEXA Take 20 mg by mouth daily.   clopidogrel 75 MG tablet Commonly known as:  PLAVIX Take 75 mg by mouth daily.   DEXILANT 60 MG capsule Generic drug:  dexlansoprazole Take 60 mg by mouth daily.  LORazepam 1 MG tablet Commonly known as:  ATIVAN Take 1 tablet (1 mg total) by mouth every 8 (eight) hours as needed for anxiety.   medroxyPROGESTERone 150 MG/ML injection Commonly known as:  DEPO-PROVERA Inject 150 mg into the muscle every 3 (three) months.   metFORMIN 500 MG tablet Commonly known as:  GLUCOPHAGE Take 500 mg by mouth 2 (two) times daily with a meal.   metoCLOPramide 10 MG tablet Commonly known as:  REGLAN Take 1 tablet (10 mg total) by mouth 3  (three) times daily before meals.   ondansetron 4 MG disintegrating tablet Commonly known as:  ZOFRAN-ODT Take 1 tablet (4 mg total) by mouth every 8 (eight) hours as needed for nausea or vomiting.   ondansetron 4 MG tablet Commonly known as:  ZOFRAN Take 1 tablet (4 mg total) by mouth daily as needed for nausea or vomiting.   polyethylene glycol packet Commonly known as:  MIRALAX / GLYCOLAX Take 17 g by mouth every other day.   potassium chloride SA 20 MEQ tablet Commonly known as:  K-DUR,KLOR-CON Take 20 mEq by mouth daily.   sennosides-docusate sodium 8.6-50 MG tablet Commonly known as:  SENOKOT-S Take 1 tablet by mouth daily.   simvastatin 20 MG tablet Commonly known as:  ZOCOR Take 20 mg by mouth daily.   sucralfate 1 g tablet Commonly known as:  CARAFATE Take 1 g by mouth 4 (four) times daily -  with meals and at bedtime.        DISCHARGE INSTRUCTIONS:   1. PCP f/u in 1 week  DIET:   Regular diet  ACTIVITY:   Activity as tolerated  OXYGEN:   Home Oxygen: No.  Oxygen Delivery: room air  DISCHARGE LOCATION:   home   If you experience worsening of your admission symptoms, develop shortness of breath, life threatening emergency, suicidal or homicidal thoughts you must seek medical attention immediately by calling 911 or calling your MD immediately  if symptoms less severe.  You Must read complete instructions/literature along with all the possible adverse reactions/side effects for all the Medicines you take and that have been prescribed to you. Take any new Medicines after you have completely understood and accpet all the possible adverse reactions/side effects.   Please note  You were cared for by a hospitalist during your hospital stay. If you have any questions about your discharge medications or the care you received while you were in the hospital after you are discharged, you can call the unit and asked to speak with the hospitalist on call if the  hospitalist that took care of you is not available. Once you are discharged, your primary care physician will handle any further medical issues. Please note that NO REFILLS for any discharge medications will be authorized once you are discharged, as it is imperative that you return to your primary care physician (or establish a relationship with a primary care physician if you do not have one) for your aftercare needs so that they can reassess your need for medications and monitor your lab values.    On the day of Discharge:  VITAL SIGNS:   Blood pressure 139/83, pulse 67, temperature 98.9 F (37.2 C), temperature source Oral, resp. rate 18, height 5\' 2"  (1.575 m), weight 76.7 kg, SpO2 98 %.  PHYSICAL EXAMINATION:   GENERAL:  52 y.o.-year-old patient lying in the bed, not in any acute distress EYES: Pupils equal, round, reactive to light and accommodation. No scleral icterus. Extraocular muscles intact.  HEENT: Head atraumatic, normocephalic. Oropharynx and nasopharynx clear.  NECK:  Supple, no jugular venous distention. No thyroid enlargement, no tenderness.  LUNGS: Normal breath sounds bilaterally, no wheezing, rales,rhonchi or crepitation. No use of accessory muscles of respiration.  CARDIOVASCULAR: S1, S2 normal. No murmurs, rubs, or gallops.  ABDOMEN: Soft, nontender, nondistended. Bowel sounds present. No organomegaly or mass.  EXTREMITIES: No pedal edema, cyanosis, or clubbing.  NEUROLOGIC: Cranial nerves II through XII are intact. Muscle strength 5/5 in all extremities. Sensation intact. Gait not checked.  PSYCHIATRIC: The patient is alert and oriented.  Has mental retardation/developmental delay SKIN: No obvious rash, lesion, or ulcer.   DATA REVIEW:   CBC Recent Labs  Lab 08/01/18 1249  WBC 8.4  HGB 13.3  HCT 39.2  PLT 336    Chemistries  Recent Labs  Lab 08/01/18 1249  08/03/18 0336 08/04/18 0431  NA 144   < > 142 142  K 3.8   < > 3.4* 3.2*  CL 108   < > 111 110   CO2 23   < > 25 23  GLUCOSE 218*   < > 144* 141*  BUN 9   < > 5* <5*  CREATININE 0.54   < > 0.47 0.34*  CALCIUM 9.1   < > 8.2* 8.3*  MG  --   --  2.0  --   AST 16  --   --   --   ALT 19  --   --   --   ALKPHOS 65  --   --   --   BILITOT 0.7  --   --   --    < > = values in this interval not displayed.     Microbiology Results  Results for orders placed or performed during the hospital encounter of 08/01/18  MRSA PCR Screening     Status: None   Collection Time: 08/01/18  9:14 PM  Result Value Ref Range Status   MRSA by PCR NEGATIVE NEGATIVE Final    Comment:        The GeneXpert MRSA Assay (FDA approved for NASAL specimens only), is one component of a comprehensive MRSA colonization surveillance program. It is not intended to diagnose MRSA infection nor to guide or monitor treatment for MRSA infections. Performed at [facility], [address], [place], [place] [address]   Urine Culture     Status: Abnormal (Preliminary result)   Collection Time: 08/03/18 10:04 AM  Result Value Ref Range Status   Specimen Description   Final    URINE, RANDOM Performed at [facility], [address], [place], [place] [address]    Special Requests   Final    NONE Performed at [facility], [address] [place], [place] [address]    Culture >=100,000 COLONIES/mL ESCHERICHIA COLI (A)  Final   Report Status PENDING  Incomplete    RADIOLOGY:  No results found.   Management plans discussed with the patient, family and they are in agreement.  CODE STATUS:  Code Status History    Date Active Date Inactive Code Status Order ID Comments User Context   08/01/2018 1955 08/04/2018 2125 Full Code [id number]  Henreitta Leber, MD Inpatient   08/17/2017 2309 08/18/2017 1907 Full Code [id number]  Hugelmeyer, Ubaldo Glassing, DO Inpatient      TOTAL TIME TAKING CARE OF THIS PATIENT: 38 minutes.    Gladstone Lighter M.D on 08/05/2018 at 1:24 PM  Between 7am to 6pm -  Pager -  [phone]  After 6pm go to [email or web address] - [employer] [facility]  Office  [phone]  CC: Primary care physician; Danelle Berry, NP   Note: This dictation was prepared with Dragon dictation along with smaller phrase technology. Any transcriptional errors that result from this process are unintentional.

## 2018-08-06 LAB — URINE CULTURE

## 2018-08-27 ENCOUNTER — Ambulatory Visit: Payer: Self-pay | Admitting: Neurology

## 2018-08-28 ENCOUNTER — Ambulatory Visit: Payer: Self-pay | Admitting: Neurology

## 2018-09-02 ENCOUNTER — Other Ambulatory Visit: Payer: Self-pay | Admitting: Nurse Practitioner

## 2018-09-02 DIAGNOSIS — N912 Amenorrhea, unspecified: Secondary | ICD-10-CM

## 2018-09-06 ENCOUNTER — Ambulatory Visit
Admission: RE | Admit: 2018-09-06 | Discharge: 2018-09-06 | Disposition: A | Discharge status: Home / Self Care | Payer: Medicare Other | Source: Ambulatory Visit | Attending: Nurse Practitioner | Admitting: Nurse Practitioner

## 2018-09-06 DIAGNOSIS — N912 Amenorrhea, unspecified: Secondary | ICD-10-CM | POA: Insufficient documentation

## 2018-09-06 DIAGNOSIS — R1900 Intra-abdominal and pelvic swelling, mass and lump, unspecified site: Secondary | ICD-10-CM | POA: Diagnosis not present

## 2018-09-24 ENCOUNTER — Encounter: Payer: Self-pay | Admitting: Obstetrics and Gynecology

## 2018-09-26 ENCOUNTER — Encounter: Payer: Self-pay | Admitting: Obstetrics and Gynecology

## 2018-10-10 ENCOUNTER — Encounter: Payer: Self-pay | Admitting: Obstetrics and Gynecology

## 2018-10-10 ENCOUNTER — Ambulatory Visit (INDEPENDENT_AMBULATORY_CARE_PROVIDER_SITE_OTHER): Payer: Medicare Other | Admitting: Obstetrics and Gynecology

## 2018-10-10 VITALS — BP 122/85 | HR 81 | Ht 62.0 in | Wt 171.0 lb

## 2018-10-10 DIAGNOSIS — D259 Leiomyoma of uterus, unspecified: Secondary | ICD-10-CM

## 2018-10-10 NOTE — Progress Notes (Signed)
HPI:      Hannah Clarke is a 52 y.o. No obstetric history on file. who LMP was No LMP recorded (lmp unknown). Patient has had an injection.  Subjective:   She presents today as a referral for a finding on ultrasound.  The patient and her current caregiver who has accompanied her are very poor historians.  From what I can gather she had an ultrasound and a secondary finding was a 1 cm mass near the cervix with a vascular component. The patient states she is not having any bleeding and is on Depo-Provera.  She seems to be having no symptoms from this "mass".    Hx: The following portions of the patient's history were reviewed and updated as appropriate:             She  has a past medical history of Anxiety, Chronic headaches, Depression, Dermatophytosis of nail, Diabetes mellitus without complication ([place]), GERD (gastroesophageal reflux disease), High cholesterol, Hypertension, Mental developmental delay, Onychia of toe, and Panic attacks. She does not have any pertinent problems on file. She  has a past surgical history that includes Hand surgery; Kidney stone surgery; Colonoscopy with propofol (N/A, 10/09/2017); Esophagogastroduodenoscopy (egd) with propofol (N/A, 10/09/2017); Colonoscopy with propofol (N/A, 02/06/2018); and Esophagogastroduodenoscopy (egd) with propofol (N/A, 07/31/2018). Her family history includes Breast cancer (age of onset: 42) in her mother. She  reports that she has never smoked. She has never used smokeless tobacco. She reports that she drinks alcohol. She reports that she does not use drugs. She has a current medication list which includes the following prescription(s): acetaminophen, albuterol, amlodipine, cetirizine, cholecalciferol, citalopram, clopidogrel, dexlansoprazole, lorazepam, medroxyprogesterone, metformin, ondansetron, polyethylene glycol, potassium chloride sa, sennosides-docusate sodium, simvastatin, sucralfate, and metoclopramide. She has No Known  Allergies.       Review of Systems:  Review of Systems  Constitutional: Denied constitutional symptoms, night sweats, recent illness, fatigue, fever, insomnia and weight loss.  Eyes: Denied eye symptoms, eye pain, photophobia, vision change and visual disturbance.  Ears/Nose/Throat/Neck: Denied ear, nose, throat or neck symptoms, hearing loss, nasal discharge, sinus congestion and sore throat.  Cardiovascular: Denied cardiovascular symptoms, arrhythmia, chest pain/pressure, edema, exercise intolerance, orthopnea and palpitations.  Respiratory: Denied pulmonary symptoms, asthma, pleuritic pain, productive sputum, cough, dyspnea and wheezing.  Gastrointestinal: Denied, gastro-esophageal reflux, melena, nausea and vomiting.  Genitourinary: Denied genitourinary symptoms including symptomatic vaginal discharge, pelvic relaxation issues, and urinary complaints.  Musculoskeletal: Denied musculoskeletal symptoms, stiffness, swelling, muscle weakness and myalgia.  Dermatologic: Denied dermatology symptoms, rash and scar.  Neurologic: Denied neurology symptoms, dizziness, headache, neck pain and syncope.  Psychiatric: Denied psychiatric symptoms, anxiety and depression.  Endocrine: Denied endocrine symptoms including hot flashes and night sweats.   Meds:   Current Outpatient Medications on File Prior to Visit  Medication Sig Dispense Refill  . acetaminophen (TYLENOL) 325 MG tablet Take 325 mg by mouth every 6 (six) hours as needed.    Marland Kitchen albuterol (PROVENTIL HFA;VENTOLIN HFA) 108 (90 BASE) MCG/ACT inhaler Inhale 2 puffs into the lungs every 6 (six) hours as needed for wheezing or shortness of breath. 1 Inhaler 2  . amLODipine (NORVASC) 5 MG tablet Take 5 mg by mouth daily.    . cetirizine (ZYRTEC) 10 MG tablet Take 10 mg by mouth daily.    . cholecalciferol (VITAMIN D) 1000 units tablet Take 1,000 Units by mouth daily.    . citalopram (CELEXA) 20 MG tablet Take 20 mg by mouth daily.    . clopidogrel  (PLAVIX)  75 MG tablet Take 75 mg by mouth daily.    Marland Kitchen dexlansoprazole (DEXILANT) 60 MG capsule Take 60 mg by mouth daily.    Marland Kitchen LORazepam (ATIVAN) 1 MG tablet Take 1 tablet (1 mg total) by mouth every 8 (eight) hours as needed for anxiety. 15 tablet 0  . medroxyPROGESTERone (DEPO-PROVERA) 150 MG/ML injection Inject 150 mg into the muscle every 3 (three) months.    . metFORMIN (GLUCOPHAGE) 500 MG tablet Take 500 mg by mouth 2 (two) times daily with a meal.    . ondansetron (ZOFRAN ODT) 4 MG disintegrating tablet Take 1 tablet (4 mg total) by mouth every 8 (eight) hours as needed for nausea or vomiting. 20 tablet 0  . polyethylene glycol (MIRALAX / GLYCOLAX) packet Take 17 g by mouth every other day.    . potassium chloride SA (K-DUR,KLOR-CON) 20 MEQ tablet Take 20 mEq by mouth daily.    . sennosides-docusate sodium (SENOKOT-S) 8.6-50 MG tablet Take 1 tablet by mouth daily.    . simvastatin (ZOCOR) 20 MG tablet Take 20 mg by mouth daily.    . sucralfate (CARAFATE) 1 g tablet Take 1 g by mouth 4 (four) times daily -  with meals and at bedtime.    . metoCLOPramide (REGLAN) 10 MG tablet Take 1 tablet (10 mg total) by mouth 3 (three) times daily before meals. 90 tablet 1   No current facility-administered medications on file prior to visit.     Objective:     Vitals:   10/10/18 0942  BP: 122/85  Pulse: 81              Ultrasound report noted and reviewed.  Assessment:    No obstetric history on file. Patient Active Problem List   Diagnosis Date Noted  . Intractable nausea and vomiting 08/01/2018  . Unstable angina ([place]) 08/17/2017     1. Fibroid of cervix     Patient seems to be asymptomatic with no bleeding.   Plan:            1.  As it has been approximately 5 weeks since her previous ultrasound I recommended a follow-up ultrasound to try to further characterize this mass.  It is possibly a fibroid or a polyp.  Once the masses further characterize we can decide if any  management is necessary. Orders No orders of the defined types were placed in this encounter.   No orders of the defined types were placed in this encounter.     F/U  Return in about 2 weeks (around 10/24/2018). I spent 22 minutes involved in the care of this patient of which greater than 50% was spent discussing her history, presence of a mass on ultrasound, associated possibilities differential diagnosis and follow-ups.  Finis Bud, M.D. 10/10/2018 11:33 AM

## 2018-10-10 NOTE — Progress Notes (Signed)
Pt presents today as a referral for cervical polyp.

## 2018-10-24 ENCOUNTER — Other Ambulatory Visit: Payer: Self-pay | Admitting: Obstetrics and Gynecology

## 2018-10-24 ENCOUNTER — Ambulatory Visit (INDEPENDENT_AMBULATORY_CARE_PROVIDER_SITE_OTHER): Payer: Medicare Other

## 2018-10-24 DIAGNOSIS — D251 Intramural leiomyoma of uterus: Secondary | ICD-10-CM

## 2018-10-24 DIAGNOSIS — D252 Subserosal leiomyoma of uterus: Secondary | ICD-10-CM | POA: Diagnosis not present

## 2018-10-24 DIAGNOSIS — N841 Polyp of cervix uteri: Secondary | ICD-10-CM | POA: Diagnosis not present

## 2019-02-01 ENCOUNTER — Emergency Department
Admission: EM | Admit: 2019-02-01 | Discharge: 2019-02-01 | Disposition: A | Discharge status: Home / Self Care | Payer: Medicare Other | Attending: Emergency Medicine | Admitting: Emergency Medicine

## 2019-02-01 ENCOUNTER — Other Ambulatory Visit: Payer: Self-pay

## 2019-02-01 DIAGNOSIS — I10 Essential (primary) hypertension: Secondary | ICD-10-CM | POA: Diagnosis not present

## 2019-02-01 DIAGNOSIS — R519 Headache, unspecified: Secondary | ICD-10-CM

## 2019-02-01 DIAGNOSIS — R51 Headache: Secondary | ICD-10-CM | POA: Diagnosis present

## 2019-02-01 DIAGNOSIS — E119 Type 2 diabetes mellitus without complications: Secondary | ICD-10-CM | POA: Insufficient documentation

## 2019-02-01 DIAGNOSIS — F419 Anxiety disorder, unspecified: Secondary | ICD-10-CM | POA: Diagnosis not present

## 2019-02-01 MED ORDER — ACETAMINOPHEN 500 MG PO TABS
1000.0000 mg | ORAL_TABLET | Freq: Once | ORAL | Status: AC
  Administered 2019-02-01: 1000 mg via ORAL
  Filled 2019-02-01: qty 2

## 2019-02-01 MED ORDER — DIAZEPAM 5 MG PO TABS
5.0000 mg | ORAL_TABLET | Freq: Once | ORAL | Status: AC
  Administered 2019-02-01: 5 mg via ORAL
  Filled 2019-02-01: qty 1

## 2019-02-01 NOTE — ED Provider Notes (Signed)
[facility] Emergency Department Provider Note       Time seen: ----------------------------------------- 8:34 PM on 02/01/2019 -----------------------------------------   I have reviewed the triage vital signs and the nursing notes.  HISTORY   Chief Complaint Headache    HPI Hannah Clarke is a 53 y.o. female with a history of anxiety, chronic headaches, depression, diabetes, GERD, hyperlipidemia who presents to the ED for headache that is pounding.  Patient reports she lives by her self, states several family members have recently passed which is made her depressed and anxious.  She is very tearful on arrival here.  Past Medical History:  Diagnosis Date  . Anxiety   . Chronic headaches   . Depression   . Dermatophytosis of nail   . Diabetes mellitus without complication ([place])   . GERD (gastroesophageal reflux disease)   . High cholesterol   . Hypertension   . Mental developmental delay   . Onychia of toe   . Panic attacks     Patient Active Problem List   Diagnosis Date Noted  . Intractable nausea and vomiting 08/01/2018  . Unstable angina ([place]) 08/17/2017    Past Surgical History:  Procedure Laterality Date  . COLONOSCOPY WITH PROPOFOL N/A 10/09/2017   Procedure: COLONOSCOPY WITH PROPOFOL;  Surgeon: Toledo, Benay Pike, MD;  Location: [facility] ENDOSCOPY;  Service: Gastroenterology;  Laterality: N/A;  . COLONOSCOPY WITH PROPOFOL N/A 02/06/2018   Procedure: COLONOSCOPY WITH PROPOFOL;  Surgeon: Toledo, Benay Pike, MD;  Location: [facility] ENDOSCOPY;  Service: Gastroenterology;  Laterality: N/A;  . ESOPHAGOGASTRODUODENOSCOPY (EGD) WITH PROPOFOL N/A 10/09/2017   Procedure: ESOPHAGOGASTRODUODENOSCOPY (EGD) WITH PROPOFOL;  Surgeon: Toledo, Benay Pike, MD;  Location: [facility] ENDOSCOPY;  Service: Gastroenterology;  Laterality: N/A;  . ESOPHAGOGASTRODUODENOSCOPY (EGD) WITH PROPOFOL N/A 07/31/2018   Procedure: ESOPHAGOGASTRODUODENOSCOPY (EGD) WITH PROPOFOL;  Surgeon:  Toledo, Benay Pike, MD;  Location: [facility] ENDOSCOPY;  Service: Gastroenterology;  Laterality: N/A;  . HAND SURGERY    . KIDNEY STONE SURGERY      Allergies Patient has no known allergies.  Social History Social History   Tobacco Use  . Smoking status: Never Smoker  . Smokeless tobacco: Never Used  Substance Use Topics  . Alcohol use: Yes    Comment: OCCASSIONALLY  . Drug use: No    Review of Systems Constitutional: Negative for fever. Cardiovascular: Negative for chest pain. Respiratory: Negative for shortness of breath. Gastrointestinal: Negative for abdominal pain, vomiting and diarrhea. Musculoskeletal: Negative for back pain. Skin: Negative for rash. Neurological: Positive for headache Psychiatric: Positive for anxiety  All systems negative/normal/unremarkable except as stated in the HPI  ____________________________________________   PHYSICAL EXAM:  VITAL SIGNS: ED Triage Vitals [02/01/19 1930]  Enc Vitals Group     BP (!) 165/93     Pulse Rate 86     Resp (!) 22     Temp 98.4 F (36.9 C)     Temp Source Oral     SpO2 96 %     Weight 168 lb (76.2 kg)     Height 5\' 2"  (1.575 m)     Head Circumference      Peak Flow      Pain Score      Pain Loc      Pain Edu?      Excl. in [place]?    Constitutional: Well appearing and in no distress. Eyes: Conjunctivae are normal. Normal extraocular movements. Cardiovascular: Normal rate, regular rhythm. No murmurs, rubs, or gallops. Respiratory: Normal respiratory effort without  tachypnea nor retractions. Breath sounds are clear and equal bilaterally. No wheezes/rales/rhonchi. Gastrointestinal: Soft and nontender. Normal bowel sounds Musculoskeletal: Nontender with normal range of motion in extremities. No lower extremity tenderness nor edema. Neurologic: No gross focal neurologic deficits are appreciated.  Skin:  Skin is warm, dry and intact. No rash noted. Psychiatric:  Anxious ____________________________________________  ED COURSE:  As part of my medical decision making, I reviewed the following data within the [place] History obtained from family if available, nursing notes, old chart and ekg, as well as notes from prior ED visits. Patient presented for headache and anxiety, we will assess with labs as indicated at this time   Procedures ____________________________________________   DIFFERENTIAL DIAGNOSIS   Depression, anxiety, chronic headaches  FINAL ASSESSMENT AND PLAN  Anxiety, chronic headache   Plan: The patient had presented for chronic headache and anxiety.  Patient was treated with oral Valium and Tylenol here.  Symptoms appear improved.  She is cleared for outpatient follow-up.   Laurence Aly, MD    Note: This note was generated in part or whole with voice recognition software. Voice recognition is usually quite accurate but there are transcription errors that can and very often do occur. I apologize for any typographical errors that were not detected and corrected.     Earleen Newport, MD 02/01/19 2051

## 2019-02-01 NOTE — ED Notes (Signed)
No peripheral IV placed this visit.   Discharge instructions reviewed with patient. Questions fielded by this RN. Patient verbalizes understanding of instructions. Patient discharged home in stable condition per williams. No acute distress noted at time of discharge.   

## 2019-02-01 NOTE — ED Triage Notes (Addendum)
Patient reporting a headache that is pounding.  Patient reports she lives by herself, states several family members have passed recently that has made her depressed and anxious.  Patient very tearful in triage.

## 2019-02-13 ENCOUNTER — Other Ambulatory Visit: Payer: Self-pay | Admitting: Nurse Practitioner

## 2019-02-13 DIAGNOSIS — Z1231 Encounter for screening mammogram for malignant neoplasm of breast: Secondary | ICD-10-CM

## 2019-02-19 ENCOUNTER — Other Ambulatory Visit: Payer: Self-pay | Admitting: Gastroenterology

## 2019-02-19 DIAGNOSIS — R112 Nausea with vomiting, unspecified: Secondary | ICD-10-CM

## 2019-02-19 DIAGNOSIS — R1084 Generalized abdominal pain: Secondary | ICD-10-CM

## 2019-02-19 DIAGNOSIS — R140 Abdominal distension (gaseous): Secondary | ICD-10-CM

## 2019-02-28 ENCOUNTER — Other Ambulatory Visit
Admission: RE | Admit: 2019-02-28 | Discharge: 2019-02-28 | Disposition: A | Discharge status: Home / Self Care | Payer: Medicare Other | Source: Ambulatory Visit | Attending: Gastroenterology | Admitting: Gastroenterology

## 2019-02-28 ENCOUNTER — Ambulatory Visit: Payer: Medicare Other

## 2019-02-28 DIAGNOSIS — R197 Diarrhea, unspecified: Secondary | ICD-10-CM | POA: Diagnosis present

## 2019-02-28 LAB — GASTROINTESTINAL PANEL BY PCR, STOOL (REPLACES STOOL CULTURE)
ASTROVIRUS: NOT DETECTED
Adenovirus F40/41: NOT DETECTED
CRYPTOSPORIDIUM: NOT DETECTED
CYCLOSPORA CAYETANENSIS: NOT DETECTED
Campylobacter species: NOT DETECTED
ENTAMOEBA HISTOLYTICA: NOT DETECTED
ENTEROTOXIGENIC E COLI (ETEC): NOT DETECTED
Enteroaggregative E coli (EAEC): NOT DETECTED
Enteropathogenic E coli (EPEC): NOT DETECTED
Giardia lamblia: NOT DETECTED
Norovirus GI/GII: NOT DETECTED
Plesimonas shigelloides: NOT DETECTED
Rotavirus A: NOT DETECTED
SAPOVIRUS (I, II, IV, AND V): NOT DETECTED
Salmonella species: NOT DETECTED
Shiga like toxin producing E coli (STEC): NOT DETECTED
Shigella/Enteroinvasive E coli (EIEC): NOT DETECTED
VIBRIO CHOLERAE: NOT DETECTED
VIBRIO SPECIES: NOT DETECTED
YERSINIA ENTEROCOLITICA: NOT DETECTED

## 2019-02-28 LAB — C DIFFICILE QUICK SCREEN W PCR REFLEX
C Diff antigen: NEGATIVE
C Diff interpretation: NOT DETECTED
C Diff toxin: NEGATIVE

## 2019-03-26 ENCOUNTER — Ambulatory Visit: Admission: RE | Admit: 2019-03-26 | Payer: Medicare HMO | Source: Ambulatory Visit

## 2019-04-11 DIAGNOSIS — I10 Essential (primary) hypertension: Secondary | ICD-10-CM | POA: Diagnosis not present

## 2019-04-11 DIAGNOSIS — D519 Vitamin B12 deficiency anemia, unspecified: Secondary | ICD-10-CM | POA: Diagnosis not present

## 2019-04-11 DIAGNOSIS — E785 Hyperlipidemia, unspecified: Secondary | ICD-10-CM | POA: Diagnosis not present

## 2019-04-11 DIAGNOSIS — E119 Type 2 diabetes mellitus without complications: Secondary | ICD-10-CM | POA: Diagnosis not present

## 2019-04-15 DIAGNOSIS — K5900 Constipation, unspecified: Secondary | ICD-10-CM | POA: Diagnosis not present

## 2019-04-15 DIAGNOSIS — E785 Hyperlipidemia, unspecified: Secondary | ICD-10-CM | POA: Diagnosis not present

## 2019-04-15 DIAGNOSIS — R5383 Other fatigue: Secondary | ICD-10-CM | POA: Diagnosis not present

## 2019-04-15 DIAGNOSIS — D519 Vitamin B12 deficiency anemia, unspecified: Secondary | ICD-10-CM | POA: Diagnosis not present

## 2019-04-15 DIAGNOSIS — J309 Allergic rhinitis, unspecified: Secondary | ICD-10-CM | POA: Diagnosis not present

## 2019-04-15 DIAGNOSIS — F411 Generalized anxiety disorder: Secondary | ICD-10-CM | POA: Diagnosis not present

## 2019-04-15 DIAGNOSIS — I10 Essential (primary) hypertension: Secondary | ICD-10-CM | POA: Diagnosis not present

## 2019-04-15 DIAGNOSIS — E119 Type 2 diabetes mellitus without complications: Secondary | ICD-10-CM | POA: Diagnosis not present

## 2019-04-21 IMAGING — CT CT HEAD W/O CM
4 series · 15 of 47 positions shown, 17 images · non-contrast
Comparison: 02/11/2017 CT head.

CLINICAL DATA: 50 y/o  F; minor head trauma, initial exam.

EXAM:
CT HEAD WITHOUT CONTRAST
TECHNIQUE: Contiguous axial images were obtained from the base of the skull
through the vertex without intravenous contrast.

[Series 2: head wo · axial · 0.40mm/px · z∈[+564,+654]mm · 7 of 26 slices shown, 9 images]
[im 4/26  brain]
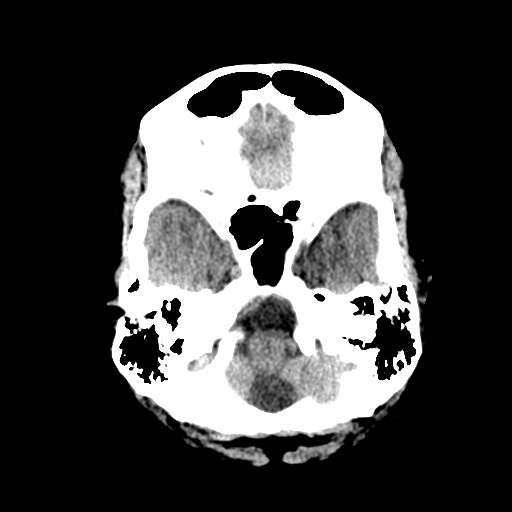
[im 4/26  bone]
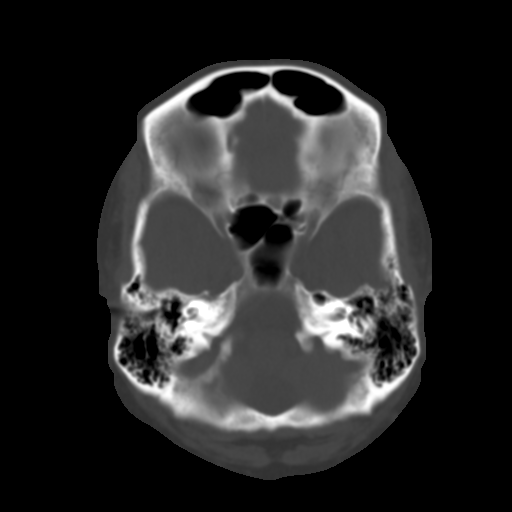
[im 7/26  brain]
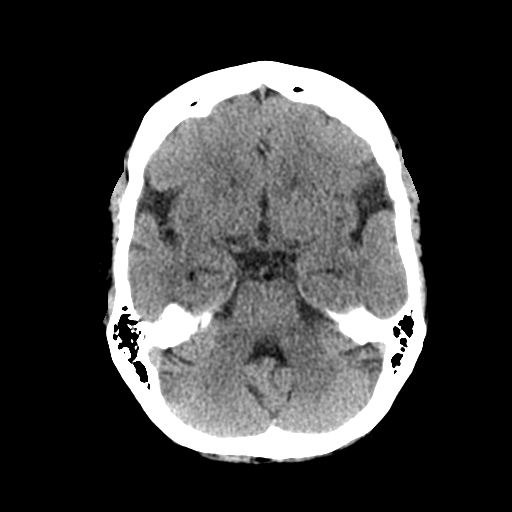
[im 10/26  brain]
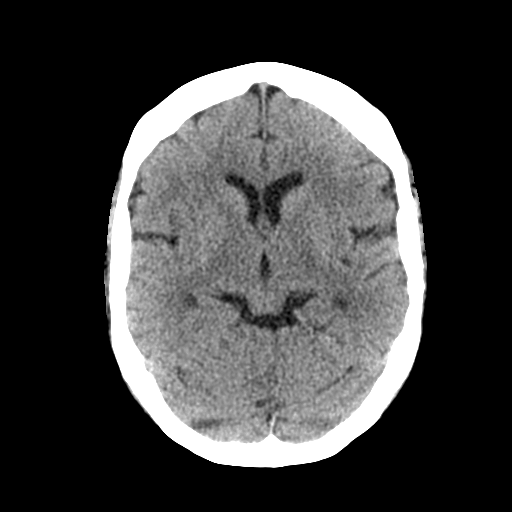
[im 13/26  brain]
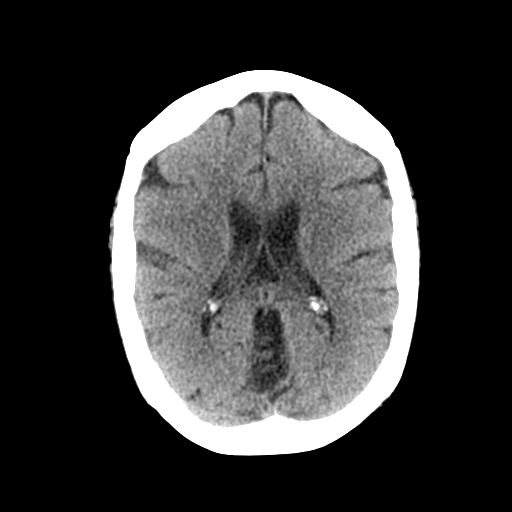
[im 16/26  brain]
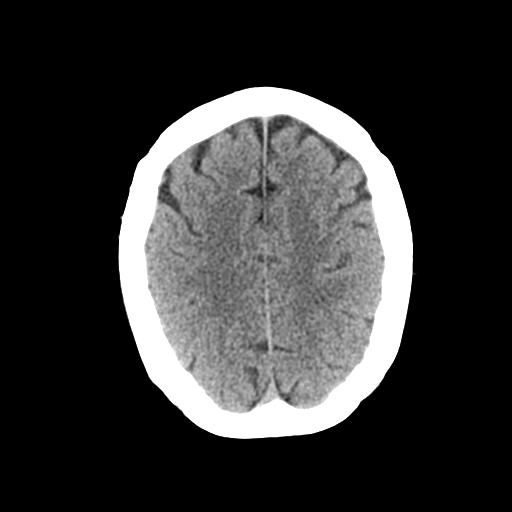
[im 16/26  bone]
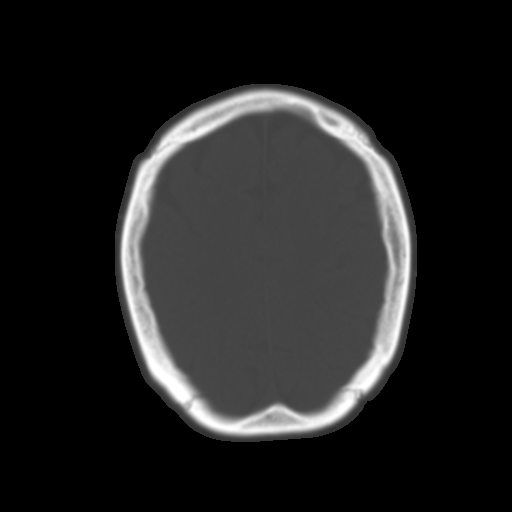
[im 19/26  brain]
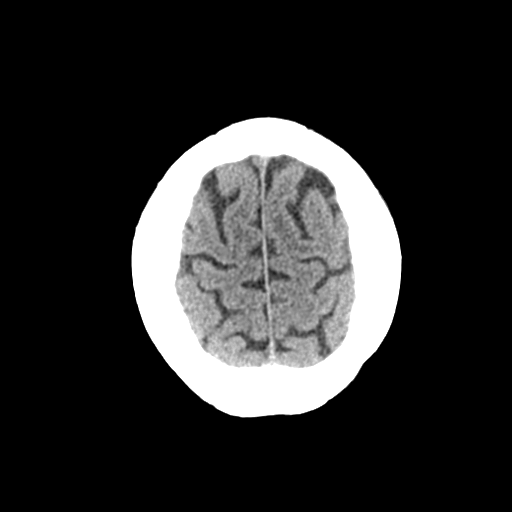
[im 22/26  brain]
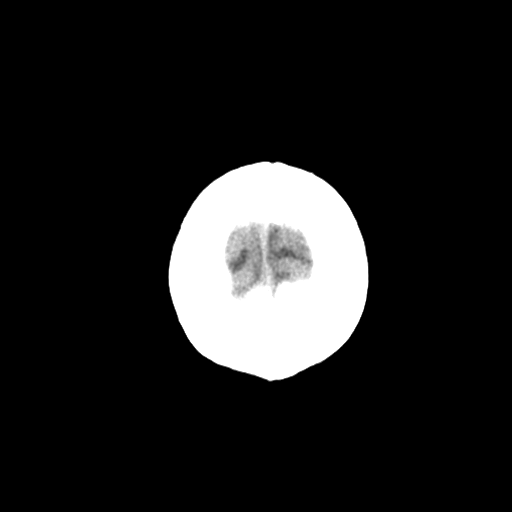

[Series 3: head bone · axial · 0.40mm/px · z∈[+561,+573]mm · 2 of 65 slices shown]
[im 7/65  bone]
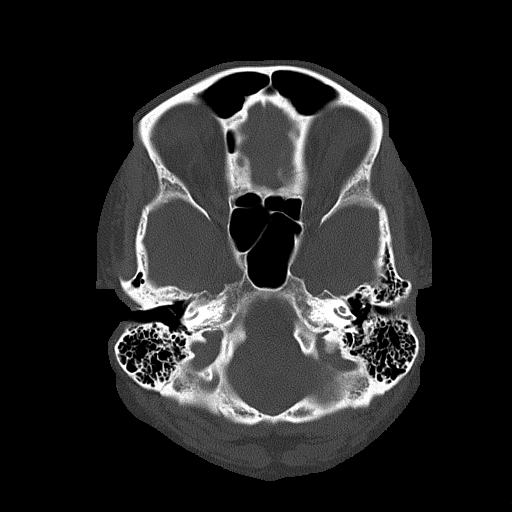
[im 13/65  bone]
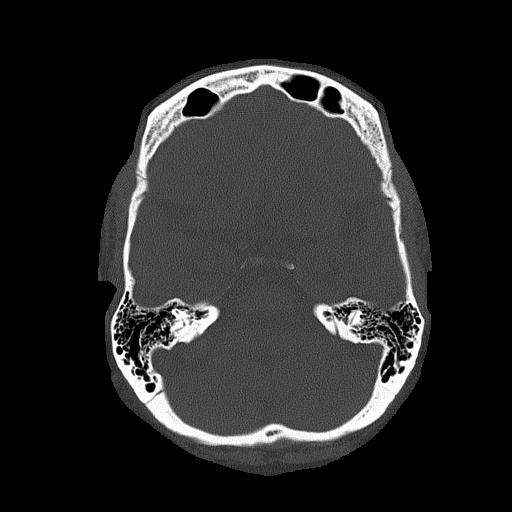

[Series 4: coronal soft tissue · coronal · 0.26mm/px · 3 of 58 slices shown]
[im 20/58  brain]
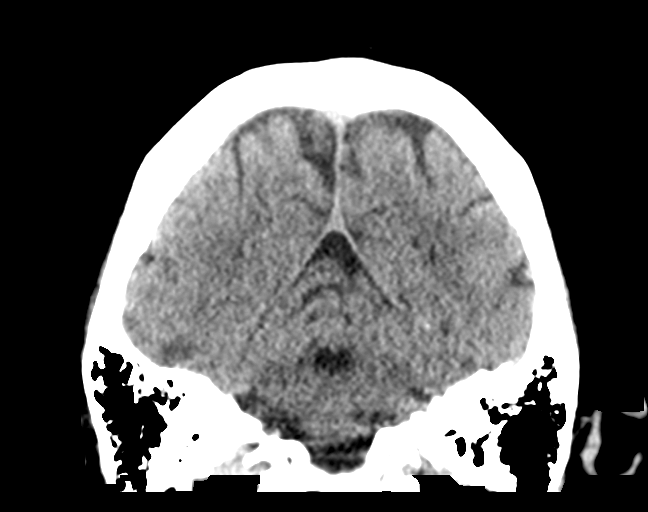
[im 26/58  brain]
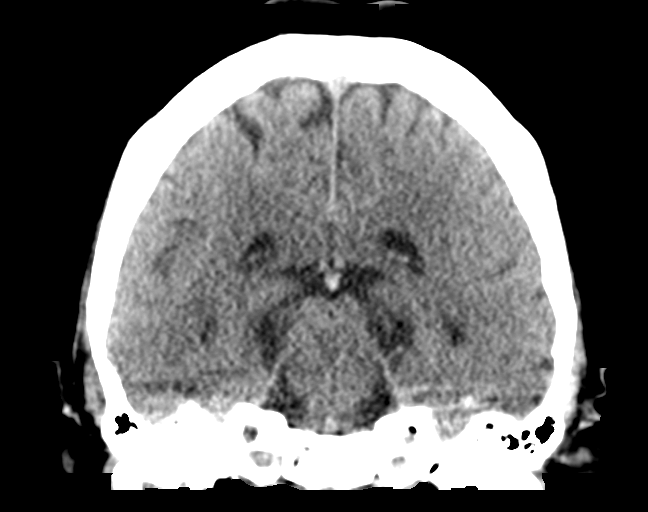
[im 32/58  brain]
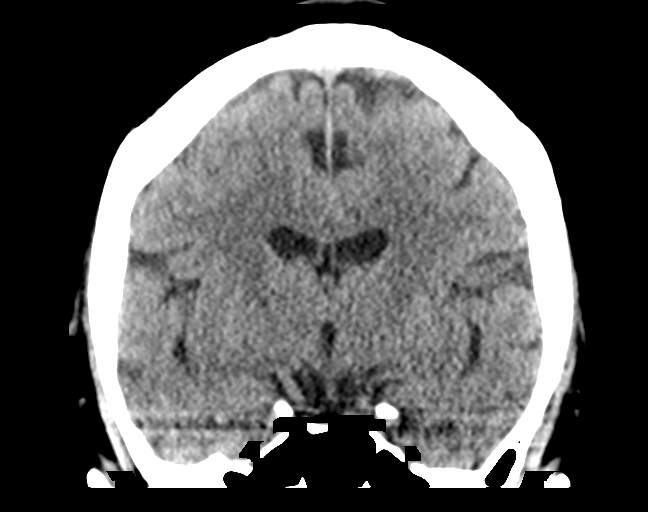

[Series 5: sagittal soft tissue · sagittal · 0.27mm/px · 3 of 47 slices shown]
[im 16/47  brain]
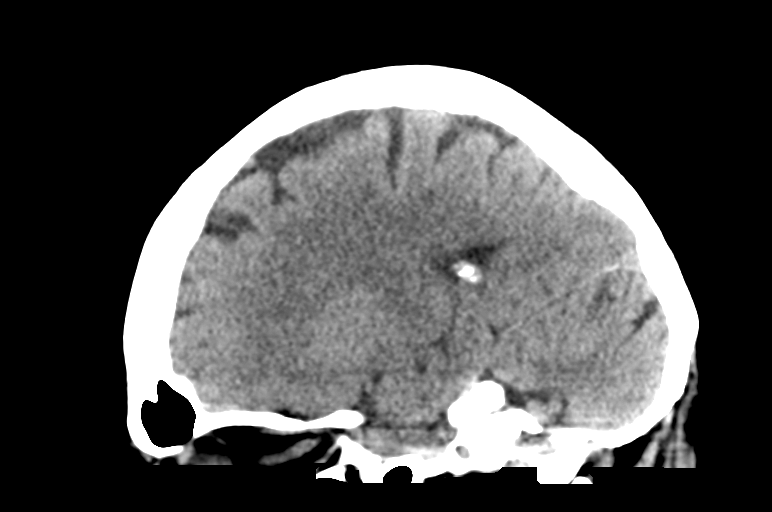
[im 24/47  brain]
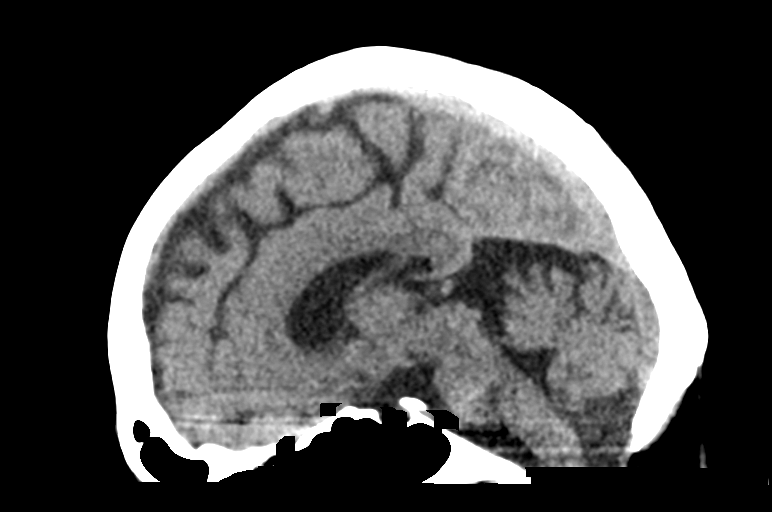
[im 31/47  brain]
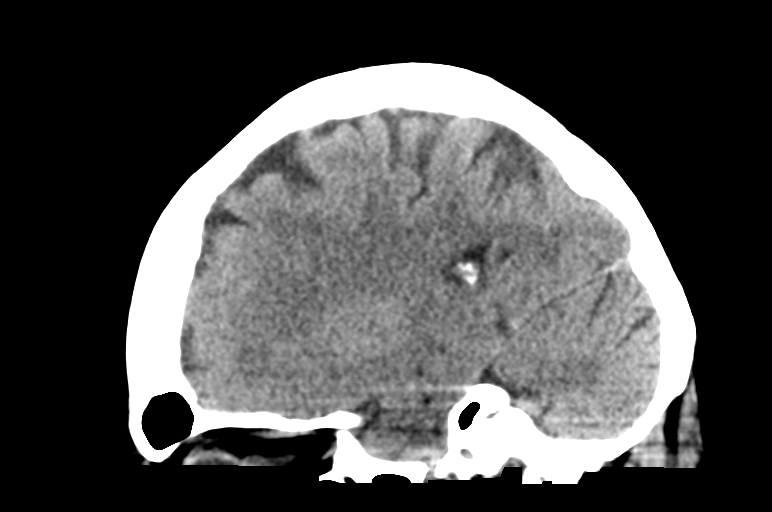

[15 of 47 positions shown; findings below may reference images not displayed]

FINDINGS: Brain: No evidence of acute infarction, hemorrhage, hydrocephalus,
extra-axial collection or mass lesion/mass effect. Mild stable brain
parenchymal volume loss for age.

Vascular: No hyperdense vessel or unexpected calcification.

Skull: Normal. Negative for fracture or focal lesion.

Sinuses/Orbits: No acute finding.

Other: None.
IMPRESSION: No acute intracranial abnormality or calvarial fracture. Mild stable
brain parenchymal volume loss for age.

By: Jinglong Anastasya M.D.

## 2019-04-29 DIAGNOSIS — K5900 Constipation, unspecified: Secondary | ICD-10-CM | POA: Diagnosis not present

## 2019-04-29 DIAGNOSIS — E785 Hyperlipidemia, unspecified: Secondary | ICD-10-CM | POA: Diagnosis not present

## 2019-04-29 DIAGNOSIS — R5383 Other fatigue: Secondary | ICD-10-CM | POA: Diagnosis not present

## 2019-04-29 DIAGNOSIS — F411 Generalized anxiety disorder: Secondary | ICD-10-CM | POA: Diagnosis not present

## 2019-04-29 DIAGNOSIS — E119 Type 2 diabetes mellitus without complications: Secondary | ICD-10-CM | POA: Diagnosis not present

## 2019-04-29 DIAGNOSIS — I10 Essential (primary) hypertension: Secondary | ICD-10-CM | POA: Diagnosis not present

## 2019-04-29 DIAGNOSIS — J309 Allergic rhinitis, unspecified: Secondary | ICD-10-CM | POA: Diagnosis not present

## 2019-04-29 DIAGNOSIS — D519 Vitamin B12 deficiency anemia, unspecified: Secondary | ICD-10-CM | POA: Diagnosis not present

## 2019-05-06 ENCOUNTER — Ambulatory Visit
Admission: RE | Admit: 2019-05-06 | Discharge: 2019-05-06 | Disposition: A | Discharge status: Home / Self Care | Payer: Medicare HMO | Source: Ambulatory Visit | Attending: Gastroenterology | Admitting: Gastroenterology

## 2019-05-06 ENCOUNTER — Other Ambulatory Visit: Payer: Self-pay

## 2019-05-06 DIAGNOSIS — R140 Abdominal distension (gaseous): Secondary | ICD-10-CM | POA: Diagnosis not present

## 2019-05-06 DIAGNOSIS — R112 Nausea with vomiting, unspecified: Secondary | ICD-10-CM | POA: Insufficient documentation

## 2019-05-06 DIAGNOSIS — R1084 Generalized abdominal pain: Secondary | ICD-10-CM | POA: Diagnosis not present

## 2019-05-06 DIAGNOSIS — N200 Calculus of kidney: Secondary | ICD-10-CM | POA: Diagnosis not present

## 2019-05-06 HISTORY — DX: Disorder of kidney and ureter, unspecified: N28.9

## 2019-05-06 LAB — POCT I-STAT CREATININE: Creatinine, Ser: 0.5 mg/dL (ref 0.44–1.00)

## 2019-05-06 MED ORDER — IOHEXOL 300 MG/ML  SOLN
100.0000 mL | Freq: Once | INTRAMUSCULAR | Status: AC | PRN
  Administered 2019-05-06: 100 mL via INTRAVENOUS

## 2019-05-15 DIAGNOSIS — Z308 Encounter for other contraceptive management: Secondary | ICD-10-CM | POA: Diagnosis not present

## 2019-05-15 DIAGNOSIS — K219 Gastro-esophageal reflux disease without esophagitis: Secondary | ICD-10-CM | POA: Diagnosis not present

## 2019-05-15 DIAGNOSIS — K5909 Other constipation: Secondary | ICD-10-CM | POA: Diagnosis not present

## 2019-05-15 DIAGNOSIS — R140 Abdominal distension (gaseous): Secondary | ICD-10-CM | POA: Diagnosis not present

## 2019-05-15 DIAGNOSIS — E538 Deficiency of other specified B group vitamins: Secondary | ICD-10-CM | POA: Diagnosis not present

## 2019-05-27 IMAGING — CR DG LUMBAR SPINE COMPLETE 4+V
1 series · 5 of 5 positions shown · non-contrast
Comparison: None.

CLINICAL DATA: Fall 3 weeks prior, persistent lumbar back pain.

EXAM:
LUMBAR SPINE - COMPLETE 4+ VIEW

[Series 1: dg lumbar spine complete 4 +v · 0.14mm/px · 5 of 5 slices shown]
[im 1/5]
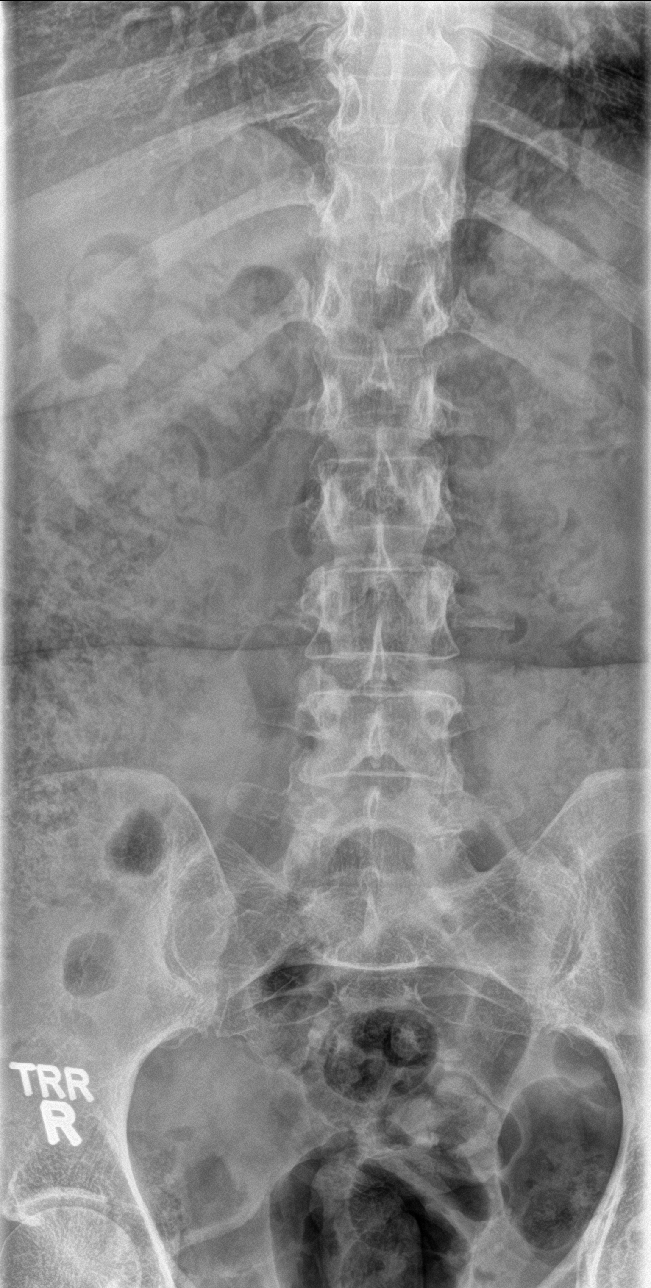
[im 2/5]
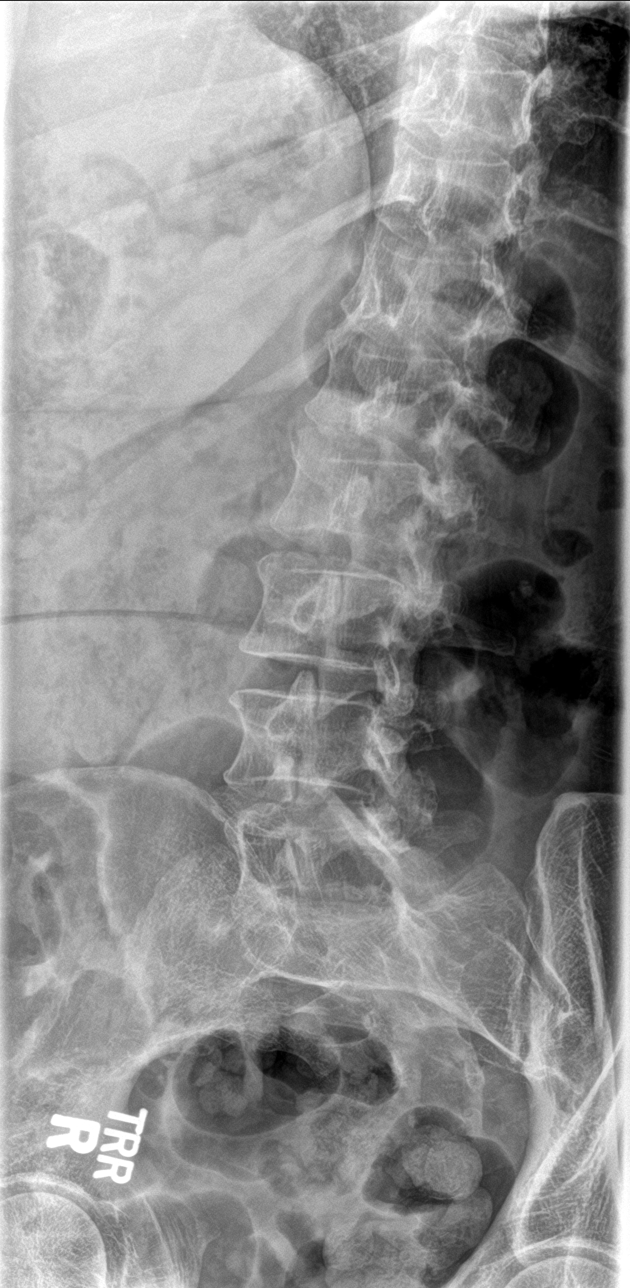
[im 3/5]
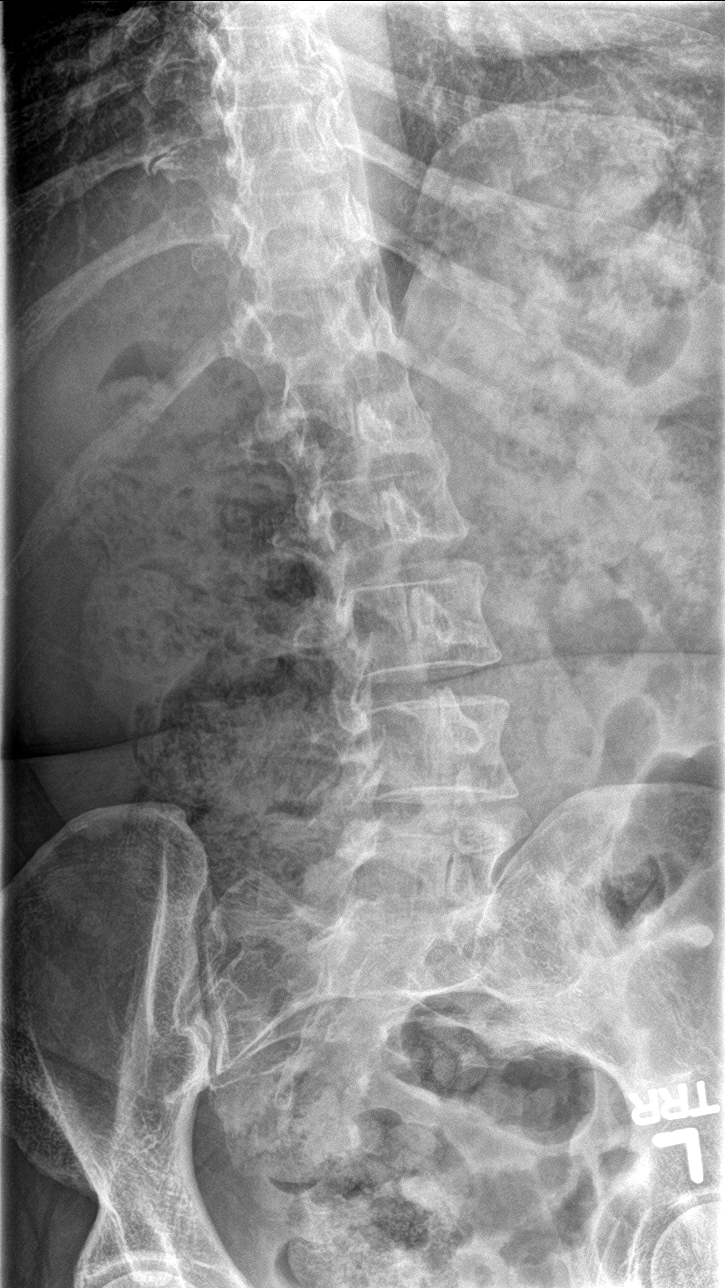
[im 4/5]
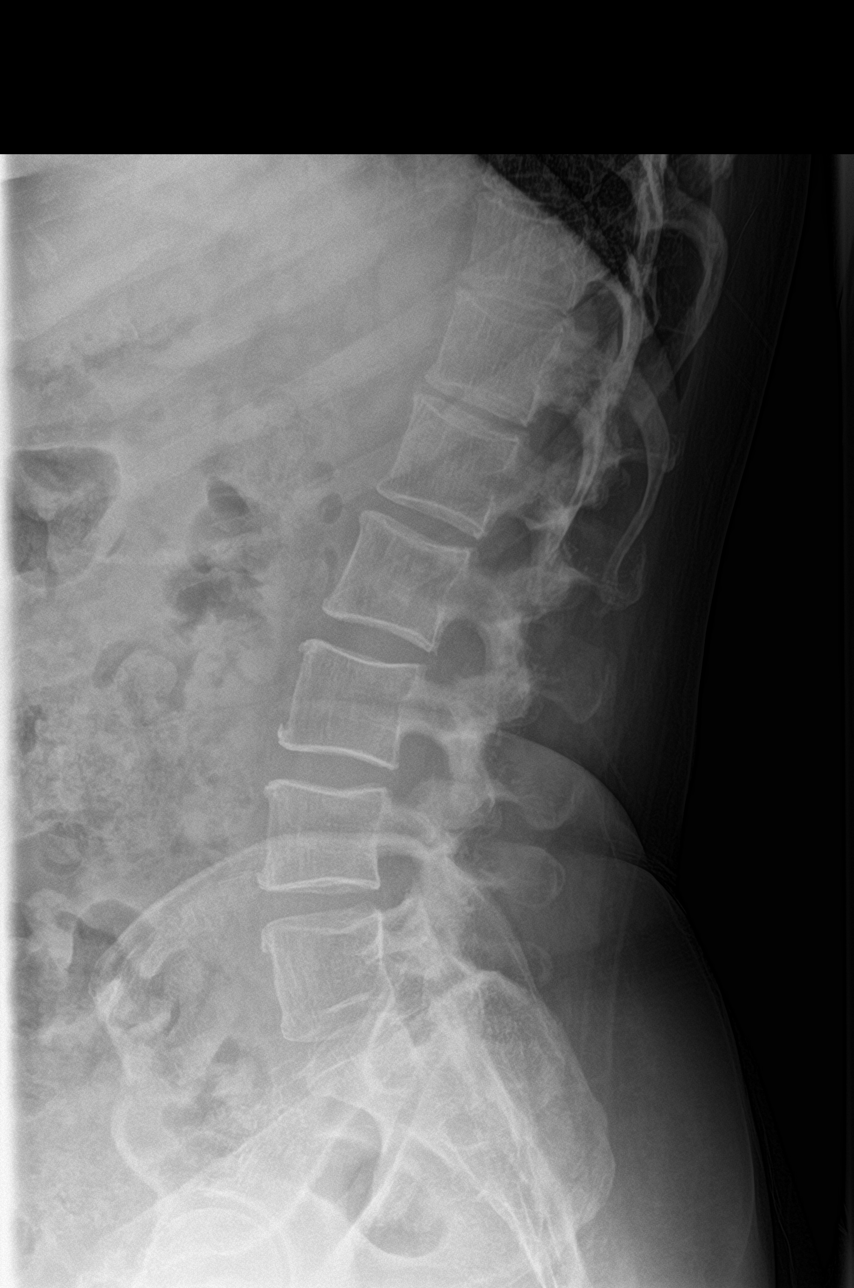
[im 5/5]
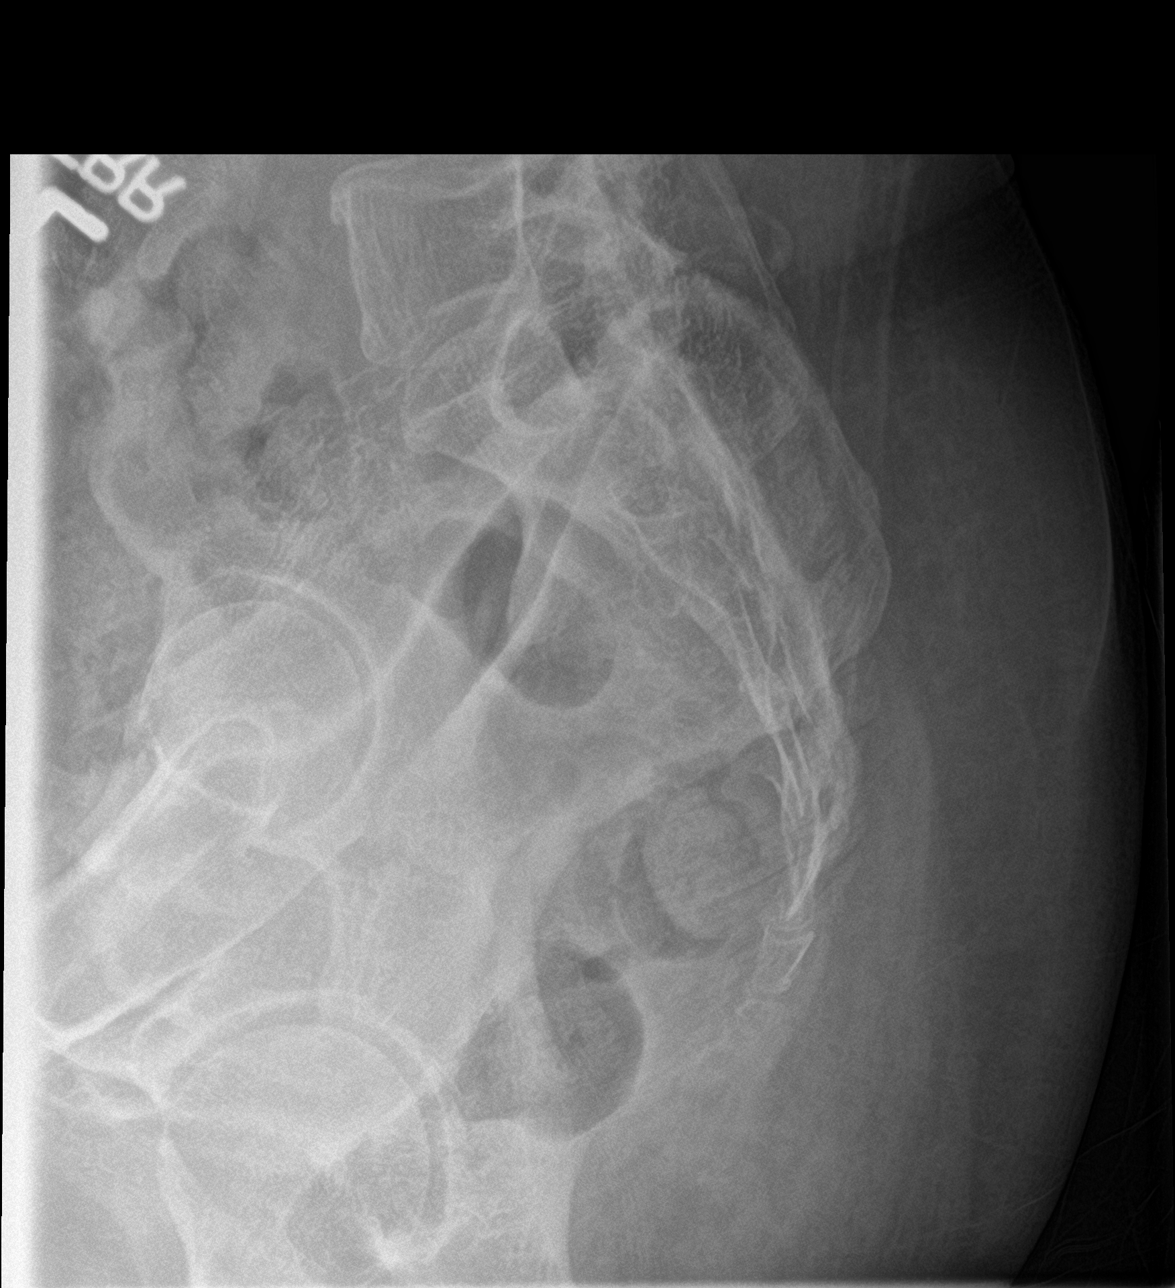

[5 of 5 positions shown; findings below may reference images not displayed]

FINDINGS: The alignment is maintained. Vertebral body heights are normal.
There is no listhesis. The posterior elements are intact. Disc
spaces are preserved, minimal endplate spurring at multiple levels.
No fracture. Sacroiliac joints are congruent.
IMPRESSION: 1. No fracture or subluxation of the lumbar spine.
2. Minimal spondylosis.

## 2019-06-04 DIAGNOSIS — N390 Urinary tract infection, site not specified: Secondary | ICD-10-CM | POA: Diagnosis not present

## 2019-06-04 DIAGNOSIS — M79662 Pain in left lower leg: Secondary | ICD-10-CM | POA: Diagnosis not present

## 2019-06-04 DIAGNOSIS — E119 Type 2 diabetes mellitus without complications: Secondary | ICD-10-CM | POA: Diagnosis not present

## 2019-06-04 DIAGNOSIS — D519 Vitamin B12 deficiency anemia, unspecified: Secondary | ICD-10-CM | POA: Diagnosis not present

## 2019-06-04 DIAGNOSIS — F411 Generalized anxiety disorder: Secondary | ICD-10-CM | POA: Diagnosis not present

## 2019-06-04 DIAGNOSIS — M79605 Pain in left leg: Secondary | ICD-10-CM | POA: Diagnosis not present

## 2019-06-04 DIAGNOSIS — R1010 Upper abdominal pain, unspecified: Secondary | ICD-10-CM | POA: Diagnosis not present

## 2019-06-11 DIAGNOSIS — M79662 Pain in left lower leg: Secondary | ICD-10-CM | POA: Diagnosis not present

## 2019-06-17 DIAGNOSIS — G4489 Other headache syndrome: Secondary | ICD-10-CM | POA: Diagnosis not present

## 2019-06-17 DIAGNOSIS — E119 Type 2 diabetes mellitus without complications: Secondary | ICD-10-CM | POA: Diagnosis not present

## 2019-06-17 DIAGNOSIS — E785 Hyperlipidemia, unspecified: Secondary | ICD-10-CM | POA: Diagnosis not present

## 2019-06-17 DIAGNOSIS — D519 Vitamin B12 deficiency anemia, unspecified: Secondary | ICD-10-CM | POA: Diagnosis not present

## 2019-06-17 DIAGNOSIS — M79605 Pain in left leg: Secondary | ICD-10-CM | POA: Diagnosis not present

## 2019-06-17 DIAGNOSIS — K219 Gastro-esophageal reflux disease without esophagitis: Secondary | ICD-10-CM | POA: Diagnosis not present

## 2019-06-17 DIAGNOSIS — N390 Urinary tract infection, site not specified: Secondary | ICD-10-CM | POA: Diagnosis not present

## 2019-06-17 DIAGNOSIS — R5383 Other fatigue: Secondary | ICD-10-CM | POA: Diagnosis not present

## 2019-06-24 DIAGNOSIS — K219 Gastro-esophageal reflux disease without esophagitis: Secondary | ICD-10-CM | POA: Diagnosis not present

## 2019-06-24 DIAGNOSIS — K5909 Other constipation: Secondary | ICD-10-CM | POA: Diagnosis not present

## 2019-06-24 DIAGNOSIS — R140 Abdominal distension (gaseous): Secondary | ICD-10-CM | POA: Diagnosis not present

## 2019-06-24 DIAGNOSIS — R4189 Other symptoms and signs involving cognitive functions and awareness: Secondary | ICD-10-CM | POA: Diagnosis not present

## 2019-06-24 DIAGNOSIS — K5900 Constipation, unspecified: Secondary | ICD-10-CM | POA: Diagnosis not present

## 2019-06-24 DIAGNOSIS — R110 Nausea: Secondary | ICD-10-CM | POA: Diagnosis not present

## 2019-06-26 DIAGNOSIS — E785 Hyperlipidemia, unspecified: Secondary | ICD-10-CM | POA: Diagnosis not present

## 2019-06-26 DIAGNOSIS — K219 Gastro-esophageal reflux disease without esophagitis: Secondary | ICD-10-CM | POA: Diagnosis not present

## 2019-06-26 DIAGNOSIS — I10 Essential (primary) hypertension: Secondary | ICD-10-CM | POA: Diagnosis not present

## 2019-06-26 DIAGNOSIS — R0602 Shortness of breath: Secondary | ICD-10-CM | POA: Diagnosis not present

## 2019-07-02 DIAGNOSIS — I10 Essential (primary) hypertension: Secondary | ICD-10-CM | POA: Diagnosis not present

## 2019-07-04 DIAGNOSIS — R319 Hematuria, unspecified: Secondary | ICD-10-CM | POA: Diagnosis not present

## 2019-07-04 DIAGNOSIS — N200 Calculus of kidney: Secondary | ICD-10-CM | POA: Diagnosis not present

## 2019-07-04 DIAGNOSIS — R809 Proteinuria, unspecified: Secondary | ICD-10-CM | POA: Diagnosis not present

## 2019-07-04 DIAGNOSIS — I10 Essential (primary) hypertension: Secondary | ICD-10-CM | POA: Diagnosis not present

## 2019-07-16 DIAGNOSIS — J4542 Moderate persistent asthma with status asthmaticus: Secondary | ICD-10-CM | POA: Diagnosis not present

## 2019-07-17 DIAGNOSIS — E785 Hyperlipidemia, unspecified: Secondary | ICD-10-CM | POA: Diagnosis not present

## 2019-07-17 DIAGNOSIS — E119 Type 2 diabetes mellitus without complications: Secondary | ICD-10-CM | POA: Diagnosis not present

## 2019-07-17 DIAGNOSIS — R5383 Other fatigue: Secondary | ICD-10-CM | POA: Diagnosis not present

## 2019-07-17 DIAGNOSIS — I10 Essential (primary) hypertension: Secondary | ICD-10-CM | POA: Diagnosis not present

## 2019-07-17 DIAGNOSIS — D519 Vitamin B12 deficiency anemia, unspecified: Secondary | ICD-10-CM | POA: Diagnosis not present

## 2019-07-22 ENCOUNTER — Other Ambulatory Visit: Payer: Self-pay | Admitting: Nurse Practitioner

## 2019-07-22 DIAGNOSIS — E119 Type 2 diabetes mellitus without complications: Secondary | ICD-10-CM | POA: Diagnosis not present

## 2019-07-22 DIAGNOSIS — D519 Vitamin B12 deficiency anemia, unspecified: Secondary | ICD-10-CM | POA: Diagnosis not present

## 2019-07-22 DIAGNOSIS — Z1231 Encounter for screening mammogram for malignant neoplasm of breast: Secondary | ICD-10-CM

## 2019-07-22 DIAGNOSIS — E785 Hyperlipidemia, unspecified: Secondary | ICD-10-CM | POA: Diagnosis not present

## 2019-07-22 DIAGNOSIS — N390 Urinary tract infection, site not specified: Secondary | ICD-10-CM | POA: Diagnosis not present

## 2019-07-22 DIAGNOSIS — M1990 Unspecified osteoarthritis, unspecified site: Secondary | ICD-10-CM | POA: Diagnosis not present

## 2019-07-22 DIAGNOSIS — F411 Generalized anxiety disorder: Secondary | ICD-10-CM | POA: Diagnosis not present

## 2019-08-02 DIAGNOSIS — R300 Dysuria: Secondary | ICD-10-CM | POA: Insufficient documentation

## 2019-08-02 DIAGNOSIS — R531 Weakness: Secondary | ICD-10-CM | POA: Diagnosis not present

## 2019-08-02 DIAGNOSIS — Z7901 Long term (current) use of anticoagulants: Secondary | ICD-10-CM | POA: Insufficient documentation

## 2019-08-02 DIAGNOSIS — E119 Type 2 diabetes mellitus without complications: Secondary | ICD-10-CM | POA: Diagnosis not present

## 2019-08-02 DIAGNOSIS — J40 Bronchitis, not specified as acute or chronic: Secondary | ICD-10-CM | POA: Insufficient documentation

## 2019-08-02 DIAGNOSIS — R109 Unspecified abdominal pain: Secondary | ICD-10-CM | POA: Diagnosis not present

## 2019-08-02 DIAGNOSIS — E1165 Type 2 diabetes mellitus with hyperglycemia: Secondary | ICD-10-CM | POA: Diagnosis not present

## 2019-08-02 DIAGNOSIS — Z7984 Long term (current) use of oral hypoglycemic drugs: Secondary | ICD-10-CM | POA: Diagnosis not present

## 2019-08-02 DIAGNOSIS — Z20828 Contact with and (suspected) exposure to other viral communicable diseases: Secondary | ICD-10-CM | POA: Diagnosis not present

## 2019-08-02 DIAGNOSIS — K5900 Constipation, unspecified: Secondary | ICD-10-CM | POA: Diagnosis not present

## 2019-08-02 DIAGNOSIS — Z79899 Other long term (current) drug therapy: Secondary | ICD-10-CM | POA: Diagnosis not present

## 2019-08-02 DIAGNOSIS — R1030 Lower abdominal pain, unspecified: Secondary | ICD-10-CM | POA: Diagnosis present

## 2019-08-02 DIAGNOSIS — R05 Cough: Secondary | ICD-10-CM | POA: Diagnosis not present

## 2019-08-02 LAB — GLUCOSE, CAPILLARY
Glucose-Capillary: 142 mg/dL — ABNORMAL HIGH (ref 70–99)
Glucose-Capillary: 145 mg/dL — ABNORMAL HIGH (ref 70–99)

## 2019-08-02 NOTE — ED Triage Notes (Addendum)
Pt finished antibiotics today for UTI; adds her urinary symptoms continue-dysuria and low midline abd pain; says she's been sweating and her shirt is wet; pt says her boyfriend checks on her at her apt and she's been weak and groggy; says he had to help her get her wet clothes off earlier; pt answering questions without difficulty; EMS had reported CBG of 258; here her BS is 145, recheck 142

## 2019-08-02 NOTE — ED Notes (Signed)
Patient to waiting room via wheelchair by EMS from home.  Per EMS patient recently treated for a UTI and finished antibiotics earlier today.  Continued fever, generalized malaise and sleeping a lot.  EMS - cbg 258, temp 99.5 oral.

## 2019-08-03 ENCOUNTER — Emergency Department: Payer: Medicare HMO

## 2019-08-03 ENCOUNTER — Other Ambulatory Visit: Payer: Self-pay

## 2019-08-03 ENCOUNTER — Emergency Department
Admission: EM | Admit: 2019-08-03 | Discharge: 2019-08-03 | Disposition: A | Discharge status: Home / Self Care | Payer: Medicare HMO | Attending: Emergency Medicine | Admitting: Emergency Medicine

## 2019-08-03 ENCOUNTER — Encounter: Payer: Self-pay | Admitting: Emergency Medicine

## 2019-08-03 DIAGNOSIS — K5900 Constipation, unspecified: Secondary | ICD-10-CM

## 2019-08-03 DIAGNOSIS — R05 Cough: Secondary | ICD-10-CM | POA: Diagnosis not present

## 2019-08-03 DIAGNOSIS — R1030 Lower abdominal pain, unspecified: Secondary | ICD-10-CM

## 2019-08-03 DIAGNOSIS — R109 Unspecified abdominal pain: Secondary | ICD-10-CM | POA: Diagnosis not present

## 2019-08-03 DIAGNOSIS — R300 Dysuria: Secondary | ICD-10-CM

## 2019-08-03 DIAGNOSIS — J40 Bronchitis, not specified as acute or chronic: Secondary | ICD-10-CM

## 2019-08-03 LAB — COMPREHENSIVE METABOLIC PANEL
ALT: 20 U/L (ref 0–44)
AST: 15 U/L (ref 15–41)
Albumin: 4.6 g/dL (ref 3.5–5.0)
Alkaline Phosphatase: 65 U/L (ref 38–126)
Anion gap: 13 (ref 5–15)
BUN: 14 mg/dL (ref 6–20)
CO2: 22 mmol/L (ref 22–32)
Calcium: 10 mg/dL (ref 8.9–10.3)
Chloride: 106 mmol/L (ref 98–111)
Creatinine, Ser: 0.51 mg/dL (ref 0.44–1.00)
GFR calc Af Amer: >60 mL/min (ref >60)
GFR calc non Af Amer: >60 mL/min (ref >60)
Glucose, Bld: 145 mg/dL — ABNORMAL HIGH (ref 70–99)
Potassium: 3.9 mmol/L (ref 3.5–5.1)
Sodium: 141 mmol/L (ref 135–145)
Total Bilirubin: 0.2 mg/dL — ABNORMAL LOW (ref 0.3–1.2)
Total Protein: 7.1 g/dL (ref 6.5–8.1)

## 2019-08-03 LAB — URINALYSIS, COMPLETE (UACMP) WITH MICROSCOPIC
Bilirubin Urine: NEGATIVE
Glucose, UA: NEGATIVE mg/dL
Hgb urine dipstick: NEGATIVE
Ketones, ur: NEGATIVE mg/dL
Leukocytes,Ua: NEGATIVE
Nitrite: NEGATIVE
Protein, ur: 30 mg/dL — AB
Specific Gravity, Urine: 1.027 (ref 1.005–1.030)
pH: 5 (ref 5.0–8.0)

## 2019-08-03 LAB — CBC
HCT: 41.3 % (ref 36.0–46.0)
Hemoglobin: 13.3 g/dL (ref 12.0–15.0)
MCH: 29.4 pg (ref 26.0–34.0)
MCHC: 32.2 g/dL (ref 30.0–36.0)
MCV: 91.2 fL (ref 80.0–100.0)
Platelets: 298 10*3/uL (ref 150–400)
RBC: 4.53 MIL/uL (ref 3.87–5.11)
RDW: 13.8 % (ref 11.5–15.5)
WBC: 8 10*3/uL (ref 4.0–10.5)
nRBC: 0 % (ref 0.0–0.2)

## 2019-08-03 LAB — SARS CORONAVIRUS 2 BY RT PCR (HOSPITAL ORDER, PERFORMED IN ~~LOC~~ HOSPITAL LAB): SARS Coronavirus 2: NEGATIVE

## 2019-08-03 LAB — TROPONIN I (HIGH SENSITIVITY): Troponin I (High Sensitivity): <2 ng/L (ref <18)

## 2019-08-03 MED ORDER — AZITHROMYCIN 250 MG PO TABS: ORAL_TABLET | ORAL | 0 refills | Status: DC

## 2019-08-03 MED ORDER — IOHEXOL 240 MG/ML SOLN
50.0000 mL | Freq: Once | INTRAMUSCULAR | Status: AC
  Administered 2019-08-03: 50 mL via ORAL

## 2019-08-03 MED ORDER — LACTULOSE 10 GM/15ML PO SOLN: 20.0000 g | Freq: Every day | ORAL | 0 refills | Status: AC | PRN

## 2019-08-03 MED ORDER — IOHEXOL 300 MG/ML  SOLN
100.0000 mL | Freq: Once | INTRAMUSCULAR | Status: AC | PRN
  Administered 2019-08-03: 100 mL via INTRAVENOUS

## 2019-08-03 MED ORDER — SODIUM CHLORIDE 0.9 % IV BOLUS
1000.0000 mL | Freq: Once | INTRAVENOUS | Status: AC
  Administered 2019-08-03: 02:00:00 1000 mL via INTRAVENOUS

## 2019-08-03 NOTE — ED Notes (Signed)
Attempt to call pt's mother for ride home per pt request without success. No answer at mother's phone number. Not able to secure a safe discharge at this time. md notified. Pt states her boyfriend will not be able to come and pick her up. Pt does not have keys to her apartment and is not sure how she will get into apartment if she takes a taxi home. Will continue to attempt to reach pt's mother for ride.

## 2019-08-03 NOTE — ED Notes (Signed)
Talked to Janett Billow [phone] the Assistant Director at [employer], the community that this pt is a part of. She is seeking transport for this pt to get this home, she will call me back if she can get transport. This RN will still try and call the mother to see if she could pick the pt up.

## 2019-08-03 NOTE — ED Notes (Signed)
Report to ashley, rn

## 2019-08-03 NOTE — ED Notes (Signed)
MD in to see pt.

## 2019-08-03 NOTE — ED Provider Notes (Signed)
[facility] Emergency Department Provider Note   ____________________________________________   First MD Initiated Contact with Patient 08/03/19 0157     (approximate)  I have reviewed the triage vital signs and the nursing notes.   HISTORY  Chief Complaint Dysuria, Fatigue, and Abdominal Pain    HPI Hannah Clarke is a 53 y.o. female who presents to the ED from home with a chief complaint of dysuria, suprapubic abdominal pain, fatigue and diaphoresis.  Patient states she finished antibiotics today for UTI.  Continues to have symptoms.  States she has been sweating through her shirt.  Also complaining of dry cough.  Denies fever, chest pain, shortness of breath, nausea, vomiting or diarrhea.       Past Medical History:  Diagnosis Date   Anxiety    Chronic headaches    Depression    Dermatophytosis of nail    Diabetes mellitus without complication (HCC)    GERD (gastroesophageal reflux disease)    High cholesterol    Hypertension    Mental developmental delay    Onychia of toe    Panic attacks    Renal insufficiency     Patient Active Problem List   Diagnosis Date Noted   Intractable nausea and vomiting 08/01/2018   Unstable angina ([place]) 08/17/2017    Past Surgical History:  Procedure Laterality Date   COLONOSCOPY WITH PROPOFOL N/A 10/09/2017   Procedure: COLONOSCOPY WITH PROPOFOL;  Surgeon: Toledo, Benay Pike, MD;  Location: [facility] ENDOSCOPY;  Service: Gastroenterology;  Laterality: N/A;   COLONOSCOPY WITH PROPOFOL N/A 02/06/2018   Procedure: COLONOSCOPY WITH PROPOFOL;  Surgeon: Toledo, Benay Pike, MD;  Location: [facility] ENDOSCOPY;  Service: Gastroenterology;  Laterality: N/A;   ESOPHAGOGASTRODUODENOSCOPY (EGD) WITH PROPOFOL N/A 10/09/2017   Procedure: ESOPHAGOGASTRODUODENOSCOPY (EGD) WITH PROPOFOL;  Surgeon: Toledo, Benay Pike, MD;  Location: [facility] ENDOSCOPY;  Service: Gastroenterology;  Laterality: N/A;   ESOPHAGOGASTRODUODENOSCOPY  (EGD) WITH PROPOFOL N/A 07/31/2018   Procedure: ESOPHAGOGASTRODUODENOSCOPY (EGD) WITH PROPOFOL;  Surgeon: Toledo, Benay Pike, MD;  Location: [facility] ENDOSCOPY;  Service: Gastroenterology;  Laterality: N/A;   HAND SURGERY     KIDNEY STONE SURGERY      Prior to Admission medications   Medication Sig Start Date End Date Taking? Authorizing Provider  acetaminophen (TYLENOL) 325 MG tablet Take 325 mg by mouth every 6 (six) hours as needed.    [provider]  albuterol (PROVENTIL HFA;VENTOLIN HFA) 108 (90 BASE) MCG/ACT inhaler Inhale 2 puffs into the lungs every 6 (six) hours as needed for wheezing or shortness of breath. 11/07/15   Orbie Pyo, MD  amLODipine (NORVASC) 5 MG tablet Take 5 mg by mouth daily.    [provider]  azithromycin (ZITHROMAX Z-PAK) 250 MG tablet Take as directed 08/03/19   Paulette Blanch, MD  cetirizine (ZYRTEC) 10 MG tablet Take 10 mg by mouth daily.    [provider]  cholecalciferol (VITAMIN D) 1000 units tablet Take 1,000 Units by mouth daily.    [provider]  citalopram (CELEXA) 20 MG tablet Take 20 mg by mouth daily.    [provider]  clopidogrel (PLAVIX) 75 MG tablet Take 75 mg by mouth daily.    [provider]  dexlansoprazole (DEXILANT) 60 MG capsule Take 60 mg by mouth daily.    [provider]  lactulose (CHRONULAC) 10 GM/15ML solution Take 30 mLs (20 g total) by mouth daily as needed for mild constipation. 08/03/19   Paulette Blanch, MD  LORazepam Francee Gentile)  1 MG tablet Take 1 tablet (1 mg total) by mouth every 8 (eight) hours as needed for anxiety. 02/03/16   Paulette Blanch, MD  medroxyPROGESTERone (DEPO-PROVERA) 150 MG/ML injection Inject 150 mg into the muscle every 3 (three) months.    [provider]  metFORMIN (GLUCOPHAGE) 500 MG tablet Take 500 mg by mouth 2 (two) times daily with a meal.    [provider]  metoCLOPramide (REGLAN) 10 MG tablet Take 1 tablet (10 mg total)  by mouth 3 (three) times daily before meals. 08/04/18 10/03/18  Gladstone Lighter, MD  ondansetron (ZOFRAN ODT) 4 MG disintegrating tablet Take 1 tablet (4 mg total) by mouth every 8 (eight) hours as needed for nausea or vomiting. 08/01/18   Harvest Dark, MD  polyethylene glycol (MIRALAX / GLYCOLAX) packet Take 17 g by mouth every other day.    [provider]  potassium chloride SA (K-DUR,KLOR-CON) 20 MEQ tablet Take 20 mEq by mouth daily.    [provider]  sennosides-docusate sodium (SENOKOT-S) 8.6-50 MG tablet Take 1 tablet by mouth daily.    [provider]  simvastatin (ZOCOR) 20 MG tablet Take 20 mg by mouth daily.    [provider]  sucralfate (CARAFATE) 1 g tablet Take 1 g by mouth 4 (four) times daily -  with meals and at bedtime.    [provider]    Allergies Patient has no known allergies.  Family History  Problem Relation Age of Onset   Breast cancer Mother 30       pre pt.     Social History Social History   Tobacco Use   Smoking status: Never Smoker   Smokeless tobacco: Never Used  Substance Use Topics   Alcohol use: Yes    Comment: OCCASSIONALLY   Drug use: No    Review of Systems  Constitutional: No fever/chills Eyes: No visual changes. ENT: No sore throat. Cardiovascular: Denies chest pain. Respiratory: Positive for cough.  Denies shortness of breath. Gastrointestinal: Positive for suprapubic abdominal pain.  No nausea, no vomiting.  No diarrhea.  No constipation. Genitourinary: Positive for dysuria. Musculoskeletal: Negative for back pain. Skin: Negative for rash. Neurological: Negative for headaches, focal weakness or numbness.   ____________________________________________   PHYSICAL EXAM:  VITAL SIGNS: ED Triage Vitals  Enc Vitals Group     BP 08/02/19 2354 (!) 135/102     Pulse Rate 08/02/19 2354 81     Resp 08/02/19 2354 17     Temp 08/02/19 2354 98.8 F (37.1 C)     Temp Source  08/02/19 2354 Oral     SpO2 08/02/19 2354 95 %     Weight 08/03/19 0005 171 lb (77.6 kg)     Height 08/03/19 0005 5\' 2"  (1.575 m)     Head Circumference --      Peak Flow --      Pain Score --      Pain Loc --      Pain Edu? --      Excl. in [place]? --     Constitutional: Alert and oriented. Well appearing and in mild acute distress. Eyes: Conjunctivae are normal. PERRL. EOMI. Head: Atraumatic. Nose: No congestion/rhinnorhea. Mouth/Throat: Mucous membranes are moist.  Oropharynx non-erythematous. Neck: No stridor.   Cardiovascular: Normal rate, regular rhythm. Grossly normal heart sounds.  Good peripheral circulation. Respiratory: Normal respiratory effort.  No retractions. Lungs CTAB. Gastrointestinal: Soft and mildly tender to palpation lower abdomen without rebound or guarding. No distention.  No abdominal bruits. No CVA tenderness. Musculoskeletal: No lower extremity tenderness nor edema.  No joint effusions. Neurologic:  Normal speech and language. No gross focal neurologic deficits are appreciated. No gait instability. Skin:  Skin is warm, dry and intact. No rash noted. Psychiatric: Mood and affect are normal. Speech and behavior are normal.  ____________________________________________   LABS (all labs ordered are listed, but only abnormal results are displayed)  Labs Reviewed  GLUCOSE, CAPILLARY - Abnormal; Notable for the following components:      Result Value   Glucose-Capillary 145 (*)    All other components within normal limits  GLUCOSE, CAPILLARY - Abnormal; Notable for the following components:   Glucose-Capillary 142 (*)    All other components within normal limits  COMPREHENSIVE METABOLIC PANEL - Abnormal; Notable for the following components:   Glucose, Bld 145 (*)    Total Bilirubin 0.2 (*)    All other components within normal limits  URINALYSIS, COMPLETE (UACMP) WITH MICROSCOPIC - Abnormal; Notable for the following components:   Color, Urine YELLOW (*)      APPearance HAZY (*)    Protein, ur 30 (*)    Bacteria, UA RARE (*)    All other components within normal limits  SARS CORONAVIRUS 2 (HOSPITAL ORDER, [facility])  CBC  TROPONIN I (HIGH SENSITIVITY)   ____________________________________________  EKG  ED ECG REPORT I, Javante Nilsson J, the attending physician, personally viewed and interpreted this ECG.   Date: 08/03/2019  EKG Time: 0233  Rate: 68  Rhythm: normal EKG, normal sinus rhythm  Axis: Normal  Intervals:none  ST&T Change: Nonspecific  ____________________________________________  RADIOLOGY  ED MD interpretation:  Stool in distal colon; CXR interstitial opacity  Official radiology report(s): Ct Abdomen Pelvis W Contrast  Result Date: 08/03/2019 CLINICAL DATA:  Lower abdominal pain EXAM: CT ABDOMEN AND PELVIS WITH CONTRAST TECHNIQUE: Multidetector CT imaging of the abdomen and pelvis was performed using the standard protocol following bolus administration of intravenous contrast. CONTRAST:  169mL OMNIPAQUE IOHEXOL 300 MG/ML  SOLN COMPARISON:  CT abdomen pelvis 05/06/2019 FINDINGS: LOWER CHEST: There is no basilar pleural or apical pericardial effusion. HEPATOBILIARY: The hepatic contours and density are normal. There is no intra- or extrahepatic biliary dilatation. The gallbladder is normal. PANCREAS: The pancreatic parenchymal contours are normal and there is no ductal dilatation. There is no peripancreatic fluid collection. SPLEEN: Normal. ADRENALS/URINARY TRACT: --Adrenal glands: Normal. --Right kidney/ureter: No hydronephrosis, nephroureterolithiasis, perinephric stranding or solid renal mass. --Left kidney/ureter: Nonobstructing lower pole calculus measures 4 mm --Urinary bladder: Normal for degree of distention STOMACH/BOWEL: --Stomach/Duodenum: There is no hiatal hernia or other gastric abnormality. The duodenal course and caliber are normal. --Small bowel: No dilatation or inflammation. --Colon:  Large amount of stool in the distal colon. --Appendix: Normal. VASCULAR/LYMPHATIC: Normal course and caliber of the major abdominal vessels. No abdominal or pelvic lymphadenopathy. REPRODUCTIVE: Normal uterus. No adnexal mass. MUSCULOSKELETAL. No bony spinal canal stenosis or focal osseous abnormality. OTHER: None. IMPRESSION: 1. No acute abdominal or pelvic abnormality. 2. Large amount of stool in the distal colon. Electronically Signed   By: Ulyses Jarred M.D.   On: 08/03/2019 04:34   Dg Chest Port 1 View  Result Date: 08/03/2019 CLINICAL DATA:  Cough EXAM: PORTABLE CHEST 1 VIEW COMPARISON:  06/14/2018 FINDINGS: Diffusely increased interstitial opacity. No consolidation or effusion. Normal heart size. No pneumothorax. IMPRESSION: Diffusely increased interstitial opacity suggesting interstitial inflammatory process or possible atypical infection Electronically Signed   By: Donavan Foil  M.D.   On: 08/03/2019 03:32    ____________________________________________   PROCEDURES  Procedure(s) performed (including Critical Care):  Procedures   ____________________________________________   INITIAL IMPRESSION / ASSESSMENT AND PLAN / ED COURSE  As part of my medical decision making, I reviewed the following data within the [place] notes reviewed and incorporated, Labs reviewed, EKG interpreted, Old chart reviewed, Radiograph reviewed and Notes from prior ED visits     Hannah Clarke was evaluated in Emergency Department on 08/03/2019 for the symptoms described in the history of present illness. She was evaluated in the context of the global COVID-19 pandemic, which necessitated consideration that the patient might be at risk for infection with the SARS-CoV-2 virus that causes COVID-19. Institutional protocols and algorithms that pertain to the evaluation of patients at risk for COVID-19 are in a state of rapid change based on information released by regulatory bodies  including the CDC and federal and state organizations. These policies and algorithms were followed during the patient's care in the ED.   53 year old female who presents with dysuria and suprapubic abdominal pain after finishing antibiotics for UTI. Differential diagnosis includes, but is not limited to, ovarian cyst, ovarian torsion, acute appendicitis, diverticulitis, urinary tract infection/pyelonephritis, endometriosis, bowel obstruction, colitis, renal colic, gastroenteritis, hernia, fibroids, endometriosis, pregnancy related pain including ectopic pregnancy, etc.  Patient is afebrile with normal white count.  Given her complaints of diaphoresis and cough, will check COVID and troponin/EKG.  Initiate IV fluid resuscitation and proceed to CT scan abdomen/pelvis to evaluate for intra-abdominal etiology of patient's symptoms.   Clinical Course as of Aug 03 451  Sun Aug 03, 2019  0445 Patient resting in NAD. Updated her of CT result. Will discharge home on Lactulose to use as needed for bowel movements. Cover with Azithromycin for possible atypical lung infection. Strict return precautions given. Patient verbalizes understanding and agrees with plan of care.   [JS]    Clinical Course User Index [JS] Paulette Blanch, MD     ____________________________________________   FINAL CLINICAL IMPRESSION(S) / ED DIAGNOSES  Final diagnoses:  Constipation, unspecified constipation type  Dysuria  Lower abdominal pain  Bronchitis     ED Discharge Orders         Ordered    azithromycin (ZITHROMAX Z-PAK) 250 MG tablet     08/03/19 0452    lactulose ([place]) 10 GM/15ML solution  Daily PRN     08/03/19 0452           Note:  This document was prepared using Dragon voice recognition software and may include unintentional dictation errors.   Paulette Blanch, MD 08/03/19 [phone]

## 2019-08-03 NOTE — ED Notes (Signed)
Report from annie, rn.  

## 2019-08-03 NOTE — ED Notes (Signed)
Pt assisted up to commode to void and then assisted back to bed. Pt is not sure how she will get back into her apartment, she does not have her keys.

## 2019-08-03 NOTE — ED Notes (Signed)
Pt sleeping. Call bell at right side. Bed in low locked position. Side rails up.

## 2019-08-03 NOTE — Discharge Instructions (Addendum)
Take antibiotic as prescribed (ZPack). You may take Lactulose as needed for bowel movements. Return to the ER for worsening symptoms, persistent vomiting, difficulty breathing or other concerns.

## 2019-08-10 ENCOUNTER — Emergency Department
Admission: EM | Admit: 2019-08-10 | Discharge: 2019-08-11 | Disposition: A | Discharge status: Home / Self Care | Payer: Medicare HMO | Attending: Emergency Medicine | Admitting: Emergency Medicine

## 2019-08-10 ENCOUNTER — Other Ambulatory Visit: Payer: Self-pay

## 2019-08-10 ENCOUNTER — Encounter: Payer: Self-pay | Admitting: Emergency Medicine

## 2019-08-10 DIAGNOSIS — I10 Essential (primary) hypertension: Secondary | ICD-10-CM | POA: Diagnosis not present

## 2019-08-10 DIAGNOSIS — R110 Nausea: Secondary | ICD-10-CM

## 2019-08-10 DIAGNOSIS — R197 Diarrhea, unspecified: Secondary | ICD-10-CM | POA: Diagnosis not present

## 2019-08-10 DIAGNOSIS — Z7984 Long term (current) use of oral hypoglycemic drugs: Secondary | ICD-10-CM | POA: Insufficient documentation

## 2019-08-10 DIAGNOSIS — Z79899 Other long term (current) drug therapy: Secondary | ICD-10-CM | POA: Diagnosis not present

## 2019-08-10 DIAGNOSIS — R112 Nausea with vomiting, unspecified: Secondary | ICD-10-CM | POA: Diagnosis not present

## 2019-08-10 DIAGNOSIS — E119 Type 2 diabetes mellitus without complications: Secondary | ICD-10-CM | POA: Diagnosis not present

## 2019-08-10 DIAGNOSIS — Z7901 Long term (current) use of anticoagulants: Secondary | ICD-10-CM | POA: Diagnosis not present

## 2019-08-10 LAB — COMPREHENSIVE METABOLIC PANEL
ALT: 16 U/L (ref 0–44)
AST: 18 U/L (ref 15–41)
Albumin: 4.5 g/dL (ref 3.5–5.0)
Alkaline Phosphatase: 84 U/L (ref 38–126)
Anion gap: 15 (ref 5–15)
BUN: 14 mg/dL (ref 6–20)
CO2: 17 mmol/L — ABNORMAL LOW (ref 22–32)
Calcium: 9.5 mg/dL (ref 8.9–10.3)
Chloride: 107 mmol/L (ref 98–111)
Creatinine, Ser: 0.59 mg/dL (ref 0.44–1.00)
GFR calc Af Amer: >60 mL/min (ref >60)
GFR calc non Af Amer: >60 mL/min (ref >60)
Glucose, Bld: 194 mg/dL — ABNORMAL HIGH (ref 70–99)
Potassium: 3.8 mmol/L (ref 3.5–5.1)
Sodium: 139 mmol/L (ref 135–145)
Total Bilirubin: 0.2 mg/dL — ABNORMAL LOW (ref 0.3–1.2)
Total Protein: 7.6 g/dL (ref 6.5–8.1)

## 2019-08-10 LAB — URINALYSIS, COMPLETE (UACMP) WITH MICROSCOPIC
Bacteria, UA: NONE SEEN
Bilirubin Urine: NEGATIVE
Glucose, UA: NEGATIVE mg/dL
Ketones, ur: NEGATIVE mg/dL
Leukocytes,Ua: NEGATIVE
Nitrite: NEGATIVE
Protein, ur: 100 mg/dL — AB
Specific Gravity, Urine: 1.02 (ref 1.005–1.030)
pH: 5 (ref 5.0–8.0)

## 2019-08-10 LAB — CBC
HCT: 38.3 % (ref 36.0–46.0)
Hemoglobin: 12.4 g/dL (ref 12.0–15.0)
MCH: 29.2 pg (ref 26.0–34.0)
MCHC: 32.4 g/dL (ref 30.0–36.0)
MCV: 90.3 fL (ref 80.0–100.0)
Platelets: 330 10*3/uL (ref 150–400)
RBC: 4.24 MIL/uL (ref 3.87–5.11)
RDW: 13.7 % (ref 11.5–15.5)
WBC: 6.4 10*3/uL (ref 4.0–10.5)
nRBC: 0 % (ref 0.0–0.2)

## 2019-08-10 LAB — LIPASE, BLOOD: Lipase: 18 U/L (ref 11–51)

## 2019-08-10 MED ORDER — DICYCLOMINE HCL 10 MG PO CAPS
20.0000 mg | ORAL_CAPSULE | Freq: Once | ORAL | Status: AC
  Administered 2019-08-10: 20 mg via ORAL
  Filled 2019-08-10: qty 2

## 2019-08-10 MED ORDER — BENZONATATE 100 MG PO CAPS: 100.0000 mg | ORAL_CAPSULE | Freq: Four times a day (QID) | ORAL | 0 refills | Status: AC | PRN

## 2019-08-10 MED ORDER — ONDANSETRON HCL 4 MG PO TABS: 4.0000 mg | ORAL_TABLET | Freq: Three times a day (TID) | ORAL | 0 refills | Status: DC | PRN

## 2019-08-10 NOTE — ED Triage Notes (Signed)
Patient with complaint of diarrhea, nausea and headache times one week.

## 2019-08-10 NOTE — ED Provider Notes (Signed)
[facility] Emergency Department Provider Note   ____________________________________________   I have reviewed the triage vital signs and the nursing notes.   HISTORY  Chief Complaint Nausea and Diarrhea   History limited by: Not Limited   HPI Hannah Clarke is a 53 y.o. female who presents to the emergency department today with primary concerns for nausea and vomiting.  Patient states that she has felt unwell since her recent visit to the emergency department.  She states she was told she had constipation at that time.  Since then she is continued to feel sick.  She has had abdominal discomfort as well as nausea vomiting and some diarrhea.  Patient denies any fevers.  Patient denies any bloody emesis or diarrhea.  Records reviewed. Per medical record review patient has a history of ER visit roughly one week ago for suprapubic pain, fatigue, dysuria. Work up included CT abd/pel which showed large amount of stool but no other significant finding.   Past Medical History:  Diagnosis Date  . Anxiety   . Chronic headaches   . Depression   . Dermatophytosis of nail   . Diabetes mellitus without complication ([place])   . GERD (gastroesophageal reflux disease)   . High cholesterol   . Hypertension   . Mental developmental delay   . Onychia of toe   . Panic attacks   . Renal insufficiency     Patient Active Problem List   Diagnosis Date Noted  . Intractable nausea and vomiting 08/01/2018  . Unstable angina ([place]) 08/17/2017    Past Surgical History:  Procedure Laterality Date  . COLONOSCOPY WITH PROPOFOL N/A 10/09/2017   Procedure: COLONOSCOPY WITH PROPOFOL;  Surgeon: Toledo, Benay Pike, MD;  Location: [facility] ENDOSCOPY;  Service: Gastroenterology;  Laterality: N/A;  . COLONOSCOPY WITH PROPOFOL N/A 02/06/2018   Procedure: COLONOSCOPY WITH PROPOFOL;  Surgeon: Toledo, Benay Pike, MD;  Location: [facility] ENDOSCOPY;  Service: Gastroenterology;  Laterality: N/A;  .  ESOPHAGOGASTRODUODENOSCOPY (EGD) WITH PROPOFOL N/A 10/09/2017   Procedure: ESOPHAGOGASTRODUODENOSCOPY (EGD) WITH PROPOFOL;  Surgeon: Toledo, Benay Pike, MD;  Location: [facility] ENDOSCOPY;  Service: Gastroenterology;  Laterality: N/A;  . ESOPHAGOGASTRODUODENOSCOPY (EGD) WITH PROPOFOL N/A 07/31/2018   Procedure: ESOPHAGOGASTRODUODENOSCOPY (EGD) WITH PROPOFOL;  Surgeon: Toledo, Benay Pike, MD;  Location: [facility] ENDOSCOPY;  Service: Gastroenterology;  Laterality: N/A;  . HAND SURGERY    . KIDNEY STONE SURGERY      Prior to Admission medications   Medication Sig Start Date End Date Taking? Authorizing Provider  acetaminophen (TYLENOL) 325 MG tablet Take 325 mg by mouth every 6 (six) hours as needed.    [provider]  albuterol (PROVENTIL HFA;VENTOLIN HFA) 108 (90 BASE) MCG/ACT inhaler Inhale 2 puffs into the lungs every 6 (six) hours as needed for wheezing or shortness of breath. 11/07/15   Orbie Pyo, MD  amLODipine (NORVASC) 5 MG tablet Take 5 mg by mouth daily.    [provider]  azithromycin (ZITHROMAX Z-PAK) 250 MG tablet Take as directed 08/03/19   Paulette Blanch, MD  cetirizine (ZYRTEC) 10 MG tablet Take 10 mg by mouth daily.    [provider]  cholecalciferol (VITAMIN D) 1000 units tablet Take 1,000 Units by mouth daily.    [provider]  citalopram (CELEXA) 20 MG tablet Take 20 mg by mouth daily.    [provider]  clopidogrel (PLAVIX) 75 MG tablet Take 75 mg by mouth daily.    [provider]  dexlansoprazole (DEXILANT) 60 MG capsule  Take 60 mg by mouth daily.    [provider]  lactulose (CHRONULAC) 10 GM/15ML solution Take 30 mLs (20 g total) by mouth daily as needed for mild constipation. 08/03/19   Paulette Blanch, MD  LORazepam (ATIVAN) 1 MG tablet Take 1 tablet (1 mg total) by mouth every 8 (eight) hours as needed for anxiety. 02/03/16   Paulette Blanch, MD  medroxyPROGESTERone (DEPO-PROVERA) 150 MG/ML injection Inject 150  mg into the muscle every 3 (three) months.    [provider]  metFORMIN (GLUCOPHAGE) 500 MG tablet Take 500 mg by mouth 2 (two) times daily with a meal.    [provider]  metoCLOPramide (REGLAN) 10 MG tablet Take 1 tablet (10 mg total) by mouth 3 (three) times daily before meals. 08/04/18 10/03/18  Gladstone Lighter, MD  ondansetron (ZOFRAN ODT) 4 MG disintegrating tablet Take 1 tablet (4 mg total) by mouth every 8 (eight) hours as needed for nausea or vomiting. 08/01/18   Harvest Dark, MD  polyethylene glycol (MIRALAX / GLYCOLAX) packet Take 17 g by mouth every other day.    [provider]  potassium chloride SA (K-DUR,KLOR-CON) 20 MEQ tablet Take 20 mEq by mouth daily.    [provider]  sennosides-docusate sodium (SENOKOT-S) 8.6-50 MG tablet Take 1 tablet by mouth daily.    [provider]  simvastatin (ZOCOR) 20 MG tablet Take 20 mg by mouth daily.    [provider]  sucralfate (CARAFATE) 1 g tablet Take 1 g by mouth 4 (four) times daily -  with meals and at bedtime.    [provider]    Allergies Patient has no known allergies.  Family History  Problem Relation Age of Onset  . Breast cancer Mother 27       pre pt.     Social History Social History   Tobacco Use  . Smoking status: Never Smoker  . Smokeless tobacco: Never Used  Substance Use Topics  . Alcohol use: Yes    Comment: OCCASSIONALLY  . Drug use: No    Review of Systems Constitutional: No fever/chills Eyes: No visual changes. ENT: No sore throat. Cardiovascular: Denies chest pain. Respiratory: Denies shortness of breath. Gastrointestinal: Positive for abdominal pain, nausea and diarrhea Genitourinary: Positive for dysuria. Musculoskeletal: Negative for back pain. Skin: Negative for rash. Neurological: Negative for headaches, focal weakness or numbness.  ____________________________________________   PHYSICAL EXAM:  VITAL SIGNS: ED  Triage Vitals  Enc Vitals Group     BP 08/10/19 1950 (!) 143/104     Pulse Rate 08/10/19 1950 97     Resp 08/10/19 1950 18     Temp 08/10/19 1950 98.9 F (37.2 C)     Temp Source 08/10/19 1950 Oral     SpO2 08/10/19 1950 95 %     Weight 08/10/19 1951 173 lb (78.5 kg)     Height 08/10/19 1951 5\' 2"  (1.575 m)     Head Circumference --      Peak Flow --      Pain Score 08/10/19 1950 10   Constitutional: Alert and oriented.  Eyes: Conjunctivae are normal.  ENT      Head: Normocephalic and atraumatic.      Nose: No congestion/rhinnorhea.      Mouth/Throat: Mucous membranes are moist.      Neck: No stridor. Hematological/Lymphatic/Immunilogical: No cervical lymphadenopathy. Cardiovascular: Normal rate, regular rhythm.  No murmurs, rubs, or gallops.  Respiratory: Normal respiratory effort without tachypnea nor retractions.  Breath sounds are clear and equal bilaterally. No wheezes/rales/rhonchi. Gastrointestinal: Soft and non tender. No rebound. No guarding.  Genitourinary: Deferred Musculoskeletal: Normal range of motion in all extremities. No lower extremity edema. Neurologic:  Normal speech and language. No gross focal neurologic deficits are appreciated.  Skin:  Skin is warm, dry and intact. No rash noted. Psychiatric: Mood and affect are normal. Speech and behavior are normal. Patient exhibits appropriate insight and judgment.  ____________________________________________    LABS (pertinent positives/negatives)  Lipase 18 CMP wnl except co2 17, glu 194 CBC wbc 6.4, hgb 12.4, plt 330 UA clear, moderate hgb dipstick, protein 100, rbc 6-10, wbc 0-5 ____________________________________________   EKG  None  ____________________________________________    RADIOLOGY  None  ____________________________________________   PROCEDURES  Procedures  ____________________________________________   INITIAL IMPRESSION / ASSESSMENT AND PLAN / ED COURSE  Pertinent labs &  imaging results that were available during my care of the patient were reviewed by me and considered in my medical decision making (see chart for details).   Patient presented to the emergency department today with primary concerns for continued abdominal pain, nausea and diarrhea.  On exam patient's abdomen is benign.  Patient without leukocytosis but here.  Had CT scan performed roughly 1 week ago of her abdomen which did not show any concerning findings.  Given lack of leukocytosis and fever here doubt patient's developed significant intra-abdominal infection.  Given diarrhea doubt obstruction.  Patient's urine was rechecked today without findings concerning for infection.  Discussed with patient portance of follow-up with primary care.   ____________________________________________   FINAL CLINICAL IMPRESSION(S) / ED DIAGNOSES  Final diagnoses:  Nausea  Diarrhea, unspecified type     Note: This dictation was prepared with Dragon dictation. Any transcriptional errors that result from this process are unintentional     Nance Pear, MD 08/11/19 1714

## 2019-08-10 NOTE — Discharge Instructions (Signed)
Please seek medical attention for any high fevers, chest pain, shortness of breath, change in behavior, persistent vomiting, bloody stool or any other new or concerning symptoms.  

## 2019-08-27 DIAGNOSIS — F411 Generalized anxiety disorder: Secondary | ICD-10-CM | POA: Diagnosis not present

## 2019-08-27 DIAGNOSIS — D519 Vitamin B12 deficiency anemia, unspecified: Secondary | ICD-10-CM | POA: Diagnosis not present

## 2019-08-27 DIAGNOSIS — F331 Major depressive disorder, recurrent, moderate: Secondary | ICD-10-CM | POA: Diagnosis not present

## 2019-08-27 DIAGNOSIS — Z23 Encounter for immunization: Secondary | ICD-10-CM | POA: Diagnosis not present

## 2019-08-27 DIAGNOSIS — E538 Deficiency of other specified B group vitamins: Secondary | ICD-10-CM | POA: Diagnosis not present

## 2019-08-27 DIAGNOSIS — F79 Unspecified intellectual disabilities: Secondary | ICD-10-CM | POA: Diagnosis not present

## 2019-08-27 DIAGNOSIS — I10 Essential (primary) hypertension: Secondary | ICD-10-CM | POA: Diagnosis not present

## 2019-08-27 DIAGNOSIS — E785 Hyperlipidemia, unspecified: Secondary | ICD-10-CM | POA: Diagnosis not present

## 2019-08-27 DIAGNOSIS — J309 Allergic rhinitis, unspecified: Secondary | ICD-10-CM | POA: Diagnosis not present

## 2019-08-27 DIAGNOSIS — E559 Vitamin D deficiency, unspecified: Secondary | ICD-10-CM | POA: Diagnosis not present

## 2019-08-27 DIAGNOSIS — F339 Major depressive disorder, recurrent, unspecified: Secondary | ICD-10-CM | POA: Diagnosis not present

## 2019-08-27 DIAGNOSIS — F410 Panic disorder [episodic paroxysmal anxiety] without agoraphobia: Secondary | ICD-10-CM | POA: Diagnosis not present

## 2019-08-27 DIAGNOSIS — R112 Nausea with vomiting, unspecified: Secondary | ICD-10-CM | POA: Diagnosis not present

## 2019-08-27 DIAGNOSIS — E119 Type 2 diabetes mellitus without complications: Secondary | ICD-10-CM | POA: Diagnosis not present

## 2019-08-28 DIAGNOSIS — K219 Gastro-esophageal reflux disease without esophagitis: Secondary | ICD-10-CM | POA: Diagnosis not present

## 2019-08-28 DIAGNOSIS — E785 Hyperlipidemia, unspecified: Secondary | ICD-10-CM | POA: Diagnosis not present

## 2019-08-28 DIAGNOSIS — I10 Essential (primary) hypertension: Secondary | ICD-10-CM | POA: Diagnosis not present

## 2019-08-28 DIAGNOSIS — R0602 Shortness of breath: Secondary | ICD-10-CM | POA: Diagnosis not present

## 2019-09-24 DIAGNOSIS — R110 Nausea: Secondary | ICD-10-CM | POA: Diagnosis not present

## 2019-09-24 DIAGNOSIS — R140 Abdominal distension (gaseous): Secondary | ICD-10-CM | POA: Diagnosis not present

## 2019-09-24 DIAGNOSIS — K219 Gastro-esophageal reflux disease without esophagitis: Secondary | ICD-10-CM | POA: Diagnosis not present

## 2019-09-24 DIAGNOSIS — K5909 Other constipation: Secondary | ICD-10-CM | POA: Diagnosis not present

## 2019-09-24 DIAGNOSIS — R4189 Other symptoms and signs involving cognitive functions and awareness: Secondary | ICD-10-CM | POA: Diagnosis not present

## 2019-09-24 DIAGNOSIS — K581 Irritable bowel syndrome with constipation: Secondary | ICD-10-CM | POA: Diagnosis not present

## 2019-09-24 DIAGNOSIS — K5900 Constipation, unspecified: Secondary | ICD-10-CM | POA: Diagnosis not present

## 2019-10-01 DIAGNOSIS — E538 Deficiency of other specified B group vitamins: Secondary | ICD-10-CM | POA: Diagnosis not present

## 2019-10-02 ENCOUNTER — Ambulatory Visit
Admission: RE | Admit: 2019-10-02 | Discharge: 2019-10-02 | Disposition: A | Discharge status: Home / Self Care | Payer: Medicare HMO | Source: Ambulatory Visit | Attending: Nurse Practitioner | Admitting: Nurse Practitioner

## 2019-10-02 DIAGNOSIS — Z1231 Encounter for screening mammogram for malignant neoplasm of breast: Secondary | ICD-10-CM | POA: Diagnosis not present

## 2019-10-08 DIAGNOSIS — H4312 Vitreous hemorrhage, left eye: Secondary | ICD-10-CM | POA: Diagnosis not present

## 2019-10-15 ENCOUNTER — Other Ambulatory Visit: Payer: Self-pay | Admitting: Gastroenterology

## 2019-10-15 DIAGNOSIS — J4540 Moderate persistent asthma, uncomplicated: Secondary | ICD-10-CM | POA: Diagnosis not present

## 2019-10-15 DIAGNOSIS — R1310 Dysphagia, unspecified: Secondary | ICD-10-CM

## 2019-10-22 ENCOUNTER — Ambulatory Visit
Admission: RE | Admit: 2019-10-22 | Discharge: 2019-10-22 | Disposition: A | Discharge status: Home / Self Care | Payer: Medicare HMO | Source: Ambulatory Visit | Attending: Gastroenterology | Admitting: Gastroenterology

## 2019-10-22 ENCOUNTER — Other Ambulatory Visit: Payer: Self-pay

## 2019-10-22 DIAGNOSIS — R1310 Dysphagia, unspecified: Secondary | ICD-10-CM | POA: Diagnosis not present

## 2019-10-22 DIAGNOSIS — H4312 Vitreous hemorrhage, left eye: Secondary | ICD-10-CM | POA: Diagnosis not present

## 2019-10-22 DIAGNOSIS — K219 Gastro-esophageal reflux disease without esophagitis: Secondary | ICD-10-CM | POA: Diagnosis not present

## 2019-10-23 DIAGNOSIS — E119 Type 2 diabetes mellitus without complications: Secondary | ICD-10-CM | POA: Diagnosis not present

## 2019-10-23 DIAGNOSIS — R5383 Other fatigue: Secondary | ICD-10-CM | POA: Diagnosis not present

## 2019-10-23 DIAGNOSIS — E785 Hyperlipidemia, unspecified: Secondary | ICD-10-CM | POA: Diagnosis not present

## 2019-10-23 DIAGNOSIS — D519 Vitamin B12 deficiency anemia, unspecified: Secondary | ICD-10-CM | POA: Diagnosis not present

## 2019-10-23 DIAGNOSIS — E559 Vitamin D deficiency, unspecified: Secondary | ICD-10-CM | POA: Diagnosis not present

## 2019-10-23 DIAGNOSIS — I10 Essential (primary) hypertension: Secondary | ICD-10-CM | POA: Diagnosis not present

## 2019-10-29 DIAGNOSIS — R5383 Other fatigue: Secondary | ICD-10-CM | POA: Diagnosis not present

## 2019-10-29 DIAGNOSIS — E538 Deficiency of other specified B group vitamins: Secondary | ICD-10-CM | POA: Diagnosis not present

## 2019-10-29 DIAGNOSIS — I10 Essential (primary) hypertension: Secondary | ICD-10-CM | POA: Diagnosis not present

## 2019-10-29 DIAGNOSIS — E785 Hyperlipidemia, unspecified: Secondary | ICD-10-CM | POA: Diagnosis not present

## 2019-10-29 DIAGNOSIS — G4489 Other headache syndrome: Secondary | ICD-10-CM | POA: Diagnosis not present

## 2019-10-29 DIAGNOSIS — E119 Type 2 diabetes mellitus without complications: Secondary | ICD-10-CM | POA: Diagnosis not present

## 2019-10-29 DIAGNOSIS — Z308 Encounter for other contraceptive management: Secondary | ICD-10-CM | POA: Diagnosis not present

## 2019-10-29 DIAGNOSIS — J309 Allergic rhinitis, unspecified: Secondary | ICD-10-CM | POA: Diagnosis not present

## 2019-11-02 DIAGNOSIS — R112 Nausea with vomiting, unspecified: Secondary | ICD-10-CM | POA: Diagnosis not present

## 2019-11-02 DIAGNOSIS — Z20828 Contact with and (suspected) exposure to other viral communicable diseases: Secondary | ICD-10-CM | POA: Diagnosis not present

## 2019-11-13 DIAGNOSIS — Z20828 Contact with and (suspected) exposure to other viral communicable diseases: Secondary | ICD-10-CM | POA: Diagnosis not present

## 2019-11-18 DIAGNOSIS — M79675 Pain in left toe(s): Secondary | ICD-10-CM | POA: Diagnosis not present

## 2019-11-18 DIAGNOSIS — M79674 Pain in right toe(s): Secondary | ICD-10-CM | POA: Diagnosis not present

## 2019-11-18 DIAGNOSIS — B351 Tinea unguium: Secondary | ICD-10-CM | POA: Diagnosis not present

## 2019-11-20 DIAGNOSIS — H1045 Other chronic allergic conjunctivitis: Secondary | ICD-10-CM | POA: Diagnosis not present

## 2019-11-20 DIAGNOSIS — J4530 Mild persistent asthma, uncomplicated: Secondary | ICD-10-CM | POA: Diagnosis not present

## 2019-11-20 DIAGNOSIS — J300 Vasomotor rhinitis: Secondary | ICD-10-CM | POA: Diagnosis not present

## 2019-12-25 DIAGNOSIS — E538 Deficiency of other specified B group vitamins: Secondary | ICD-10-CM | POA: Diagnosis not present

## 2020-01-08 DIAGNOSIS — R319 Hematuria, unspecified: Secondary | ICD-10-CM | POA: Diagnosis not present

## 2020-01-08 DIAGNOSIS — R809 Proteinuria, unspecified: Secondary | ICD-10-CM | POA: Diagnosis not present

## 2020-01-08 DIAGNOSIS — N200 Calculus of kidney: Secondary | ICD-10-CM | POA: Diagnosis not present

## 2020-01-08 DIAGNOSIS — N3001 Acute cystitis with hematuria: Secondary | ICD-10-CM | POA: Diagnosis not present

## 2020-01-08 DIAGNOSIS — I10 Essential (primary) hypertension: Secondary | ICD-10-CM | POA: Diagnosis not present

## 2020-01-21 DIAGNOSIS — E538 Deficiency of other specified B group vitamins: Secondary | ICD-10-CM | POA: Diagnosis not present

## 2020-01-21 DIAGNOSIS — R5383 Other fatigue: Secondary | ICD-10-CM | POA: Diagnosis not present

## 2020-01-21 DIAGNOSIS — E785 Hyperlipidemia, unspecified: Secondary | ICD-10-CM | POA: Diagnosis not present

## 2020-01-21 DIAGNOSIS — I10 Essential (primary) hypertension: Secondary | ICD-10-CM | POA: Diagnosis not present

## 2020-01-21 DIAGNOSIS — E119 Type 2 diabetes mellitus without complications: Secondary | ICD-10-CM | POA: Diagnosis not present

## 2020-01-26 DIAGNOSIS — Z01818 Encounter for other preprocedural examination: Secondary | ICD-10-CM | POA: Diagnosis not present

## 2020-01-28 DIAGNOSIS — J4540 Moderate persistent asthma, uncomplicated: Secondary | ICD-10-CM | POA: Diagnosis not present

## 2020-01-29 DIAGNOSIS — I10 Essential (primary) hypertension: Secondary | ICD-10-CM | POA: Diagnosis not present

## 2020-01-29 DIAGNOSIS — E785 Hyperlipidemia, unspecified: Secondary | ICD-10-CM | POA: Diagnosis not present

## 2020-01-29 DIAGNOSIS — G4489 Other headache syndrome: Secondary | ICD-10-CM | POA: Diagnosis not present

## 2020-01-29 DIAGNOSIS — E119 Type 2 diabetes mellitus without complications: Secondary | ICD-10-CM | POA: Diagnosis not present

## 2020-01-29 DIAGNOSIS — D519 Vitamin B12 deficiency anemia, unspecified: Secondary | ICD-10-CM | POA: Diagnosis not present

## 2020-02-17 DIAGNOSIS — G44229 Chronic tension-type headache, not intractable: Secondary | ICD-10-CM | POA: Diagnosis not present

## 2020-02-17 DIAGNOSIS — R110 Nausea: Secondary | ICD-10-CM | POA: Diagnosis not present

## 2020-02-17 DIAGNOSIS — R140 Abdominal distension (gaseous): Secondary | ICD-10-CM | POA: Diagnosis not present

## 2020-02-17 DIAGNOSIS — K219 Gastro-esophageal reflux disease without esophagitis: Secondary | ICD-10-CM | POA: Diagnosis not present

## 2020-02-17 DIAGNOSIS — R4189 Other symptoms and signs involving cognitive functions and awareness: Secondary | ICD-10-CM | POA: Diagnosis not present

## 2020-02-17 DIAGNOSIS — K581 Irritable bowel syndrome with constipation: Secondary | ICD-10-CM | POA: Diagnosis not present

## 2020-02-24 DIAGNOSIS — H53149 Visual discomfort, unspecified: Secondary | ICD-10-CM | POA: Diagnosis not present

## 2020-02-24 DIAGNOSIS — R519 Headache, unspecified: Secondary | ICD-10-CM | POA: Diagnosis not present

## 2020-02-24 DIAGNOSIS — G479 Sleep disorder, unspecified: Secondary | ICD-10-CM | POA: Diagnosis not present

## 2020-02-26 DIAGNOSIS — K219 Gastro-esophageal reflux disease without esophagitis: Secondary | ICD-10-CM | POA: Diagnosis not present

## 2020-02-26 DIAGNOSIS — E669 Obesity, unspecified: Secondary | ICD-10-CM | POA: Diagnosis not present

## 2020-02-26 DIAGNOSIS — I10 Essential (primary) hypertension: Secondary | ICD-10-CM | POA: Diagnosis not present

## 2020-02-26 DIAGNOSIS — D519 Vitamin B12 deficiency anemia, unspecified: Secondary | ICD-10-CM | POA: Diagnosis not present

## 2020-02-26 DIAGNOSIS — E785 Hyperlipidemia, unspecified: Secondary | ICD-10-CM | POA: Diagnosis not present

## 2020-02-27 DIAGNOSIS — R519 Headache, unspecified: Secondary | ICD-10-CM | POA: Insufficient documentation

## 2020-02-27 DIAGNOSIS — G479 Sleep disorder, unspecified: Secondary | ICD-10-CM | POA: Insufficient documentation

## 2020-02-27 DIAGNOSIS — H53149 Visual discomfort, unspecified: Secondary | ICD-10-CM | POA: Insufficient documentation

## 2020-03-03 DIAGNOSIS — H4312 Vitreous hemorrhage, left eye: Secondary | ICD-10-CM | POA: Diagnosis not present

## 2020-03-10 DIAGNOSIS — F79 Unspecified intellectual disabilities: Secondary | ICD-10-CM | POA: Diagnosis not present

## 2020-03-10 DIAGNOSIS — F410 Panic disorder [episodic paroxysmal anxiety] without agoraphobia: Secondary | ICD-10-CM | POA: Diagnosis not present

## 2020-03-10 DIAGNOSIS — F339 Major depressive disorder, recurrent, unspecified: Secondary | ICD-10-CM | POA: Diagnosis not present

## 2020-03-12 DIAGNOSIS — R4183 Borderline intellectual functioning: Secondary | ICD-10-CM | POA: Diagnosis not present

## 2020-03-12 DIAGNOSIS — F79 Unspecified intellectual disabilities: Secondary | ICD-10-CM | POA: Diagnosis not present

## 2020-03-12 DIAGNOSIS — Z79899 Other long term (current) drug therapy: Secondary | ICD-10-CM | POA: Diagnosis not present

## 2020-03-12 DIAGNOSIS — F3341 Major depressive disorder, recurrent, in partial remission: Secondary | ICD-10-CM | POA: Diagnosis not present

## 2020-03-12 DIAGNOSIS — F410 Panic disorder [episodic paroxysmal anxiety] without agoraphobia: Secondary | ICD-10-CM | POA: Diagnosis not present

## 2020-03-12 DIAGNOSIS — F339 Major depressive disorder, recurrent, unspecified: Secondary | ICD-10-CM | POA: Diagnosis not present

## 2020-03-12 DIAGNOSIS — Z634 Disappearance and death of family member: Secondary | ICD-10-CM | POA: Diagnosis not present

## 2020-03-12 DIAGNOSIS — F331 Major depressive disorder, recurrent, moderate: Secondary | ICD-10-CM | POA: Diagnosis not present

## 2020-03-16 DIAGNOSIS — N390 Urinary tract infection, site not specified: Secondary | ICD-10-CM | POA: Diagnosis not present

## 2020-03-16 DIAGNOSIS — R42 Dizziness and giddiness: Secondary | ICD-10-CM | POA: Diagnosis not present

## 2020-03-16 DIAGNOSIS — R109 Unspecified abdominal pain: Secondary | ICD-10-CM | POA: Diagnosis not present

## 2020-03-16 DIAGNOSIS — R319 Hematuria, unspecified: Secondary | ICD-10-CM | POA: Diagnosis not present

## 2020-03-23 DIAGNOSIS — R519 Headache, unspecified: Secondary | ICD-10-CM | POA: Diagnosis not present

## 2020-03-23 DIAGNOSIS — H53149 Visual discomfort, unspecified: Secondary | ICD-10-CM | POA: Diagnosis not present

## 2020-03-23 DIAGNOSIS — G479 Sleep disorder, unspecified: Secondary | ICD-10-CM | POA: Diagnosis not present

## 2020-03-30 DIAGNOSIS — I10 Essential (primary) hypertension: Secondary | ICD-10-CM | POA: Diagnosis not present

## 2020-03-30 DIAGNOSIS — N200 Calculus of kidney: Secondary | ICD-10-CM | POA: Diagnosis not present

## 2020-03-30 DIAGNOSIS — D519 Vitamin B12 deficiency anemia, unspecified: Secondary | ICD-10-CM | POA: Diagnosis not present

## 2020-03-30 DIAGNOSIS — E785 Hyperlipidemia, unspecified: Secondary | ICD-10-CM | POA: Diagnosis not present

## 2020-03-30 DIAGNOSIS — G4489 Other headache syndrome: Secondary | ICD-10-CM | POA: Diagnosis not present

## 2020-04-20 NOTE — Progress Notes (Signed)
04/21/20 5:49 PM   Hannah Clarke 1966-05-21 [id number]  Referring provider: Danelle Berry, NP [address] [place],  [place] [address] Chief Complaint  Patient presents with  . Abdominal Pain    HPI: Hannah Clarke is a 54 y.o. female who presents for the evaluation and management of none-obstructing calculi in the left kidney, left lower abdominal pain, and left flank pain.   -Patient with chronic left back pain, pain above the left iliac crest and chronic abdominal pain -Seen in ED August and September 2020 for abdominal pain -CT scans performed each visit showed nonobstructing left renal calculus -Urinalysis at those visits did show microhematuria at 6-10 WBCs -Most recently seen at [facility] -Urine culture positive E. coli 03/16/2020 -CT performed at [facility] in April/2021 showed none-obstructing calculi in the left kidney with cortical scarring, images are not currently available for review  PMH: Past Medical History:  Diagnosis Date  . Anxiety   . Chronic headaches   . Depression   . Dermatophytosis of nail   . Diabetes mellitus without complication ([place])   . GERD (gastroesophageal reflux disease)   . High cholesterol   . Hypertension   . Mental developmental delay   . Onychia of toe   . Panic attacks   . Renal insufficiency     Surgical History: Past Surgical History:  Procedure Laterality Date  . COLONOSCOPY WITH PROPOFOL N/A 10/09/2017   Procedure: COLONOSCOPY WITH PROPOFOL;  Surgeon: Toledo, Benay Pike, MD;  Location: [facility] ENDOSCOPY;  Service: Gastroenterology;  Laterality: N/A;  . COLONOSCOPY WITH PROPOFOL N/A 02/06/2018   Procedure: COLONOSCOPY WITH PROPOFOL;  Surgeon: Toledo, Benay Pike, MD;  Location: [facility] ENDOSCOPY;  Service: Gastroenterology;  Laterality: N/A;  . ESOPHAGOGASTRODUODENOSCOPY (EGD) WITH PROPOFOL N/A 10/09/2017   Procedure: ESOPHAGOGASTRODUODENOSCOPY (EGD) WITH PROPOFOL;  Surgeon: Toledo, Benay Pike, MD;  Location: [facility]  ENDOSCOPY;  Service: Gastroenterology;  Laterality: N/A;  . ESOPHAGOGASTRODUODENOSCOPY (EGD) WITH PROPOFOL N/A 07/31/2018   Procedure: ESOPHAGOGASTRODUODENOSCOPY (EGD) WITH PROPOFOL;  Surgeon: Toledo, Benay Pike, MD;  Location: [facility] ENDOSCOPY;  Service: Gastroenterology;  Laterality: N/A;  . HAND SURGERY    . KIDNEY STONE SURGERY      Home Medications:  Allergies as of 04/21/2020   No Known Allergies     Medication List       Accurate as of Apr 21, 2020  5:49 PM. If you have any questions, ask your nurse or doctor.        acetaminophen 325 MG tablet Commonly known as: TYLENOL Take 325 mg by mouth every 6 (six) hours as needed.   Advair Diskus 250-50 MCG/DOSE Aepb Generic drug: Fluticasone-Salmeterol advair diskus 250-50 mcg/dose aepb   albuterol 108 (90 Base) MCG/ACT inhaler Commonly known as: VENTOLIN HFA Inhale 2 puffs into the lungs every 6 (six) hours as needed for wheezing or shortness of breath.   amLODipine 5 MG tablet Commonly known as: NORVASC Take 5 mg by mouth daily.   azithromycin 250 MG tablet Commonly known as: Zithromax Z-Pak Take as directed   benzonatate 100 MG capsule Commonly known as: Tessalon Perles Take 1 capsule (100 mg total) by mouth every 6 (six) hours as needed for cough.   cetirizine 10 MG tablet Commonly known as: ZYRTEC Take 10 mg by mouth daily.   cholecalciferol 1000 units tablet Commonly known as: VITAMIN D Take 1,000 Units by mouth daily.   citalopram 20 MG tablet Commonly known as: CELEXA Take 20 mg by mouth daily.   clopidogrel 75  MG tablet Commonly known as: PLAVIX Take 75 mg by mouth daily.   Dexilant 60 MG capsule Generic drug: dexlansoprazole Take 60 mg by mouth daily.   lactulose 10 GM/15ML solution Commonly known as: CHRONULAC Take 30 mLs (20 g total) by mouth daily as needed for mild constipation.   LORazepam 1 MG tablet Commonly known as: ATIVAN Take 1 tablet (1 mg total) by mouth every 8 (eight) hours as  needed for anxiety.   medroxyPROGESTERone 150 MG/ML injection Commonly known as: DEPO-PROVERA Inject 150 mg into the muscle every 3 (three) months.   metFORMIN 500 MG tablet Commonly known as: GLUCOPHAGE Take 500 mg by mouth 2 (two) times daily with a meal.   metoCLOPramide 10 MG tablet Commonly known as: REGLAN Take 1 tablet (10 mg total) by mouth 3 (three) times daily before meals.   ondansetron 4 MG disintegrating tablet Commonly known as: Zofran ODT Take 1 tablet (4 mg total) by mouth every 8 (eight) hours as needed for nausea or vomiting.   ondansetron 4 MG tablet Commonly known as: Zofran Take 1 tablet (4 mg total) by mouth every 8 (eight) hours as needed.   polyethylene glycol 17 g packet Commonly known as: MIRALAX / GLYCOLAX Take 17 g by mouth every other day.   potassium chloride SA 20 MEQ tablet Commonly known as: KLOR-CON Take 20 mEq by mouth daily.   sennosides-docusate sodium 8.6-50 MG tablet Commonly known as: SENOKOT-S Take 1 tablet by mouth daily.   simvastatin 20 MG tablet Commonly known as: ZOCOR Take 20 mg by mouth daily.   sucralfate 1 g tablet Commonly known as: CARAFATE Take 1 g by mouth 4 (four) times daily -  with meals and at bedtime.       Allergies: No Known Allergies  Family History: Family History  Problem Relation Age of Onset  . Breast cancer Mother 43       pre pt.     Social History:  reports that she has never smoked. She has never used smokeless tobacco. She reports current alcohol use. She reports that she does not use drugs.   Physical Exam: BP 116/79   Pulse 94   Ht 5' (1.524 m)   Wt 164 lb (74.4 kg)   BMI 32.03 kg/m   Constitutional:  Alert, No acute distress. HEENT: [place] AT, moist mucus membranes.  Trachea midline, no masses. Cardiovascular: No clubbing, cyanosis, or edema. Respiratory: Normal respiratory effort, no increased work of breathing. Skin: No rashes, bruises or suspicious lesions.  Imaging: CT  scans from 2020 were personally reviewed.  She has left cortical scar with parenchymal loss with a nonobstructing stone present   Assessment & Plan:    1.  Abdominal/flank pain -  Discussed with patient and caregiver how it is unlikely that none-obstructing renal calculus would be causing her pain -Will request a CD of her CT scan so that I can review it personally. Will contact Merlene Morse once I am able to review it. -If CT has not significant change would not recommend starting treatment  2.  Nephrolithiasis -As above  3.  Microhematuria -Intermediate risk microhematuria; she has had upper tract imaging and recommend scheduling cystoscopy.  [facility] [address] [place], [place] [address] [phone]  I, Joneen Boers Peace, am acting as a Education administrator for Dr. Nicki Reaper C. Riaz Onorato.  I have reviewed the above documentation for accuracy and completeness, and I agree with the above.   [name], MD

## 2020-04-21 ENCOUNTER — Other Ambulatory Visit: Payer: Self-pay

## 2020-04-21 ENCOUNTER — Encounter: Payer: Self-pay | Admitting: Urology

## 2020-04-21 ENCOUNTER — Ambulatory Visit (INDEPENDENT_AMBULATORY_CARE_PROVIDER_SITE_OTHER): Payer: Medicare HMO | Admitting: Urology

## 2020-04-21 VITALS — BP 116/79 | HR 94 | Ht 60.0 in | Wt 164.0 lb

## 2020-04-21 DIAGNOSIS — R3129 Other microscopic hematuria: Secondary | ICD-10-CM

## 2020-04-21 DIAGNOSIS — N200 Calculus of kidney: Secondary | ICD-10-CM

## 2020-04-21 DIAGNOSIS — R1032 Left lower quadrant pain: Secondary | ICD-10-CM

## 2020-04-22 ENCOUNTER — Encounter: Payer: Self-pay | Admitting: Urology

## 2020-04-22 ENCOUNTER — Telehealth: Payer: Self-pay | Admitting: Urology

## 2020-04-22 DIAGNOSIS — N200 Calculus of kidney: Secondary | ICD-10-CM | POA: Insufficient documentation

## 2020-04-22 DIAGNOSIS — R3129 Other microscopic hematuria: Secondary | ICD-10-CM | POA: Insufficient documentation

## 2020-04-22 DIAGNOSIS — R1032 Left lower quadrant pain: Secondary | ICD-10-CM | POA: Insufficient documentation

## 2020-04-22 NOTE — Telephone Encounter (Signed)
CD was requested but they said they would have to mail it so it will take a few days.   Hannah Clarke

## 2020-04-22 NOTE — Telephone Encounter (Signed)
-----   Message from [name], MD sent at 04/21/2020 10:52 AM EDT ----- Regarding: CT Is it possible to get a CD of patient's CT performed at [facility] to review?

## 2020-04-27 DIAGNOSIS — Z3042 Encounter for surveillance of injectable contraceptive: Secondary | ICD-10-CM | POA: Diagnosis not present

## 2020-04-27 DIAGNOSIS — D519 Vitamin B12 deficiency anemia, unspecified: Secondary | ICD-10-CM | POA: Diagnosis not present

## 2020-05-12 DIAGNOSIS — J209 Acute bronchitis, unspecified: Secondary | ICD-10-CM | POA: Diagnosis not present

## 2020-05-18 ENCOUNTER — Other Ambulatory Visit: Payer: Self-pay | Admitting: Gastroenterology

## 2020-05-18 DIAGNOSIS — R519 Headache, unspecified: Secondary | ICD-10-CM | POA: Diagnosis not present

## 2020-05-18 DIAGNOSIS — R1310 Dysphagia, unspecified: Secondary | ICD-10-CM | POA: Diagnosis not present

## 2020-05-18 DIAGNOSIS — K219 Gastro-esophageal reflux disease without esophagitis: Secondary | ICD-10-CM | POA: Diagnosis not present

## 2020-05-18 DIAGNOSIS — R4189 Other symptoms and signs involving cognitive functions and awareness: Secondary | ICD-10-CM | POA: Diagnosis not present

## 2020-05-18 DIAGNOSIS — K581 Irritable bowel syndrome with constipation: Secondary | ICD-10-CM | POA: Diagnosis not present

## 2020-05-18 DIAGNOSIS — R110 Nausea: Secondary | ICD-10-CM | POA: Diagnosis not present

## 2020-05-18 DIAGNOSIS — R1314 Dysphagia, pharyngoesophageal phase: Secondary | ICD-10-CM | POA: Diagnosis not present

## 2020-05-18 DIAGNOSIS — R140 Abdominal distension (gaseous): Secondary | ICD-10-CM | POA: Diagnosis not present

## 2020-05-25 DIAGNOSIS — R519 Headache, unspecified: Secondary | ICD-10-CM | POA: Diagnosis not present

## 2020-05-25 DIAGNOSIS — G479 Sleep disorder, unspecified: Secondary | ICD-10-CM | POA: Diagnosis not present

## 2020-05-25 DIAGNOSIS — H53149 Visual discomfort, unspecified: Secondary | ICD-10-CM | POA: Diagnosis not present

## 2020-06-08 DIAGNOSIS — E785 Hyperlipidemia, unspecified: Secondary | ICD-10-CM | POA: Diagnosis not present

## 2020-06-08 DIAGNOSIS — E538 Deficiency of other specified B group vitamins: Secondary | ICD-10-CM | POA: Diagnosis not present

## 2020-06-08 DIAGNOSIS — E119 Type 2 diabetes mellitus without complications: Secondary | ICD-10-CM | POA: Diagnosis not present

## 2020-06-08 DIAGNOSIS — E559 Vitamin D deficiency, unspecified: Secondary | ICD-10-CM | POA: Diagnosis not present

## 2020-06-08 DIAGNOSIS — R5383 Other fatigue: Secondary | ICD-10-CM | POA: Diagnosis not present

## 2020-06-08 DIAGNOSIS — D519 Vitamin B12 deficiency anemia, unspecified: Secondary | ICD-10-CM | POA: Diagnosis not present

## 2020-06-08 DIAGNOSIS — I10 Essential (primary) hypertension: Secondary | ICD-10-CM | POA: Diagnosis not present

## 2020-06-10 DIAGNOSIS — K219 Gastro-esophageal reflux disease without esophagitis: Secondary | ICD-10-CM | POA: Diagnosis not present

## 2020-06-10 DIAGNOSIS — D519 Vitamin B12 deficiency anemia, unspecified: Secondary | ICD-10-CM | POA: Diagnosis not present

## 2020-06-10 DIAGNOSIS — E785 Hyperlipidemia, unspecified: Secondary | ICD-10-CM | POA: Diagnosis not present

## 2020-06-10 DIAGNOSIS — R079 Chest pain, unspecified: Secondary | ICD-10-CM | POA: Diagnosis not present

## 2020-06-10 DIAGNOSIS — I10 Essential (primary) hypertension: Secondary | ICD-10-CM | POA: Diagnosis not present

## 2020-06-10 DIAGNOSIS — R0602 Shortness of breath: Secondary | ICD-10-CM | POA: Diagnosis not present

## 2020-06-16 ENCOUNTER — Other Ambulatory Visit: Payer: Self-pay

## 2020-06-16 ENCOUNTER — Ambulatory Visit
Admission: RE | Admit: 2020-06-16 | Discharge: 2020-06-16 | Disposition: A | Discharge status: Home / Self Care | Payer: Medicare HMO | Source: Ambulatory Visit | Attending: Gastroenterology | Admitting: Gastroenterology

## 2020-06-16 DIAGNOSIS — R1314 Dysphagia, pharyngoesophageal phase: Secondary | ICD-10-CM | POA: Diagnosis not present

## 2020-06-16 DIAGNOSIS — K449 Diaphragmatic hernia without obstruction or gangrene: Secondary | ICD-10-CM | POA: Diagnosis not present

## 2020-06-16 DIAGNOSIS — K219 Gastro-esophageal reflux disease without esophagitis: Secondary | ICD-10-CM | POA: Diagnosis not present

## 2020-06-16 DIAGNOSIS — R1310 Dysphagia, unspecified: Secondary | ICD-10-CM | POA: Diagnosis not present

## 2020-06-25 ENCOUNTER — Telehealth: Payer: Self-pay | Admitting: Urology

## 2020-06-25 NOTE — Telephone Encounter (Signed)
Patient was seen in May for nephrolithiasis however the CT was performed at an outside facility and was not available for review.  I have reviewed the CD that was sent and she has a nonobstructing stone which is unlikely causing any pain.  I would recommend monitoring with a KUB in 6 months.  If they desire to have the stone removed we can schedule ureteroscopy however it is unlikely this will resolve her pain  The contact in chart Is Pena with number listed [phone]

## 2020-06-30 ENCOUNTER — Other Ambulatory Visit: Payer: Self-pay | Admitting: *Deleted

## 2020-06-30 DIAGNOSIS — N200 Calculus of kidney: Secondary | ICD-10-CM

## 2020-06-30 NOTE — Telephone Encounter (Signed)
Notified caregiver  as instructed, patient pleased.

## 2020-07-07 DIAGNOSIS — E1121 Type 2 diabetes mellitus with diabetic nephropathy: Secondary | ICD-10-CM | POA: Insufficient documentation

## 2020-07-07 DIAGNOSIS — I10 Essential (primary) hypertension: Secondary | ICD-10-CM | POA: Insufficient documentation

## 2020-07-07 DIAGNOSIS — N200 Calculus of kidney: Secondary | ICD-10-CM | POA: Insufficient documentation

## 2020-07-07 DIAGNOSIS — R319 Hematuria, unspecified: Secondary | ICD-10-CM | POA: Insufficient documentation

## 2020-07-07 DIAGNOSIS — R809 Proteinuria, unspecified: Secondary | ICD-10-CM | POA: Insufficient documentation

## 2020-07-13 DIAGNOSIS — I10 Essential (primary) hypertension: Secondary | ICD-10-CM | POA: Diagnosis not present

## 2020-07-13 DIAGNOSIS — R8290 Unspecified abnormal findings in urine: Secondary | ICD-10-CM | POA: Diagnosis not present

## 2020-07-13 DIAGNOSIS — R809 Proteinuria, unspecified: Secondary | ICD-10-CM | POA: Diagnosis not present

## 2020-07-14 ENCOUNTER — Other Ambulatory Visit: Payer: Self-pay | Admitting: Nurse Practitioner

## 2020-07-14 DIAGNOSIS — Z1231 Encounter for screening mammogram for malignant neoplasm of breast: Secondary | ICD-10-CM

## 2020-07-14 DIAGNOSIS — E119 Type 2 diabetes mellitus without complications: Secondary | ICD-10-CM | POA: Diagnosis not present

## 2020-07-14 DIAGNOSIS — M25561 Pain in right knee: Secondary | ICD-10-CM | POA: Diagnosis not present

## 2020-07-14 DIAGNOSIS — Z3042 Encounter for surveillance of injectable contraceptive: Secondary | ICD-10-CM | POA: Diagnosis not present

## 2020-07-14 DIAGNOSIS — D519 Vitamin B12 deficiency anemia, unspecified: Secondary | ICD-10-CM | POA: Diagnosis not present

## 2020-07-14 DIAGNOSIS — I10 Essential (primary) hypertension: Secondary | ICD-10-CM | POA: Diagnosis not present

## 2020-07-14 DIAGNOSIS — E785 Hyperlipidemia, unspecified: Secondary | ICD-10-CM | POA: Diagnosis not present

## 2020-07-20 DIAGNOSIS — R519 Headache, unspecified: Secondary | ICD-10-CM | POA: Diagnosis not present

## 2020-07-20 DIAGNOSIS — G479 Sleep disorder, unspecified: Secondary | ICD-10-CM | POA: Diagnosis not present

## 2020-07-20 DIAGNOSIS — H53149 Visual discomfort, unspecified: Secondary | ICD-10-CM | POA: Diagnosis not present

## 2020-08-16 DIAGNOSIS — D519 Vitamin B12 deficiency anemia, unspecified: Secondary | ICD-10-CM | POA: Diagnosis not present

## 2020-08-16 DIAGNOSIS — Z23 Encounter for immunization: Secondary | ICD-10-CM | POA: Diagnosis not present

## 2020-08-18 ENCOUNTER — Other Ambulatory Visit: Payer: Self-pay | Admitting: Pulmonary Disease

## 2020-08-18 DIAGNOSIS — F79 Unspecified intellectual disabilities: Secondary | ICD-10-CM | POA: Diagnosis not present

## 2020-08-18 DIAGNOSIS — J209 Acute bronchitis, unspecified: Secondary | ICD-10-CM

## 2020-08-18 DIAGNOSIS — J4540 Moderate persistent asthma, uncomplicated: Secondary | ICD-10-CM | POA: Diagnosis not present

## 2020-08-18 DIAGNOSIS — F339 Major depressive disorder, recurrent, unspecified: Secondary | ICD-10-CM | POA: Diagnosis not present

## 2020-08-18 DIAGNOSIS — R053 Chronic cough: Secondary | ICD-10-CM

## 2020-08-18 DIAGNOSIS — R05 Cough: Secondary | ICD-10-CM | POA: Diagnosis not present

## 2020-08-18 DIAGNOSIS — Z01818 Encounter for other preprocedural examination: Secondary | ICD-10-CM | POA: Diagnosis not present

## 2020-08-24 DIAGNOSIS — R140 Abdominal distension (gaseous): Secondary | ICD-10-CM | POA: Diagnosis not present

## 2020-08-24 DIAGNOSIS — R4189 Other symptoms and signs involving cognitive functions and awareness: Secondary | ICD-10-CM | POA: Diagnosis not present

## 2020-08-24 DIAGNOSIS — M6208 Separation of muscle (nontraumatic), other site: Secondary | ICD-10-CM | POA: Diagnosis not present

## 2020-08-24 DIAGNOSIS — K219 Gastro-esophageal reflux disease without esophagitis: Secondary | ICD-10-CM | POA: Diagnosis not present

## 2020-08-24 DIAGNOSIS — R1314 Dysphagia, pharyngoesophageal phase: Secondary | ICD-10-CM | POA: Diagnosis not present

## 2020-08-24 DIAGNOSIS — K5641 Fecal impaction: Secondary | ICD-10-CM | POA: Diagnosis not present

## 2020-08-24 DIAGNOSIS — K581 Irritable bowel syndrome with constipation: Secondary | ICD-10-CM | POA: Diagnosis not present

## 2020-08-24 DIAGNOSIS — J4540 Moderate persistent asthma, uncomplicated: Secondary | ICD-10-CM | POA: Diagnosis not present

## 2020-09-07 ENCOUNTER — Other Ambulatory Visit: Payer: Self-pay

## 2020-09-07 ENCOUNTER — Ambulatory Visit
Admission: RE | Admit: 2020-09-07 | Discharge: 2020-09-07 | Disposition: A | Discharge status: Home / Self Care | Payer: Medicare HMO | Source: Ambulatory Visit | Attending: Pulmonary Disease | Admitting: Pulmonary Disease

## 2020-09-07 DIAGNOSIS — R053 Chronic cough: Secondary | ICD-10-CM | POA: Diagnosis not present

## 2020-09-07 DIAGNOSIS — J209 Acute bronchitis, unspecified: Secondary | ICD-10-CM

## 2020-09-07 DIAGNOSIS — R059 Cough, unspecified: Secondary | ICD-10-CM | POA: Diagnosis not present

## 2020-09-07 DIAGNOSIS — R0602 Shortness of breath: Secondary | ICD-10-CM | POA: Diagnosis not present

## 2020-09-14 DIAGNOSIS — E119 Type 2 diabetes mellitus without complications: Secondary | ICD-10-CM | POA: Diagnosis not present

## 2020-09-16 DIAGNOSIS — D519 Vitamin B12 deficiency anemia, unspecified: Secondary | ICD-10-CM | POA: Diagnosis not present

## 2020-10-05 ENCOUNTER — Other Ambulatory Visit: Payer: Self-pay

## 2020-10-05 ENCOUNTER — Ambulatory Visit
Admission: RE | Admit: 2020-10-05 | Discharge: 2020-10-05 | Disposition: A | Discharge status: Home / Self Care | Payer: Medicare HMO | Source: Ambulatory Visit | Attending: Nurse Practitioner | Admitting: Nurse Practitioner

## 2020-10-05 DIAGNOSIS — Z1231 Encounter for screening mammogram for malignant neoplasm of breast: Secondary | ICD-10-CM | POA: Insufficient documentation

## 2020-10-11 DIAGNOSIS — E119 Type 2 diabetes mellitus without complications: Secondary | ICD-10-CM | POA: Diagnosis not present

## 2020-10-11 DIAGNOSIS — E785 Hyperlipidemia, unspecified: Secondary | ICD-10-CM | POA: Diagnosis not present

## 2020-10-11 DIAGNOSIS — E559 Vitamin D deficiency, unspecified: Secondary | ICD-10-CM | POA: Diagnosis not present

## 2020-10-11 DIAGNOSIS — I10 Essential (primary) hypertension: Secondary | ICD-10-CM | POA: Diagnosis not present

## 2020-10-12 DIAGNOSIS — R0602 Shortness of breath: Secondary | ICD-10-CM | POA: Diagnosis not present

## 2020-10-12 DIAGNOSIS — K219 Gastro-esophageal reflux disease without esophagitis: Secondary | ICD-10-CM | POA: Diagnosis not present

## 2020-10-12 DIAGNOSIS — E669 Obesity, unspecified: Secondary | ICD-10-CM | POA: Diagnosis not present

## 2020-10-12 DIAGNOSIS — E785 Hyperlipidemia, unspecified: Secondary | ICD-10-CM | POA: Diagnosis not present

## 2020-10-12 DIAGNOSIS — I10 Essential (primary) hypertension: Secondary | ICD-10-CM | POA: Diagnosis not present

## 2020-10-13 DIAGNOSIS — K5900 Constipation, unspecified: Secondary | ICD-10-CM | POA: Diagnosis not present

## 2020-10-13 DIAGNOSIS — I10 Essential (primary) hypertension: Secondary | ICD-10-CM | POA: Diagnosis not present

## 2020-10-13 DIAGNOSIS — E785 Hyperlipidemia, unspecified: Secondary | ICD-10-CM | POA: Diagnosis not present

## 2020-10-13 DIAGNOSIS — F319 Bipolar disorder, unspecified: Secondary | ICD-10-CM | POA: Diagnosis not present

## 2020-10-13 DIAGNOSIS — D519 Vitamin B12 deficiency anemia, unspecified: Secondary | ICD-10-CM | POA: Diagnosis not present

## 2020-10-13 DIAGNOSIS — E119 Type 2 diabetes mellitus without complications: Secondary | ICD-10-CM | POA: Diagnosis not present

## 2020-10-19 DIAGNOSIS — J4540 Moderate persistent asthma, uncomplicated: Secondary | ICD-10-CM | POA: Diagnosis not present

## 2020-12-27 ENCOUNTER — Other Ambulatory Visit: Payer: Medicare HMO

## 2021-01-06 ENCOUNTER — Ambulatory Visit
Admission: RE | Admit: 2021-01-06 | Discharge: 2021-01-06 | Disposition: A | Discharge status: Home / Self Care | Payer: Medicare HMO | Attending: Urology | Admitting: Urology

## 2021-01-06 ENCOUNTER — Other Ambulatory Visit: Payer: Self-pay

## 2021-01-06 ENCOUNTER — Encounter: Payer: Self-pay | Admitting: Urology

## 2021-01-06 ENCOUNTER — Ambulatory Visit (INDEPENDENT_AMBULATORY_CARE_PROVIDER_SITE_OTHER): Payer: Medicare HMO | Admitting: Urology

## 2021-01-06 ENCOUNTER — Ambulatory Visit
Admission: RE | Admit: 2021-01-06 | Discharge: 2021-01-06 | Disposition: A | Discharge status: Home / Self Care | Payer: Medicare HMO | Source: Ambulatory Visit | Attending: Urology | Admitting: Urology

## 2021-01-06 VITALS — BP 138/82 | HR 72 | Ht 60.0 in | Wt 162.0 lb

## 2021-01-06 DIAGNOSIS — N200 Calculus of kidney: Secondary | ICD-10-CM

## 2021-01-06 DIAGNOSIS — R3129 Other microscopic hematuria: Secondary | ICD-10-CM

## 2021-01-06 NOTE — Addendum Note (Signed)
Addended by: Chrystie Nose on: 01/06/2021 02:35 PM   Modules accepted: Orders

## 2021-01-06 NOTE — Progress Notes (Signed)
01/06/2021 1:36 PM   Hannah Clarke 09/21/1966 [id number]  Referring provider: Danelle Berry, NP [address] [place],  [place] [address]  Chief Complaint  Patient presents with  . Nephrolithiasis    HPI: 55 year old female presents for follow-up of nephrolithiasis.   CT performed at [facility] 03/2020 which showed a nonobstructing left renal calculus; surveillance was recommended  Complains of intermittent bilateral lower pelvic discomfort  No flank pain  Occasional mild dysuria  KUB performed today suboptimal due to body habitus and overlying stool and bowel gas   PMH: Past Medical History:  Diagnosis Date  . Anxiety   . Chronic headaches   . Depression   . Dermatophytosis of nail   . Diabetes mellitus without complication ([facility])   . GERD (gastroesophageal reflux disease)   . High cholesterol   . Hypertension   . Mental developmental delay   . Onychia of toe   . Panic attacks   . Renal insufficiency     Surgical History: Past Surgical History:  Procedure Laterality Date  . COLONOSCOPY WITH PROPOFOL N/A 10/09/2017   Procedure: COLONOSCOPY WITH PROPOFOL;  Surgeon: Toledo, Benay Pike, MD;  Location: [facility];  Service: Gastroenterology;  Laterality: N/A;  . COLONOSCOPY WITH PROPOFOL N/A 02/06/2018   Procedure: COLONOSCOPY WITH PROPOFOL;  Surgeon: Toledo, Benay Pike, MD;  Location: [facility];  Service: Gastroenterology;  Laterality: N/A;  . ESOPHAGOGASTRODUODENOSCOPY (EGD) WITH PROPOFOL N/A 10/09/2017   Procedure: ESOPHAGOGASTRODUODENOSCOPY (EGD) WITH PROPOFOL;  Surgeon: Toledo, Benay Pike, MD;  Location: [facility];  Service: Gastroenterology;  Laterality: N/A;  . ESOPHAGOGASTRODUODENOSCOPY (EGD) WITH PROPOFOL N/A 07/31/2018   Procedure: ESOPHAGOGASTRODUODENOSCOPY (EGD) WITH PROPOFOL;  Surgeon: Toledo, Benay Pike, MD;  Location: [facility];  Service: Gastroenterology;  Laterality: N/A;  . HAND SURGERY    . KIDNEY STONE SURGERY      Home  Medications:  Allergies as of 01/06/2021   No Known Allergies     Medication List       Accurate as of January 06, 2021  1:36 PM. If you have any questions, ask your nurse or doctor.        acetaminophen 325 MG tablet Commonly known as: TYLENOL Take 325 mg by mouth every 6 (six) hours as needed.   Advair Diskus 250-50 MCG/DOSE Aepb Generic drug: Fluticasone-Salmeterol advair diskus 250-50 mcg/dose aepb   albuterol 108 (90 Base) MCG/ACT inhaler Commonly known as: VENTOLIN HFA Inhale 2 puffs into the lungs every 6 (six) hours as needed for wheezing or shortness of breath.   amLODipine 5 MG tablet Commonly known as: NORVASC Take 5 mg by mouth daily.   azithromycin 250 MG tablet Commonly known as: Zithromax Z-Pak Take as directed   cetirizine 10 MG tablet Commonly known as: ZYRTEC Take 10 mg by mouth daily.   cholecalciferol 1000 units tablet Commonly known as: VITAMIN D Take 1,000 Units by mouth daily.   citalopram 20 MG tablet Commonly known as: CELEXA Take 20 mg by mouth daily.   clopidogrel 75 MG tablet Commonly known as: PLAVIX Take 75 mg by mouth daily.   Dexilant 60 MG capsule Generic drug: dexlansoprazole Take 60 mg by mouth daily.   lactulose 10 GM/15ML solution Commonly known as: CHRONULAC Take 30 mLs (20 g total) by mouth daily as needed for mild constipation.   LORazepam 1 MG tablet Commonly known as: ATIVAN Take 1 tablet (1 mg total) by mouth every 8 (eight) hours as needed for anxiety.   medroxyPROGESTERone 150 MG/ML injection Commonly  known as: DEPO-PROVERA Inject 150 mg into the muscle every 3 (three) months.   metFORMIN 500 MG tablet Commonly known as: GLUCOPHAGE Take 500 mg by mouth 2 (two) times daily with a meal.   metoCLOPramide 10 MG tablet Commonly known as: REGLAN Take 1 tablet (10 mg total) by mouth 3 (three) times daily before meals.   ondansetron 4 MG disintegrating tablet Commonly known as: Zofran ODT Take 1 tablet (4 mg  total) by mouth every 8 (eight) hours as needed for nausea or vomiting.   ondansetron 4 MG tablet Commonly known as: Zofran Take 1 tablet (4 mg total) by mouth every 8 (eight) hours as needed.   polyethylene glycol 17 g packet Commonly known as: MIRALAX / GLYCOLAX Take 17 g by mouth every other day.   potassium chloride SA 20 MEQ tablet Commonly known as: KLOR-CON Take 20 mEq by mouth daily.   sennosides-docusate sodium 8.6-50 MG tablet Commonly known as: SENOKOT-S Take 1 tablet by mouth daily.   simvastatin 20 MG tablet Commonly known as: ZOCOR Take 20 mg by mouth daily.   sucralfate 1 g tablet Commonly known as: CARAFATE Take 1 g by mouth 4 (four) times daily -  with meals and at bedtime.       Allergies: No Known Allergies  Family History: Family History  Problem Relation Age of Onset  . Breast cancer Mother 45       pre pt.     Social History:  reports that she has never smoked. She has never used smokeless tobacco. She reports current alcohol use. She reports that she does not use drugs.   Physical Exam: BP 138/82   Pulse 72   Ht 5' (1.524 m)   Wt 162 lb (73.5 kg)   BMI 31.64 kg/m   Constitutional:  Alert and oriented, No acute distress. HEENT: [place] AT, moist mucus membranes.  Trachea midline, no masses. Cardiovascular: No clubbing, cyanosis, or edema. Respiratory: Normal respiratory effort, no increased work of breathing.   Pertinent Imaging: KUB personally reviewed and interpreted  Assessment & Plan:    1.  Left nephrolithiasis  Unlikely a source of her lower bilateral pelvic pain  Urinalysis ordered  43-month follow-up with renal ultrasound   [name], MD  [facility] [address] [place], [place] [address] [phone]

## 2021-01-07 ENCOUNTER — Other Ambulatory Visit: Payer: Medicare HMO

## 2021-01-07 DIAGNOSIS — R3129 Other microscopic hematuria: Secondary | ICD-10-CM

## 2021-01-07 LAB — URINALYSIS, COMPLETE
Bilirubin, UA: NEGATIVE
Glucose, UA: NEGATIVE
Ketones, UA: NEGATIVE
Nitrite, UA: POSITIVE — AB
Specific Gravity, UA: 1.02 (ref 1.005–1.030)
Urobilinogen, Ur: 0.2 mg/dL (ref 0.2–1.0)
pH, UA: 5 (ref 5.0–7.5)

## 2021-01-07 LAB — MICROSCOPIC EXAMINATION: WBC, UA: >30 /hpf — AB (ref 0–5)

## 2021-01-13 ENCOUNTER — Telehealth: Payer: Self-pay | Admitting: *Deleted

## 2021-01-13 LAB — CULTURE, URINE COMPREHENSIVE

## 2021-01-13 MED ORDER — SULFAMETHOXAZOLE-TRIMETHOPRIM 800-160 MG PO TABS: 1.0000 | ORAL_TABLET | Freq: Two times a day (BID) | ORAL | 0 refills | Status: AC

## 2021-01-13 NOTE — Telephone Encounter (Signed)
Pt returned call. Verbalized understanding. ,medication sent to pharmacy.

## 2021-01-13 NOTE — Telephone Encounter (Signed)
-----   Message from [name], MD sent at 01/13/2021  8:17 AM EST ----- Urinalysis was positive for infection.  Please send Rx Septra DS 1 tab twice daily for 7 days

## 2021-05-03 ENCOUNTER — Other Ambulatory Visit: Payer: Self-pay

## 2021-05-03 ENCOUNTER — Emergency Department
Admission: EM | Admit: 2021-05-03 | Discharge: 2021-05-03 | Disposition: A | Discharge status: Home / Self Care | Payer: Medicare HMO | Attending: Emergency Medicine | Admitting: Emergency Medicine

## 2021-05-03 ENCOUNTER — Other Ambulatory Visit
Admission: RE | Admit: 2021-05-03 | Discharge: 2021-05-03 | Disposition: A | Discharge status: Home / Self Care | Payer: Medicare HMO | Source: Ambulatory Visit | Attending: Pulmonary Disease | Admitting: Pulmonary Disease

## 2021-05-03 ENCOUNTER — Emergency Department: Payer: Medicare HMO

## 2021-05-03 DIAGNOSIS — Y9389 Activity, other specified: Secondary | ICD-10-CM | POA: Insufficient documentation

## 2021-05-03 DIAGNOSIS — M25521 Pain in right elbow: Secondary | ICD-10-CM | POA: Diagnosis not present

## 2021-05-03 DIAGNOSIS — I10 Essential (primary) hypertension: Secondary | ICD-10-CM | POA: Insufficient documentation

## 2021-05-03 DIAGNOSIS — W01198A Fall on same level from slipping, tripping and stumbling with subsequent striking against other object, initial encounter: Secondary | ICD-10-CM | POA: Insufficient documentation

## 2021-05-03 DIAGNOSIS — E119 Type 2 diabetes mellitus without complications: Secondary | ICD-10-CM | POA: Insufficient documentation

## 2021-05-03 DIAGNOSIS — S0083XA Contusion of other part of head, initial encounter: Secondary | ICD-10-CM | POA: Diagnosis not present

## 2021-05-03 DIAGNOSIS — W19XXXA Unspecified fall, initial encounter: Secondary | ICD-10-CM

## 2021-05-03 DIAGNOSIS — Z79899 Other long term (current) drug therapy: Secondary | ICD-10-CM | POA: Insufficient documentation

## 2021-05-03 DIAGNOSIS — Z20822 Contact with and (suspected) exposure to covid-19: Secondary | ICD-10-CM | POA: Insufficient documentation

## 2021-05-03 DIAGNOSIS — Z7984 Long term (current) use of oral hypoglycemic drugs: Secondary | ICD-10-CM | POA: Diagnosis not present

## 2021-05-03 DIAGNOSIS — S0990XA Unspecified injury of head, initial encounter: Secondary | ICD-10-CM | POA: Diagnosis present

## 2021-05-03 DIAGNOSIS — S0093XA Contusion of unspecified part of head, initial encounter: Secondary | ICD-10-CM

## 2021-05-03 DIAGNOSIS — Z01812 Encounter for preprocedural laboratory examination: Secondary | ICD-10-CM | POA: Insufficient documentation

## 2021-05-03 LAB — SARS CORONAVIRUS 2 (TAT 6-24 HRS): SARS Coronavirus 2: NEGATIVE

## 2021-05-03 MED ORDER — ACETAMINOPHEN 325 MG PO TABS
650.0000 mg | ORAL_TABLET | Freq: Once | ORAL | Status: AC
  Administered 2021-05-03: 650 mg via ORAL
  Filled 2021-05-03: qty 2

## 2021-05-03 NOTE — ED Provider Notes (Signed)
[facility] Emergency Department Provider Note  ____________________________________________   Event Date/Time   First MD Initiated Contact with Patient 05/03/21 1941     (approximate)  I have reviewed the triage vital signs and the nursing notes.   HISTORY  Chief Complaint Fall  HPI Hannah Clarke is a 55 y.o. female with the below medical history, presents to the ED for evaluation of injury sustained following mechanical fall.  Patient presents by EMS from home, after she tripped over the curb while carrying her groceries.  She apparently fell back hitting the side of her head and her elbow.  She denies any loss of consciousness, denies any current blood thinner use.  She rates her overall pain at a 6 out of 10 at this time.  Past Medical History:  Diagnosis Date  . Anxiety   . Chronic headaches   . Depression   . Dermatophytosis of nail   . Diabetes mellitus without complication ([place])   . GERD (gastroesophageal reflux disease)   . High cholesterol   . Hypertension   . Mental developmental delay   . Onychia of toe   . Panic attacks   . Renal insufficiency     Patient Active Problem List   Diagnosis Date Noted  . Microhematuria 04/22/2020  . Left lower quadrant abdominal pain 04/22/2020  . Nephrolithiasis 04/22/2020  . Intractable nausea and vomiting 08/01/2018  . Unstable angina ([place]) 08/17/2017    Past Surgical History:  Procedure Laterality Date  . COLONOSCOPY WITH PROPOFOL N/A 10/09/2017   Procedure: COLONOSCOPY WITH PROPOFOL;  Surgeon: Toledo, Benay Pike, MD;  Location: [facility] ENDOSCOPY;  Service: Gastroenterology;  Laterality: N/A;  . COLONOSCOPY WITH PROPOFOL N/A 02/06/2018   Procedure: COLONOSCOPY WITH PROPOFOL;  Surgeon: Toledo, Benay Pike, MD;  Location: [facility] ENDOSCOPY;  Service: Gastroenterology;  Laterality: N/A;  . ESOPHAGOGASTRODUODENOSCOPY (EGD) WITH PROPOFOL N/A 10/09/2017   Procedure: ESOPHAGOGASTRODUODENOSCOPY (EGD) WITH  PROPOFOL;  Surgeon: Toledo, Benay Pike, MD;  Location: [facility] ENDOSCOPY;  Service: Gastroenterology;  Laterality: N/A;  . ESOPHAGOGASTRODUODENOSCOPY (EGD) WITH PROPOFOL N/A 07/31/2018   Procedure: ESOPHAGOGASTRODUODENOSCOPY (EGD) WITH PROPOFOL;  Surgeon: Toledo, Benay Pike, MD;  Location: [facility] ENDOSCOPY;  Service: Gastroenterology;  Laterality: N/A;  . HAND SURGERY    . KIDNEY STONE SURGERY      Prior to Admission medications   Medication Sig Start Date End Date Taking? Authorizing Provider  acetaminophen (TYLENOL) 325 MG tablet Take 325 mg by mouth every 6 (six) hours as needed.    [provider]  albuterol (PROVENTIL HFA;VENTOLIN HFA) 108 (90 BASE) MCG/ACT inhaler Inhale 2 puffs into the lungs every 6 (six) hours as needed for wheezing or shortness of breath. 11/07/15   Orbie Pyo, MD  amLODipine (NORVASC) 5 MG tablet Take 5 mg by mouth daily.    [provider]  azithromycin (ZITHROMAX Z-PAK) 250 MG tablet Take as directed 08/03/19   Paulette Blanch, MD  cetirizine (ZYRTEC) 10 MG tablet Take 10 mg by mouth daily.    [provider]  cholecalciferol (VITAMIN D) 1000 units tablet Take 1,000 Units by mouth daily.    [provider]  citalopram (CELEXA) 20 MG tablet Take 20 mg by mouth daily.    [provider]  clopidogrel (PLAVIX) 75 MG tablet Take 75 mg by mouth daily.    [provider]  dexlansoprazole (DEXILANT) 60 MG capsule Take 60 mg by mouth daily.    [provider]  Fluticasone-Salmeterol (ADVAIR DISKUS) 250-50 MCG/DOSE  AEPB advair diskus 250-50 mcg/dose aepb    [provider]  lactulose (CHRONULAC) 10 GM/15ML solution Take 30 mLs (20 g total) by mouth daily as needed for mild constipation. 08/03/19   Paulette Blanch, MD  LORazepam (ATIVAN) 1 MG tablet Take 1 tablet (1 mg total) by mouth every 8 (eight) hours as needed for anxiety. 02/03/16   Paulette Blanch, MD  medroxyPROGESTERone (DEPO-PROVERA) 150 MG/ML injection  Inject 150 mg into the muscle every 3 (three) months.    [provider]  metFORMIN (GLUCOPHAGE) 500 MG tablet Take 500 mg by mouth 2 (two) times daily with a meal.    [provider]  metoCLOPramide (REGLAN) 10 MG tablet Take 1 tablet (10 mg total) by mouth 3 (three) times daily before meals. 08/04/18 10/03/18  Gladstone Lighter, MD  ondansetron (ZOFRAN ODT) 4 MG disintegrating tablet Take 1 tablet (4 mg total) by mouth every 8 (eight) hours as needed for nausea or vomiting. 08/01/18   Harvest Dark, MD  ondansetron (ZOFRAN) 4 MG tablet Take 1 tablet (4 mg total) by mouth every 8 (eight) hours as needed. 08/10/19   Nance Pear, MD  polyethylene glycol [facility] / Floria Raveling) packet Take 17 g by mouth every other day.    [provider]  potassium chloride SA (K-DUR,KLOR-CON) 20 MEQ tablet Take 20 mEq by mouth daily.    [provider]  sennosides-docusate sodium (SENOKOT-S) 8.6-50 MG tablet Take 1 tablet by mouth daily.    [provider]  simvastatin (ZOCOR) 20 MG tablet Take 20 mg by mouth daily.    [provider]  sucralfate (CARAFATE) 1 g tablet Take 1 g by mouth 4 (four) times daily -  with meals and at bedtime.    [provider]    Allergies Patient has no known allergies.  Family History  Problem Relation Age of Onset  . Breast cancer Mother 81       pre pt.     Social History Social History   Tobacco Use  . Smoking status: Never Smoker  . Smokeless tobacco: Never Used  Vaping Use  . Vaping Use: Never used  Substance Use Topics  . Alcohol use: Yes    Comment: OCCASSIONALLY  . Drug use: No    Review of Systems  Constitutional: No fever/chills Eyes: No visual changes. ENT: No sore throat. Cardiovascular: Denies chest pain. Respiratory: Denies shortness of breath. Gastrointestinal: No abdominal pain.  No nausea, no vomiting.  No diarrhea.  No constipation. Genitourinary: Negative for  dysuria. Musculoskeletal: Negative for back pain. Reports right elbow pain Skin: Negative for rash. Head contusion Neurological: Negative for headaches, focal weakness or numbness. ____________________________________________   PHYSICAL EXAM:  VITAL SIGNS: ED Triage Vitals  Enc Vitals Group     BP 05/03/21 1843 (!) 140/97     Pulse Rate 05/03/21 1843 (!) 110     Resp 05/03/21 1843 16     Temp 05/03/21 1843 98 F (36.7 C)     Temp src --      SpO2 05/03/21 1843 97 %     Weight --      Height --      Head Circumference --      Peak Flow --      Pain Score 05/03/21 1843 6     Pain Loc --      Pain Edu? --      Excl. in [place]? --    Constitutional: Alert and oriented. Well  appearing and in no acute distress. Eyes: Conjunctivae are normal. PERRL. EOMI. Head: Atraumatic. Nose: No congestion/rhinnorhea. Mouth/Throat: Mucous membranes are moist.  Oropharynx non-erythematous. Neck: No stridor.  No cervical spine tenderness to palpation. Cardiovascular: Normal rate, regular rhythm. Grossly normal heart sounds.  Good peripheral circulation. Respiratory: Normal respiratory effort.  No retractions. Lungs CTAB. Gastrointestinal: Soft and nontender. No distention. No abdominal bruits. No CVA tenderness. Musculoskeletal: No lower extremity tenderness nor edema.  No joint effusions. Neurologic:  Normal speech and language. No gross focal neurologic deficits are appreciated. No gait instability. Skin:  Skin is warm, dry and intact. No rash noted. Psychiatric: Mood and affect are normal. Speech and behavior are normal. ____________________________________________   LABS (all labs ordered are listed, but only abnormal results are displayed)  Labs Reviewed - No data to display ____________________________________________  EKG  ____________________________________________  RADIOLOGY I, Melvenia Needles, personally viewed and evaluated these images (plain radiographs) as part of my  medical decision making, as well as reviewing the written report by the radiologist.  ED MD interpretation:  Agree with report  Official radiology report(s): CT Head Wo Contrast  Result Date: 05/03/2021 CLINICAL DATA:  55 year old female with head trauma. EXAM: CT HEAD WITHOUT CONTRAST CT CERVICAL SPINE WITHOUT CONTRAST TECHNIQUE: Multidetector CT imaging of the head and cervical spine was performed following the standard protocol without intravenous contrast. Multiplanar CT image reconstructions of the cervical spine were also generated. COMPARISON:  None. FINDINGS: CT HEAD FINDINGS Brain: The ventricles and sulci are appropriate size for patient's age. The gray-white matter discrimination is preserved. There is no acute intracranial hemorrhage. No mass effect midline shift no extra-axial fluid collection Vascular: No hyperdense vessel or unexpected calcification. Skull: Normal. Negative for fracture or focal lesion. Sinuses/Orbits: No acute finding. Other: Right posterior scalp contusion. CT CERVICAL SPINE FINDINGS Alignment: No acute subluxation. There is straightening of normal cervical lordosis which may be positional or due to muscle spasm. Skull base and vertebrae: No acute fracture. Osteopenia Soft tissues and spinal canal: No prevertebral fluid or swelling. No visible canal hematoma. Disc levels: Multilevel degenerative changes with disc space narrowing and endplate irregularity and spurring. Upper chest: Negative. Other: None IMPRESSION: 1. No acute intracranial pathology. 2. No acute/traumatic cervical spine pathology. Multilevel degenerative changes. Electronically Signed   By: Anner Crete M.D.   On: 05/03/2021 21:06   CT Cervical Spine Wo Contrast  Result Date: 05/03/2021 CLINICAL DATA:  54 year old female with head trauma. EXAM: CT HEAD WITHOUT CONTRAST CT CERVICAL SPINE WITHOUT CONTRAST TECHNIQUE: Multidetector CT imaging of the head and cervical spine was performed following the  standard protocol without intravenous contrast. Multiplanar CT image reconstructions of the cervical spine were also generated. COMPARISON:  None. FINDINGS: CT HEAD FINDINGS Brain: The ventricles and sulci are appropriate size for patient's age. The gray-white matter discrimination is preserved. There is no acute intracranial hemorrhage. No mass effect midline shift no extra-axial fluid collection Vascular: No hyperdense vessel or unexpected calcification. Skull: Normal. Negative for fracture or focal lesion. Sinuses/Orbits: No acute finding. Other: Right posterior scalp contusion. CT CERVICAL SPINE FINDINGS Alignment: No acute subluxation. There is straightening of normal cervical lordosis which may be positional or due to muscle spasm. Skull base and vertebrae: No acute fracture. Osteopenia Soft tissues and spinal canal: No prevertebral fluid or swelling. No visible canal hematoma. Disc levels: Multilevel degenerative changes with disc space narrowing and endplate irregularity and spurring. Upper chest: Negative. Other: None IMPRESSION: 1. No acute intracranial pathology. 2. No acute/traumatic  cervical spine pathology. Multilevel degenerative changes. Electronically Signed   By: Anner Crete M.D.   On: 05/03/2021 21:06   ____________________________________________   PROCEDURES  Procedure(s) performed (including Critical Care):  Procedures  Tylenol 650 mg p.o. ____________________________________________   INITIAL IMPRESSION / ASSESSMENT AND PLAN / ED COURSE  As part of my medical decision making, I reviewed the following data within the [place] reviewed as above and Notes from prior ED visits  Patient ED evaluation of injury sustained following a mechanical fall.  She was evaluated for complaints which included posterior head contusion, and elbow pain.  Patient CT of the head and neck were negative for any acute findings.  Elbow exam is benign.  Patient is  discharged to follow-up with her primary provider for ongoing symptoms.  Return precautions have been   ____________________________________________   FINAL CLINICAL IMPRESSION(S) / ED DIAGNOSES  Final diagnoses:  Fall, initial encounter  Contusion of head, unspecified part of head, initial encounter     ED Discharge Orders    None       Note:  This document was prepared using Dragon voice recognition software and may include unintentional dictation errors.    Melvenia Needles, PA-C 05/03/21 2206    Vanessa Winchester, MD 05/05/21 [id number]

## 2021-05-03 NOTE — ED Triage Notes (Addendum)
Pt comes via EMS with c/o fall.  Pt was carrying her groceries and tripped over the curb. Pt states she hit back side of head and elbow. Pt denies any LOC or blood thinners.  BP-188/98 HR-116 98% RA

## 2021-05-03 NOTE — Discharge Instructions (Signed)
Your exam and CT scans are normal and reassuring at this time.  There is no signs of any serious injury related to your fall.  You may experience several days of soreness and stiffness.  Take over-the-counter Tylenol as needed for pain relief.  Follow-up with your primary provider for ongoing symptoms.  Return to the ED if needed.

## 2021-05-05 ENCOUNTER — Ambulatory Visit: Payer: Medicare HMO

## 2021-05-11 ENCOUNTER — Ambulatory Visit: Payer: Medicare HMO | Attending: Internal Medicine

## 2021-05-11 DIAGNOSIS — G4761 Periodic limb movement disorder: Secondary | ICD-10-CM | POA: Insufficient documentation

## 2021-05-11 DIAGNOSIS — G4733 Obstructive sleep apnea (adult) (pediatric): Secondary | ICD-10-CM | POA: Insufficient documentation

## 2021-05-12 ENCOUNTER — Other Ambulatory Visit: Payer: Self-pay

## 2021-05-13 DIAGNOSIS — G4730 Sleep apnea, unspecified: Secondary | ICD-10-CM | POA: Diagnosis not present

## 2021-05-24 DIAGNOSIS — D519 Vitamin B12 deficiency anemia, unspecified: Secondary | ICD-10-CM | POA: Diagnosis not present

## 2021-07-01 DIAGNOSIS — D519 Vitamin B12 deficiency anemia, unspecified: Secondary | ICD-10-CM | POA: Diagnosis not present

## 2021-07-04 ENCOUNTER — Other Ambulatory Visit: Payer: Self-pay | Admitting: *Deleted

## 2021-07-04 DIAGNOSIS — N200 Calculus of kidney: Secondary | ICD-10-CM

## 2021-07-04 NOTE — Progress Notes (Signed)
.  ku

## 2021-07-06 ENCOUNTER — Ambulatory Visit
Admission: RE | Admit: 2021-07-06 | Discharge: 2021-07-06 | Disposition: A | Discharge status: Home / Self Care | Payer: Medicare HMO | Source: Home / Self Care | Attending: Urology | Admitting: Urology

## 2021-07-06 ENCOUNTER — Ambulatory Visit
Admission: RE | Admit: 2021-07-06 | Discharge: 2021-07-06 | Disposition: A | Discharge status: Home / Self Care | Payer: Medicare HMO | Source: Ambulatory Visit | Attending: Urology | Admitting: Urology

## 2021-07-06 ENCOUNTER — Other Ambulatory Visit: Payer: Self-pay

## 2021-07-06 DIAGNOSIS — R109 Unspecified abdominal pain: Secondary | ICD-10-CM | POA: Diagnosis not present

## 2021-07-06 DIAGNOSIS — K760 Fatty (change of) liver, not elsewhere classified: Secondary | ICD-10-CM | POA: Diagnosis not present

## 2021-07-06 DIAGNOSIS — N200 Calculus of kidney: Secondary | ICD-10-CM | POA: Diagnosis not present

## 2021-07-06 DIAGNOSIS — R1030 Lower abdominal pain, unspecified: Secondary | ICD-10-CM | POA: Diagnosis not present

## 2021-07-08 ENCOUNTER — Ambulatory Visit (INDEPENDENT_AMBULATORY_CARE_PROVIDER_SITE_OTHER): Payer: Medicare HMO | Admitting: Urology

## 2021-07-08 ENCOUNTER — Encounter: Payer: Self-pay | Admitting: Urology

## 2021-07-08 ENCOUNTER — Other Ambulatory Visit: Payer: Self-pay

## 2021-07-08 VITALS — BP 115/79 | HR 99 | Ht 60.0 in | Wt 162.0 lb

## 2021-07-08 DIAGNOSIS — N3001 Acute cystitis with hematuria: Secondary | ICD-10-CM | POA: Diagnosis not present

## 2021-07-08 DIAGNOSIS — R3129 Other microscopic hematuria: Secondary | ICD-10-CM

## 2021-07-08 DIAGNOSIS — N200 Calculus of kidney: Secondary | ICD-10-CM

## 2021-07-08 LAB — URINALYSIS, COMPLETE
Bilirubin, UA: NEGATIVE
Glucose, UA: NEGATIVE
Ketones, UA: NEGATIVE
Nitrite, UA: NEGATIVE
Specific Gravity, UA: 1.02 (ref 1.005–1.030)
Urobilinogen, Ur: 0.2 mg/dL (ref 0.2–1.0)
pH, UA: 5.5 (ref 5.0–7.5)

## 2021-07-08 LAB — MICROSCOPIC EXAMINATION: WBC, UA: >30 /hpf — AB (ref 0–5)

## 2021-07-08 MED ORDER — SULFAMETHOXAZOLE-TRIMETHOPRIM 800-160 MG PO TABS: 1.0000 | ORAL_TABLET | Freq: Two times a day (BID) | ORAL | 0 refills | Status: DC

## 2021-07-08 NOTE — Progress Notes (Signed)
07/08/2021 4:37 PM   Hannah Clarke [birth year] [id number]  Referring provider: Danelle Berry, NP [address] [place],  [place] [address]  Chief Complaint  Patient presents with   Follow-up    [facility] follow-up    Urologic history: 1.  Left nephrolithiasis Nonobstructing left renal calculus   HPI: 55y.o. female presents for semiannual follow-up.  Has recently developed increased urinary frequency, urgency and dysuria Has noted a small amount of blood when wiping No fever or chills KUB and renal ultrasound performed with stable, nonobstructing 6-8 mm left renal calculus   PMH: Past Medical History:  Diagnosis Date   Anxiety    Chronic headaches    Depression    Dermatophytosis of nail    Diabetes mellitus without complication (HCC)    GERD (gastroesophageal reflux disease)    High cholesterol    Hypertension    Mental developmental delay    Onychia of toe    Panic attacks    Renal insufficiency     Surgical History: Past Surgical History:  Procedure Laterality Date   COLONOSCOPY WITH PROPOFOL N/A 10/09/2017   Procedure: COLONOSCOPY WITH PROPOFOL;  Surgeon: Toledo, TBenay Pike MD;  Location: [facility] ENDOSCOPY;  Service: Gastroenterology;  Laterality: N/A;   COLONOSCOPY WITH PROPOFOL N/A 02/06/2018   Procedure: COLONOSCOPY WITH PROPOFOL;  Surgeon: Toledo, TBenay Pike MD;  Location: [facility] ENDOSCOPY;  Service: Gastroenterology;  Laterality: N/A;   ESOPHAGOGASTRODUODENOSCOPY (EGD) WITH PROPOFOL N/A 10/09/2017   Procedure: ESOPHAGOGASTRODUODENOSCOPY (EGD) WITH PROPOFOL;  Surgeon: Toledo, TBenay Pike MD;  Location: [facility] ENDOSCOPY;  Service: Gastroenterology;  Laterality: N/A;   ESOPHAGOGASTRODUODENOSCOPY (EGD) WITH PROPOFOL N/A 07/31/2018   Procedure: ESOPHAGOGASTRODUODENOSCOPY (EGD) WITH PROPOFOL;  Surgeon: Toledo, TBenay Pike MD;  Location: [facility] ENDOSCOPY;  Service: Gastroenterology;  Laterality: N/A;   HAND SURGERY     KIDNEY STONE SURGERY      Home Medications:   Allergies as of 07/08/2021   No Known Allergies      Medication List        Accurate as of July 08, 2021  4:37 PM. If you have any questions, ask your nurse or doctor.          STOP taking these medications    azithromycin 250 MG tablet Commonly known as: Zithromax Z-Pak Stopped by: SAbbie Sons MD   ondansetron 4 MG disintegrating tablet Commonly known as: Zofran ODT Stopped by: SAbbie Sons MD   ondansetron 4 MG tablet Commonly known as: Zofran Stopped by: SAbbie Sons MD       TAKE these medications    acetaminophen 325 MG tablet Commonly known as: TYLENOL Take 325 mg by mouth every 6 (six) hours as needed.   albuterol 108 (90 Base) MCG/ACT inhaler Commonly known as: VENTOLIN HFA Inhale 2 puffs into the lungs every 6 (six) hours as needed for wheezing or shortness of breath.   amLODipine 5 MG tablet Commonly known as: NORVASC Take 5 mg by mouth daily.   cetirizine 10 MG tablet Commonly known as: ZYRTEC Take 10 mg by mouth daily.   cholecalciferol 1000 units tablet Commonly known as: VITAMIN D Take 1,000 Units by mouth daily.   citalopram 20 MG tablet Commonly known as: CELEXA Take 20 mg by mouth daily.   clopidogrel 75 MG tablet Commonly known as: PLAVIX Take 75 mg by mouth daily.   dexlansoprazole 60 MG capsule Commonly known as: DEXILANT Take 60 mg by mouth daily.   Fluticasone-Salmeterol 250-50 MCG/DOSE Aepb Commonly known as:  ADVAIR advair diskus 250-50 mcg/dose aepb   lactulose 10 GM/15ML solution Commonly known as: CHRONULAC Take 30 mLs (20 g total) by mouth daily as needed for mild constipation.   LORazepam 1 MG tablet Commonly known as: ATIVAN Take 1 tablet (1 mg total) by mouth every 8 (eight) hours as needed for anxiety.   medroxyPROGESTERone 150 MG/ML injection Commonly known as: DEPO-PROVERA Inject 150 mg into the muscle every 3 (three) months.   metFORMIN 500 MG tablet Commonly known as: GLUCOPHAGE Take  500 mg by mouth 2 (two) times daily with a meal.   metoCLOPramide 10 MG tablet Commonly known as: REGLAN Take 1 tablet (10 mg total) by mouth 3 (three) times daily before meals.   polyethylene glycol 17 g packet Commonly known as: MIRALAX / GLYCOLAX Take 17 g by mouth every other day.   potassium chloride SA 20 MEQ tablet Commonly known as: KLOR-CON Take 20 mEq by mouth daily.   sennosides-docusate sodium 8.6-50 MG tablet Commonly known as: SENOKOT-S Take 1 tablet by mouth daily.   simvastatin 20 MG tablet Commonly known as: ZOCOR Take 20 mg by mouth daily.   sucralfate 1 g tablet Commonly known as: CARAFATE Take 1 g by mouth 4 (four) times daily -  with meals and at bedtime.   sulfamethoxazole-trimethoprim 800-160 MG tablet Commonly known as: BACTRIM DS Take 1 tablet by mouth every 12 (twelve) hours. Started by: [name], MD        Allergies: No Known Allergies  Family History: Family History  Problem Relation Age of Onset   Breast cancer Mother 64       pre pt.     Social History:  reports that she has never smoked. She has never used smokeless tobacco. She reports current alcohol use. She reports that she does not use drugs.   Physical Exam: BP 115/79   Pulse 99   Ht 5' (1.524 m)   Wt 162 lb (73.5 kg)   BMI 31.64 kg/m   Constitutional:  Alert, no acute distress. HEENT: [place] AT, moist mucus membranes.  Trachea midline, no masses. Cardiovascular: No clubbing, cyanosis, or edema. Respiratory: Normal respiratory effort, no increased work of breathing.    Urinalysis Microscopy >30 WBC   Pertinent Imaging: KUB and ultrasound images were personally reviewed and interpreted  Abdomen 1 view (KUB)  Narrative CLINICAL DATA:  Kidney stone.  Mid lower abdominal pain.  EXAM: ABDOMEN - 1 VIEW  COMPARISON:  Radiograph 01/06/2021 CT 08/03/2019  FINDINGS: 6 mm stone projects over the lower left renal shadow. Punctate calcification in the upper  left kidney on prior CT is not seen on the current exam. No evidence of right renal calculi. No ureteral stones. Left pelvic calcifications consistent with phleboliths. Large volume of stool throughout the colon. No small bowel dilatation or evidence of obstruction. No acute osseous abnormalities are seen.  IMPRESSION: 1. A 6 mm stone projects over the lower left renal shadow. No visualized ureteral calculi. 2. Large volume of colonic stool.   Electronically Signed By: Keith Rake M.D. On: 07/07/2021 19:26   Ultrasound renal complete  Narrative CLINICAL DATA:  Nephrolithiasis.  EXAM: RENAL / URINARY TRACT ULTRASOUND COMPLETE  COMPARISON:  Same-day abdominal radiograph. Abdominopelvic CT 08/03/2019  FINDINGS: Right Kidney:  Renal measurements: 12.7 x 5.7 x 6.5 cm = volume: 247 mL. Normal parenchymal echogenicity. No hydronephrosis. No visualized stone or focal lesion.  Left Kidney:  Renal measurements: 11.1 x 4.7 x 4.8 cm = volume: 130  mL. Shadowing 8 mm stone in the lower left kidney. Mild upper pole cortical scarring better appreciated on prior CT. No hydronephrosis.  Bladder:  Appears normal for degree of bladder distention. Both ureteral jets are seen.  Other:  Hepatic steatosis incidentally noted.  IMPRESSION: 1. Nonobstructing left renal stone.  No hydronephrosis. 2. Normal sonographic appearance of the right kidney and urinary bladder.   Electronically Signed By: Keith Rake M.D. On: 07/07/2021 19:27   Assessment & Plan:    1. Nephrolithiasis Stable, nonobstructing left renal calculus She has chronic lower abdominal discomfort most likely GI in etiology discussed with her this is unlikely related to her stone and surveillance recommended 1 year follow-up with KUB and call as needed for any increased pain  2.  UTI UA today with >30 WBCs Urine culture ordered Rx Septra DS sent to pharmacy pending culture result   Return for 1  year follow-up with KUB/ds.  [name], [place] [address], [place],  [address] [phone]

## 2021-07-10 ENCOUNTER — Encounter: Payer: Self-pay | Admitting: Urology

## 2021-07-12 LAB — CULTURE, URINE COMPREHENSIVE

## 2021-07-13 ENCOUNTER — Telehealth: Payer: Self-pay | Admitting: Family Medicine

## 2021-07-13 NOTE — Telephone Encounter (Signed)
-----   Message from [name], MD sent at 07/12/2021  9:55 PM EDT ----- Urine culture was positive and sensitive to prescribed antibiotic

## 2021-07-13 NOTE — Telephone Encounter (Signed)
Patients caregiver notified and voiced understanding.

## 2021-07-19 DIAGNOSIS — J4540 Moderate persistent asthma, uncomplicated: Secondary | ICD-10-CM | POA: Diagnosis not present

## 2021-07-19 DIAGNOSIS — G479 Sleep disorder, unspecified: Secondary | ICD-10-CM | POA: Diagnosis not present

## 2021-07-22 DIAGNOSIS — D519 Vitamin B12 deficiency anemia, unspecified: Secondary | ICD-10-CM | POA: Diagnosis not present

## 2021-07-22 DIAGNOSIS — Z3042 Encounter for surveillance of injectable contraceptive: Secondary | ICD-10-CM | POA: Diagnosis not present

## 2021-08-03 DIAGNOSIS — J4540 Moderate persistent asthma, uncomplicated: Secondary | ICD-10-CM | POA: Diagnosis not present

## 2021-08-05 DIAGNOSIS — I10 Essential (primary) hypertension: Secondary | ICD-10-CM | POA: Diagnosis not present

## 2021-08-05 DIAGNOSIS — D519 Vitamin B12 deficiency anemia, unspecified: Secondary | ICD-10-CM | POA: Diagnosis not present

## 2021-08-05 DIAGNOSIS — E119 Type 2 diabetes mellitus without complications: Secondary | ICD-10-CM | POA: Diagnosis not present

## 2021-08-05 DIAGNOSIS — E785 Hyperlipidemia, unspecified: Secondary | ICD-10-CM | POA: Diagnosis not present

## 2021-08-05 DIAGNOSIS — E538 Deficiency of other specified B group vitamins: Secondary | ICD-10-CM | POA: Diagnosis not present

## 2021-08-09 DIAGNOSIS — I10 Essential (primary) hypertension: Secondary | ICD-10-CM | POA: Diagnosis not present

## 2021-08-09 DIAGNOSIS — D519 Vitamin B12 deficiency anemia, unspecified: Secondary | ICD-10-CM | POA: Diagnosis not present

## 2021-08-09 DIAGNOSIS — F411 Generalized anxiety disorder: Secondary | ICD-10-CM | POA: Diagnosis not present

## 2021-08-09 DIAGNOSIS — K219 Gastro-esophageal reflux disease without esophagitis: Secondary | ICD-10-CM | POA: Diagnosis not present

## 2021-08-09 DIAGNOSIS — E119 Type 2 diabetes mellitus without complications: Secondary | ICD-10-CM | POA: Diagnosis not present

## 2021-08-09 DIAGNOSIS — E785 Hyperlipidemia, unspecified: Secondary | ICD-10-CM | POA: Diagnosis not present

## 2021-08-17 DIAGNOSIS — J4540 Moderate persistent asthma, uncomplicated: Secondary | ICD-10-CM | POA: Diagnosis not present

## 2021-08-19 ENCOUNTER — Ambulatory Visit: Payer: Medicare HMO | Admitting: *Deleted

## 2021-08-23 DIAGNOSIS — D519 Vitamin B12 deficiency anemia, unspecified: Secondary | ICD-10-CM | POA: Diagnosis not present

## 2021-08-26 ENCOUNTER — Encounter: Payer: Medicare HMO | Attending: Nurse Practitioner | Admitting: *Deleted

## 2021-08-26 ENCOUNTER — Other Ambulatory Visit: Payer: Self-pay

## 2021-08-26 ENCOUNTER — Encounter: Payer: Self-pay | Admitting: *Deleted

## 2021-08-26 VITALS — BP 110/78 | Ht 62.0 in | Wt 162.2 lb

## 2021-08-26 DIAGNOSIS — Z713 Dietary counseling and surveillance: Secondary | ICD-10-CM | POA: Insufficient documentation

## 2021-08-26 DIAGNOSIS — E785 Hyperlipidemia, unspecified: Secondary | ICD-10-CM | POA: Diagnosis not present

## 2021-08-26 DIAGNOSIS — K219 Gastro-esophageal reflux disease without esophagitis: Secondary | ICD-10-CM | POA: Insufficient documentation

## 2021-08-26 DIAGNOSIS — I10 Essential (primary) hypertension: Secondary | ICD-10-CM | POA: Diagnosis not present

## 2021-08-26 DIAGNOSIS — E119 Type 2 diabetes mellitus without complications: Secondary | ICD-10-CM | POA: Insufficient documentation

## 2021-08-26 DIAGNOSIS — E1165 Type 2 diabetes mellitus with hyperglycemia: Secondary | ICD-10-CM

## 2021-08-26 NOTE — Progress Notes (Signed)
Diabetes Self-Management Education  Visit Type: First/Initial  Appt. Start Time: 1015 Appt. End Time: 1941  08/26/2021  [name], identified by name and date of birth, is a 55 y.o. female with a diagnosis of Diabetes: Type 2.   ASSESSMENT  Blood pressure 110/78, height 5\' 2"  (1.575 m), weight 162 lb 3.2 oz (73.6 kg). Body mass index is 29.67 kg/m.   Diabetes Self-Management Education - 08/26/21 1152       Visit Information   Visit Type First/Initial      Initial Visit   Diabetes Type Type 2    Are you currently following a meal plan? Yes    What type of meal plan do you follow? "salads sometimes, decreasebread"    Are you taking your medications as prescribed? Yes   Pt didn't bring list and is not able to recall. She receives her meds from outside pharmacy in bubble packs.   Date Diagnosed "years"      Health Coping   How would you rate your overall health? Good      Psychosocial Assessment   Patient Belief/Attitude about Diabetes Other (comment)   "feel tired and feel bad"   Self-care barriers Low literacy;Other (comment)    Self-management support Doctor's office;Friends   care givers for transportation   Patient Concerns Nutrition/Meal planning;Healthy Lifestyle;Glycemic Control    Special Needs Simplified materials    Preferred Learning Style Auditory    Learning Readiness Ready    How often do you need to have someone help you when you read instructions, pamphlets, or other written materials from your doctor or pharmacy? 5 - Always   Cargeiver didn't stay. This nurse completed her paperwork. Asked one of her caregivers Anne Ng) to have someone accompany patient to next appointment.   What is the last grade level you completed in school? 12th      Pre-Education Assessment   Patient understands the diabetes disease and treatment process. Needs Instruction    Patient understands incorporating nutritional management into lifestyle. Needs Instruction    Patient  undertands incorporating physical activity into lifestyle. Needs Instruction    Patient understands using medications safely. Needs Instruction    Patient understands monitoring blood glucose, interpreting and using results Needs Review    Patient understands prevention, detection, and treatment of acute complications. Needs Instruction    Patient understands prevention, detection, and treatment of chronic complications. Needs Instruction    Patient understands how to develop strategies to address psychosocial issues. Needs Instruction    Patient understands how to develop strategies to promote health/change behavior. Needs Instruction      Complications   Last HgB A1C per patient/outside source 10 %   08/2021 per PCP  note   How often do you check your blood sugar? 1-2 times/day    Fasting Blood glucose range (mg/dL) >200   Pt reports FBG's in 200's mg/dL with 224 mg/dL today. She reports sometimes 300's mg/dL.   Postprandial Blood glucose range (mg/dL) --   Pt reports checking her blood sugars before supper with reading of 188 - 200's mg/dL and sometimes in the 300's mg/dL.   Have you had a dilated eye exam in the past 12 months? No    Have you had a dental exam in the past 12 months? No    Are you checking your feet? Yes    How many days per week are you checking your feet? 2      Dietary Intake   Breakfast boiled egg,  sausage; cereal and milk    Snack (morning) reports 1-2 snacks/day - chips, popcorn, crackers    Lunch hamburger, baked potato; fried chicken, green beans; salad (lettuce, bacon, grilled chicken, cheese, cuccumbers)    Dinner chicken, beef, pork, fish; potatoes, corn, pinto beans, rice, pasta, salads    Beverage(s) water, juice, sometimes sugar sweetened tea, ginger-ale, diet Dr Malachi Bonds      Exercise   Exercise Type ADL's      Patient Education   Previous Diabetes Education Yes (please comment)   pt can't remember   Disease state  Definition of diabetes, type 1 and 2,  and the diagnosis of diabetes    Nutrition management  Role of diet in the treatment of diabetes and the relationship between the three main macronutrients and blood glucose level;Food label reading, portion sizes and measuring food.    Physical activity and exercise  Role of exercise on diabetes management, blood pressure control and cardiac health.    Medications Reviewed patients medication for diabetes, action, purpose, timing of dose and side effects.    Monitoring Purpose and frequency of SMBG.;Taught/discussed recording of test results and interpretation of SMBG.    Chronic complications Relationship between chronic complications and blood glucose control    Psychosocial adjustment Identified and addressed patients feelings and concerns about diabetes      Individualized Goals (developed by patient)   Reducing Risk Other (comment)   improve blood sugars, lead a healthy lifestyle     Outcomes   Expected Outcomes Demonstrated interest in learning. Expect positive outcomes    Future DMSE 2 months        Individualized Plan for Diabetes Self-Management Training:   Learning Objective:  Patient will have a greater understanding of diabetes self-management. Patient education plan is to attend individual and/or group sessions per assessed needs and concerns.   Plan:   Patient Instructions  Check blood sugars 2 x day before breakfast and before supper every day Bring blood sugar records and/meter to the next appointment  Exercise:  Begin  for    5-10  minutes   3  days a week and gradually increase to daily  Eat 3 meals day,   1-2  snacks a day Space meals 4-6 hours apart Avoid sugar sweetened drinks (juices, soda, tea) - use artificial sweetener if needed  Make an eye doctor appointment  Bring a list of your medications to next visit  Return for appointment: Friday October 21, 2021 at 11:00 am with [facility] (dietitian)  Expected Outcomes:  Demonstrated interest in learning. Expect  positive outcomes  Education material provided:  General Meal Planning Guidelines Simple Meal Plan Plate Method (ADA)  If problems or questions, patient to contact team via:   Johny Drilling, RN, [place] [phone]  Future DSME appointment: 2 months October 21, 2021 with the dietitian

## 2021-08-26 NOTE — Patient Instructions (Addendum)
Check blood sugars 2 x day before breakfast and before supper every day Bring blood sugar records and/meter to the next appointment  Exercise:  Begin  for    5-10  minutes   3  days a week and gradually increase to daily  Eat 3 meals day,   1-2  snacks a day Space meals 4-6 hours apart Avoid sugar sweetened drinks (juices, soda, tea) - use artificial sweetener if needed  Make an eye doctor appointment  Bring a list of your medications to next visit  Return for appointment: Friday October 21, 2021 at 11:00 am with [facility] (dietitian)

## 2021-08-29 DIAGNOSIS — L600 Ingrowing nail: Secondary | ICD-10-CM | POA: Diagnosis not present

## 2021-08-29 DIAGNOSIS — M79675 Pain in left toe(s): Secondary | ICD-10-CM | POA: Diagnosis not present

## 2021-08-29 DIAGNOSIS — M25473 Effusion, unspecified ankle: Secondary | ICD-10-CM | POA: Diagnosis not present

## 2021-08-29 DIAGNOSIS — M79674 Pain in right toe(s): Secondary | ICD-10-CM | POA: Diagnosis not present

## 2021-08-29 DIAGNOSIS — B351 Tinea unguium: Secondary | ICD-10-CM | POA: Diagnosis not present

## 2021-08-30 ENCOUNTER — Other Ambulatory Visit: Payer: Self-pay | Admitting: Nurse Practitioner

## 2021-08-30 DIAGNOSIS — H53149 Visual discomfort, unspecified: Secondary | ICD-10-CM | POA: Diagnosis not present

## 2021-08-30 DIAGNOSIS — R519 Headache, unspecified: Secondary | ICD-10-CM | POA: Diagnosis not present

## 2021-08-30 DIAGNOSIS — G479 Sleep disorder, unspecified: Secondary | ICD-10-CM | POA: Diagnosis not present

## 2021-08-30 DIAGNOSIS — Z1231 Encounter for screening mammogram for malignant neoplasm of breast: Secondary | ICD-10-CM

## 2021-08-31 DIAGNOSIS — J4540 Moderate persistent asthma, uncomplicated: Secondary | ICD-10-CM | POA: Diagnosis not present

## 2021-09-06 DIAGNOSIS — E785 Hyperlipidemia, unspecified: Secondary | ICD-10-CM | POA: Diagnosis not present

## 2021-09-06 DIAGNOSIS — E1159 Type 2 diabetes mellitus with other circulatory complications: Secondary | ICD-10-CM | POA: Diagnosis not present

## 2021-09-06 DIAGNOSIS — E1165 Type 2 diabetes mellitus with hyperglycemia: Secondary | ICD-10-CM | POA: Diagnosis not present

## 2021-09-06 DIAGNOSIS — I152 Hypertension secondary to endocrine disorders: Secondary | ICD-10-CM | POA: Diagnosis not present

## 2021-09-06 DIAGNOSIS — E1169 Type 2 diabetes mellitus with other specified complication: Secondary | ICD-10-CM | POA: Diagnosis not present

## 2021-09-13 DIAGNOSIS — R1314 Dysphagia, pharyngoesophageal phase: Secondary | ICD-10-CM | POA: Diagnosis not present

## 2021-09-13 DIAGNOSIS — R140 Abdominal distension (gaseous): Secondary | ICD-10-CM | POA: Diagnosis not present

## 2021-09-13 DIAGNOSIS — R4189 Other symptoms and signs involving cognitive functions and awareness: Secondary | ICD-10-CM | POA: Diagnosis not present

## 2021-09-13 DIAGNOSIS — K219 Gastro-esophageal reflux disease without esophagitis: Secondary | ICD-10-CM | POA: Diagnosis not present

## 2021-09-13 DIAGNOSIS — K581 Irritable bowel syndrome with constipation: Secondary | ICD-10-CM | POA: Diagnosis not present

## 2021-09-14 DIAGNOSIS — E1165 Type 2 diabetes mellitus with hyperglycemia: Secondary | ICD-10-CM | POA: Diagnosis not present

## 2021-09-14 DIAGNOSIS — J4540 Moderate persistent asthma, uncomplicated: Secondary | ICD-10-CM | POA: Diagnosis not present

## 2021-09-21 DIAGNOSIS — G4733 Obstructive sleep apnea (adult) (pediatric): Secondary | ICD-10-CM | POA: Diagnosis not present

## 2021-09-21 DIAGNOSIS — Z23 Encounter for immunization: Secondary | ICD-10-CM | POA: Diagnosis not present

## 2021-09-21 DIAGNOSIS — K219 Gastro-esophageal reflux disease without esophagitis: Secondary | ICD-10-CM | POA: Diagnosis not present

## 2021-09-21 DIAGNOSIS — E785 Hyperlipidemia, unspecified: Secondary | ICD-10-CM | POA: Diagnosis not present

## 2021-09-21 DIAGNOSIS — D519 Vitamin B12 deficiency anemia, unspecified: Secondary | ICD-10-CM | POA: Diagnosis not present

## 2021-09-21 DIAGNOSIS — I10 Essential (primary) hypertension: Secondary | ICD-10-CM | POA: Diagnosis not present

## 2021-09-21 DIAGNOSIS — E119 Type 2 diabetes mellitus without complications: Secondary | ICD-10-CM | POA: Diagnosis not present

## 2021-09-28 DIAGNOSIS — J4540 Moderate persistent asthma, uncomplicated: Secondary | ICD-10-CM | POA: Diagnosis not present

## 2021-09-29 DIAGNOSIS — Z20822 Contact with and (suspected) exposure to covid-19: Secondary | ICD-10-CM | POA: Diagnosis not present

## 2021-10-07 ENCOUNTER — Ambulatory Visit
Admission: RE | Admit: 2021-10-07 | Discharge: 2021-10-07 | Disposition: A | Discharge status: Home / Self Care | Payer: Medicare HMO | Source: Ambulatory Visit | Attending: Nurse Practitioner | Admitting: Nurse Practitioner

## 2021-10-07 ENCOUNTER — Other Ambulatory Visit: Payer: Self-pay

## 2021-10-07 DIAGNOSIS — Z1231 Encounter for screening mammogram for malignant neoplasm of breast: Secondary | ICD-10-CM | POA: Diagnosis not present

## 2021-10-12 DIAGNOSIS — J4540 Moderate persistent asthma, uncomplicated: Secondary | ICD-10-CM | POA: Diagnosis not present

## 2021-10-18 DIAGNOSIS — J4540 Moderate persistent asthma, uncomplicated: Secondary | ICD-10-CM | POA: Diagnosis not present

## 2021-10-18 DIAGNOSIS — G4733 Obstructive sleep apnea (adult) (pediatric): Secondary | ICD-10-CM | POA: Diagnosis not present

## 2021-10-18 DIAGNOSIS — Z01818 Encounter for other preprocedural examination: Secondary | ICD-10-CM | POA: Diagnosis not present

## 2021-10-21 ENCOUNTER — Encounter: Payer: Medicare HMO | Attending: Nurse Practitioner | Admitting: Dietician

## 2021-10-21 ENCOUNTER — Encounter: Payer: Self-pay | Admitting: Dietician

## 2021-10-21 ENCOUNTER — Other Ambulatory Visit: Payer: Self-pay

## 2021-10-21 VITALS — Ht 62.0 in | Wt 156.7 lb

## 2021-10-21 DIAGNOSIS — E1165 Type 2 diabetes mellitus with hyperglycemia: Secondary | ICD-10-CM | POA: Diagnosis not present

## 2021-10-21 NOTE — Progress Notes (Signed)
Diabetes Self-Management Education  Visit Type:  Follow-up  Appt. Start Time: 1115 Appt. End Time: 4132  10/21/2021  Ms. Leda Gauze, identified by name and date of birth, is a 55 y.o. female with a diagnosis of Diabetes: Type 2  .   ASSESSMENT  Height 5\' 2"  (1.575 m), weight 156 lb 11.2 oz (71.1 kg). Body mass index is 28.66 kg/m.    Diabetes Self-Management Education - 44/01/02 7253       Complications   How often do you check your blood sugar? 1-2 times/day    Fasting Blood glucose range (mg/dL) 130-179   130s - 150s   Postprandial Blood glucose range (mg/dL) 180-200;130-179   140s - 180   Have you had a dilated eye exam in the past 12 months? No    Have you had a dental exam in the past 12 months? No    Are you checking your feet? Yes    How many days per week are you checking your feet? 2      Dietary Intake   Breakfast rice krispies; grits; 2 slices toast with cheese    Lunch grilled chicken/ steak/ pork chop + baked potato/ sweet potato + green beans/ salad/ broccoli with cheese; likes ramen noodles    Dinner 11/17 2pcs pizza    Beverage(s) diet peach tea      Exercise   Exercise Type ADL's      Patient Education   Nutrition management  Role of diet in the treatment of diabetes and the relationship between the three main macronutrients and blood glucose level;Food label reading, portion sizes and measuring food.;Meal timing in regards to the patients' current diabetes medication.;Meal options for control of blood glucose level and chronic complications.;Other (comment)   basic meal planning using personalized menus   Monitoring Taught/discussed recording of test results and interpretation of SMBG.    Acute complications Taught treatment of hypoglycemia - the 15 rule.      Post-Education Assessment   Patient understands the diabetes disease and treatment process. Demonstrates understanding / competency    Patient understands incorporating nutritional management into  lifestyle. Demonstrates understanding / competency    Patient undertands incorporating physical activity into lifestyle. Needs Review    Patient understands using medications safely. Demonstrates understanding / competency    Patient understands monitoring blood glucose, interpreting and using results Demonstrates understanding / competency    Patient understands prevention, detection, and treatment of acute complications. Needs Review    Patient understands prevention, detection, and treatment of chronic complications. Needs Review    Patient understands how to develop strategies to address psychosocial issues. Demonstrates understanding / competency    Patient understands how to develop strategies to promote health/change behavior. Demonstrates understanding / competency      Outcomes   Program Status Completed             Learning Objective:  Patient will have a greater understanding of diabetes self-management. Patient education plan is to attend individual and/or group sessions per assessed needs and concerns. Additional Notes: Patient voices some understanding of basic food groups and balanced meals. Reviewed meal planning using printed menus and developed individualized menus based on patient's preferences.  Plan:  Patient will continue to work to consume nutritionally balanced meals for healthy and consistent BGs    Expected Outcomes:  Demonstrated interest in learning. Expect positive outcomes  Education material provided: Quick and Simple Meal Ideas; Smart Snacking  If problems or questions, patient to contact team via:  Phone

## 2021-10-25 DIAGNOSIS — D519 Vitamin B12 deficiency anemia, unspecified: Secondary | ICD-10-CM | POA: Diagnosis not present

## 2021-10-25 DIAGNOSIS — Z3042 Encounter for surveillance of injectable contraceptive: Secondary | ICD-10-CM | POA: Diagnosis not present

## 2021-10-26 DIAGNOSIS — J4540 Moderate persistent asthma, uncomplicated: Secondary | ICD-10-CM | POA: Diagnosis not present

## 2021-11-09 DIAGNOSIS — J4540 Moderate persistent asthma, uncomplicated: Secondary | ICD-10-CM | POA: Diagnosis not present

## 2021-11-22 DIAGNOSIS — J4540 Moderate persistent asthma, uncomplicated: Secondary | ICD-10-CM | POA: Diagnosis not present

## 2021-11-25 DIAGNOSIS — I10 Essential (primary) hypertension: Secondary | ICD-10-CM | POA: Diagnosis not present

## 2021-11-25 DIAGNOSIS — D519 Vitamin B12 deficiency anemia, unspecified: Secondary | ICD-10-CM | POA: Diagnosis not present

## 2021-11-25 DIAGNOSIS — E119 Type 2 diabetes mellitus without complications: Secondary | ICD-10-CM | POA: Diagnosis not present

## 2021-11-25 DIAGNOSIS — E785 Hyperlipidemia, unspecified: Secondary | ICD-10-CM | POA: Diagnosis not present

## 2021-11-29 DIAGNOSIS — M1711 Unilateral primary osteoarthritis, right knee: Secondary | ICD-10-CM | POA: Diagnosis not present

## 2021-11-29 DIAGNOSIS — E785 Hyperlipidemia, unspecified: Secondary | ICD-10-CM | POA: Diagnosis not present

## 2021-11-29 DIAGNOSIS — J309 Allergic rhinitis, unspecified: Secondary | ICD-10-CM | POA: Diagnosis not present

## 2021-11-29 DIAGNOSIS — I10 Essential (primary) hypertension: Secondary | ICD-10-CM | POA: Diagnosis not present

## 2021-11-29 DIAGNOSIS — E119 Type 2 diabetes mellitus without complications: Secondary | ICD-10-CM | POA: Diagnosis not present

## 2021-12-12 DIAGNOSIS — J4540 Moderate persistent asthma, uncomplicated: Secondary | ICD-10-CM | POA: Diagnosis not present

## 2021-12-26 DIAGNOSIS — J4540 Moderate persistent asthma, uncomplicated: Secondary | ICD-10-CM | POA: Diagnosis not present

## 2021-12-27 DIAGNOSIS — D519 Vitamin B12 deficiency anemia, unspecified: Secondary | ICD-10-CM | POA: Diagnosis not present

## 2021-12-27 DIAGNOSIS — Z3042 Encounter for surveillance of injectable contraceptive: Secondary | ICD-10-CM | POA: Diagnosis not present

## 2022-01-09 DIAGNOSIS — J4540 Moderate persistent asthma, uncomplicated: Secondary | ICD-10-CM | POA: Diagnosis not present

## 2022-01-11 DIAGNOSIS — I152 Hypertension secondary to endocrine disorders: Secondary | ICD-10-CM | POA: Diagnosis not present

## 2022-01-11 DIAGNOSIS — E538 Deficiency of other specified B group vitamins: Secondary | ICD-10-CM | POA: Diagnosis not present

## 2022-01-11 DIAGNOSIS — E1165 Type 2 diabetes mellitus with hyperglycemia: Secondary | ICD-10-CM | POA: Diagnosis not present

## 2022-01-11 DIAGNOSIS — E1169 Type 2 diabetes mellitus with other specified complication: Secondary | ICD-10-CM | POA: Diagnosis not present

## 2022-01-11 DIAGNOSIS — E785 Hyperlipidemia, unspecified: Secondary | ICD-10-CM | POA: Diagnosis not present

## 2022-01-11 DIAGNOSIS — E1159 Type 2 diabetes mellitus with other circulatory complications: Secondary | ICD-10-CM | POA: Diagnosis not present

## 2022-01-11 DIAGNOSIS — Z79899 Other long term (current) drug therapy: Secondary | ICD-10-CM | POA: Diagnosis not present

## 2022-01-17 DIAGNOSIS — E119 Type 2 diabetes mellitus without complications: Secondary | ICD-10-CM | POA: Diagnosis not present

## 2022-01-23 DIAGNOSIS — D519 Vitamin B12 deficiency anemia, unspecified: Secondary | ICD-10-CM | POA: Diagnosis not present

## 2022-01-23 DIAGNOSIS — J4540 Moderate persistent asthma, uncomplicated: Secondary | ICD-10-CM | POA: Diagnosis not present

## 2022-01-31 DIAGNOSIS — F410 Panic disorder [episodic paroxysmal anxiety] without agoraphobia: Secondary | ICD-10-CM | POA: Diagnosis not present

## 2022-01-31 DIAGNOSIS — R4183 Borderline intellectual functioning: Secondary | ICD-10-CM | POA: Diagnosis not present

## 2022-01-31 DIAGNOSIS — Z634 Disappearance and death of family member: Secondary | ICD-10-CM | POA: Diagnosis not present

## 2022-01-31 DIAGNOSIS — F339 Major depressive disorder, recurrent, unspecified: Secondary | ICD-10-CM | POA: Diagnosis not present

## 2022-01-31 DIAGNOSIS — F3341 Major depressive disorder, recurrent, in partial remission: Secondary | ICD-10-CM | POA: Diagnosis not present

## 2022-01-31 DIAGNOSIS — F331 Major depressive disorder, recurrent, moderate: Secondary | ICD-10-CM | POA: Diagnosis not present

## 2022-01-31 DIAGNOSIS — F79 Unspecified intellectual disabilities: Secondary | ICD-10-CM | POA: Diagnosis not present

## 2022-01-31 DIAGNOSIS — Z79899 Other long term (current) drug therapy: Secondary | ICD-10-CM | POA: Diagnosis not present

## 2022-02-06 DIAGNOSIS — J4540 Moderate persistent asthma, uncomplicated: Secondary | ICD-10-CM | POA: Diagnosis not present

## 2022-02-07 DIAGNOSIS — F339 Major depressive disorder, recurrent, unspecified: Secondary | ICD-10-CM | POA: Diagnosis not present

## 2022-02-07 DIAGNOSIS — F79 Unspecified intellectual disabilities: Secondary | ICD-10-CM | POA: Diagnosis not present

## 2022-02-07 DIAGNOSIS — F410 Panic disorder [episodic paroxysmal anxiety] without agoraphobia: Secondary | ICD-10-CM | POA: Diagnosis not present

## 2022-02-20 DIAGNOSIS — J4540 Moderate persistent asthma, uncomplicated: Secondary | ICD-10-CM | POA: Diagnosis not present

## 2022-02-21 DIAGNOSIS — D519 Vitamin B12 deficiency anemia, unspecified: Secondary | ICD-10-CM | POA: Diagnosis not present

## 2022-02-21 DIAGNOSIS — I10 Essential (primary) hypertension: Secondary | ICD-10-CM | POA: Diagnosis not present

## 2022-02-22 DIAGNOSIS — H6123 Impacted cerumen, bilateral: Secondary | ICD-10-CM | POA: Diagnosis not present

## 2022-02-22 DIAGNOSIS — J4540 Moderate persistent asthma, uncomplicated: Secondary | ICD-10-CM | POA: Diagnosis not present

## 2022-03-06 DIAGNOSIS — J4540 Moderate persistent asthma, uncomplicated: Secondary | ICD-10-CM | POA: Diagnosis not present

## 2022-03-14 DIAGNOSIS — R4189 Other symptoms and signs involving cognitive functions and awareness: Secondary | ICD-10-CM | POA: Diagnosis not present

## 2022-03-14 DIAGNOSIS — K219 Gastro-esophageal reflux disease without esophagitis: Secondary | ICD-10-CM | POA: Diagnosis not present

## 2022-03-14 DIAGNOSIS — R140 Abdominal distension (gaseous): Secondary | ICD-10-CM | POA: Diagnosis not present

## 2022-03-14 DIAGNOSIS — K581 Irritable bowel syndrome with constipation: Secondary | ICD-10-CM | POA: Diagnosis not present

## 2022-03-14 DIAGNOSIS — R1314 Dysphagia, pharyngoesophageal phase: Secondary | ICD-10-CM | POA: Diagnosis not present

## 2022-03-20 DIAGNOSIS — J4540 Moderate persistent asthma, uncomplicated: Secondary | ICD-10-CM | POA: Diagnosis not present

## 2022-03-21 DIAGNOSIS — R0602 Shortness of breath: Secondary | ICD-10-CM | POA: Diagnosis not present

## 2022-03-21 DIAGNOSIS — I10 Essential (primary) hypertension: Secondary | ICD-10-CM | POA: Diagnosis not present

## 2022-03-21 DIAGNOSIS — K219 Gastro-esophageal reflux disease without esophagitis: Secondary | ICD-10-CM | POA: Diagnosis not present

## 2022-03-21 DIAGNOSIS — R079 Chest pain, unspecified: Secondary | ICD-10-CM | POA: Diagnosis not present

## 2022-03-21 DIAGNOSIS — F319 Bipolar disorder, unspecified: Secondary | ICD-10-CM | POA: Diagnosis not present

## 2022-03-21 DIAGNOSIS — G4733 Obstructive sleep apnea (adult) (pediatric): Secondary | ICD-10-CM | POA: Diagnosis not present

## 2022-03-21 DIAGNOSIS — E785 Hyperlipidemia, unspecified: Secondary | ICD-10-CM | POA: Diagnosis not present

## 2022-04-03 DIAGNOSIS — J4540 Moderate persistent asthma, uncomplicated: Secondary | ICD-10-CM | POA: Diagnosis not present

## 2022-04-04 DIAGNOSIS — R079 Chest pain, unspecified: Secondary | ICD-10-CM | POA: Diagnosis not present

## 2022-04-07 DIAGNOSIS — K219 Gastro-esophageal reflux disease without esophagitis: Secondary | ICD-10-CM | POA: Diagnosis not present

## 2022-04-07 DIAGNOSIS — E781 Pure hyperglyceridemia: Secondary | ICD-10-CM | POA: Diagnosis not present

## 2022-04-07 DIAGNOSIS — E785 Hyperlipidemia, unspecified: Secondary | ICD-10-CM | POA: Diagnosis not present

## 2022-04-07 DIAGNOSIS — E119 Type 2 diabetes mellitus without complications: Secondary | ICD-10-CM | POA: Diagnosis not present

## 2022-04-07 DIAGNOSIS — D519 Vitamin B12 deficiency anemia, unspecified: Secondary | ICD-10-CM | POA: Diagnosis not present

## 2022-04-07 DIAGNOSIS — I10 Essential (primary) hypertension: Secondary | ICD-10-CM | POA: Diagnosis not present

## 2022-04-07 DIAGNOSIS — E669 Obesity, unspecified: Secondary | ICD-10-CM | POA: Diagnosis not present

## 2022-04-07 DIAGNOSIS — G4733 Obstructive sleep apnea (adult) (pediatric): Secondary | ICD-10-CM | POA: Diagnosis not present

## 2022-04-07 DIAGNOSIS — R079 Chest pain, unspecified: Secondary | ICD-10-CM | POA: Diagnosis not present

## 2022-04-14 DIAGNOSIS — E785 Hyperlipidemia, unspecified: Secondary | ICD-10-CM | POA: Diagnosis not present

## 2022-04-14 DIAGNOSIS — K219 Gastro-esophageal reflux disease without esophagitis: Secondary | ICD-10-CM | POA: Diagnosis not present

## 2022-04-14 DIAGNOSIS — R5383 Other fatigue: Secondary | ICD-10-CM | POA: Diagnosis not present

## 2022-04-14 DIAGNOSIS — Z0001 Encounter for general adult medical examination with abnormal findings: Secondary | ICD-10-CM | POA: Diagnosis not present

## 2022-04-14 DIAGNOSIS — D519 Vitamin B12 deficiency anemia, unspecified: Secondary | ICD-10-CM | POA: Diagnosis not present

## 2022-04-14 DIAGNOSIS — E1165 Type 2 diabetes mellitus with hyperglycemia: Secondary | ICD-10-CM | POA: Diagnosis not present

## 2022-04-14 DIAGNOSIS — E119 Type 2 diabetes mellitus without complications: Secondary | ICD-10-CM | POA: Diagnosis not present

## 2022-04-14 DIAGNOSIS — I10 Essential (primary) hypertension: Secondary | ICD-10-CM | POA: Diagnosis not present

## 2022-04-17 DIAGNOSIS — J4540 Moderate persistent asthma, uncomplicated: Secondary | ICD-10-CM | POA: Diagnosis not present

## 2022-04-19 DIAGNOSIS — D519 Vitamin B12 deficiency anemia, unspecified: Secondary | ICD-10-CM | POA: Diagnosis not present

## 2022-04-26 DIAGNOSIS — F79 Unspecified intellectual disabilities: Secondary | ICD-10-CM | POA: Diagnosis not present

## 2022-04-26 DIAGNOSIS — F339 Major depressive disorder, recurrent, unspecified: Secondary | ICD-10-CM | POA: Diagnosis not present

## 2022-04-26 DIAGNOSIS — Z79899 Other long term (current) drug therapy: Secondary | ICD-10-CM | POA: Diagnosis not present

## 2022-04-26 DIAGNOSIS — F331 Major depressive disorder, recurrent, moderate: Secondary | ICD-10-CM | POA: Diagnosis not present

## 2022-04-26 DIAGNOSIS — F3341 Major depressive disorder, recurrent, in partial remission: Secondary | ICD-10-CM | POA: Diagnosis not present

## 2022-04-26 DIAGNOSIS — R4183 Borderline intellectual functioning: Secondary | ICD-10-CM | POA: Diagnosis not present

## 2022-04-26 DIAGNOSIS — Z634 Disappearance and death of family member: Secondary | ICD-10-CM | POA: Diagnosis not present

## 2022-04-26 DIAGNOSIS — F410 Panic disorder [episodic paroxysmal anxiety] without agoraphobia: Secondary | ICD-10-CM | POA: Diagnosis not present

## 2022-05-08 DIAGNOSIS — J4540 Moderate persistent asthma, uncomplicated: Secondary | ICD-10-CM | POA: Diagnosis not present

## 2022-05-19 DIAGNOSIS — D519 Vitamin B12 deficiency anemia, unspecified: Secondary | ICD-10-CM | POA: Diagnosis not present

## 2022-05-23 DIAGNOSIS — E1159 Type 2 diabetes mellitus with other circulatory complications: Secondary | ICD-10-CM | POA: Diagnosis not present

## 2022-05-23 DIAGNOSIS — E1169 Type 2 diabetes mellitus with other specified complication: Secondary | ICD-10-CM | POA: Diagnosis not present

## 2022-05-23 DIAGNOSIS — E1165 Type 2 diabetes mellitus with hyperglycemia: Secondary | ICD-10-CM | POA: Diagnosis not present

## 2022-05-23 DIAGNOSIS — H6123 Impacted cerumen, bilateral: Secondary | ICD-10-CM | POA: Diagnosis not present

## 2022-05-23 DIAGNOSIS — J4540 Moderate persistent asthma, uncomplicated: Secondary | ICD-10-CM | POA: Diagnosis not present

## 2022-05-23 DIAGNOSIS — I152 Hypertension secondary to endocrine disorders: Secondary | ICD-10-CM | POA: Diagnosis not present

## 2022-05-23 DIAGNOSIS — E785 Hyperlipidemia, unspecified: Secondary | ICD-10-CM | POA: Diagnosis not present

## 2022-05-23 DIAGNOSIS — H902 Conductive hearing loss, unspecified: Secondary | ICD-10-CM | POA: Diagnosis not present

## 2022-05-30 DIAGNOSIS — J209 Acute bronchitis, unspecified: Secondary | ICD-10-CM | POA: Diagnosis not present

## 2022-06-13 DIAGNOSIS — M79674 Pain in right toe(s): Secondary | ICD-10-CM | POA: Diagnosis not present

## 2022-06-13 DIAGNOSIS — M79675 Pain in left toe(s): Secondary | ICD-10-CM | POA: Diagnosis not present

## 2022-06-13 DIAGNOSIS — B351 Tinea unguium: Secondary | ICD-10-CM | POA: Diagnosis not present

## 2022-06-13 DIAGNOSIS — L600 Ingrowing nail: Secondary | ICD-10-CM | POA: Diagnosis not present

## 2022-06-13 DIAGNOSIS — E119 Type 2 diabetes mellitus without complications: Secondary | ICD-10-CM | POA: Diagnosis not present

## 2022-06-14 DIAGNOSIS — J4540 Moderate persistent asthma, uncomplicated: Secondary | ICD-10-CM | POA: Diagnosis not present

## 2022-06-27 DIAGNOSIS — Z3042 Encounter for surveillance of injectable contraceptive: Secondary | ICD-10-CM | POA: Diagnosis not present

## 2022-06-27 DIAGNOSIS — J4540 Moderate persistent asthma, uncomplicated: Secondary | ICD-10-CM | POA: Diagnosis not present

## 2022-07-04 DIAGNOSIS — R079 Chest pain, unspecified: Secondary | ICD-10-CM | POA: Diagnosis not present

## 2022-07-04 DIAGNOSIS — E669 Obesity, unspecified: Secondary | ICD-10-CM | POA: Diagnosis not present

## 2022-07-04 DIAGNOSIS — G4733 Obstructive sleep apnea (adult) (pediatric): Secondary | ICD-10-CM | POA: Diagnosis not present

## 2022-07-04 DIAGNOSIS — K219 Gastro-esophageal reflux disease without esophagitis: Secondary | ICD-10-CM | POA: Diagnosis not present

## 2022-07-04 DIAGNOSIS — I10 Essential (primary) hypertension: Secondary | ICD-10-CM | POA: Diagnosis not present

## 2022-07-04 DIAGNOSIS — E785 Hyperlipidemia, unspecified: Secondary | ICD-10-CM | POA: Diagnosis not present

## 2022-07-07 ENCOUNTER — Ambulatory Visit
Admission: RE | Admit: 2022-07-07 | Discharge: 2022-07-07 | Disposition: A | Discharge status: Home / Self Care | Payer: Medicare HMO | Source: Ambulatory Visit | Attending: Urology | Admitting: Urology

## 2022-07-07 ENCOUNTER — Ambulatory Visit (INDEPENDENT_AMBULATORY_CARE_PROVIDER_SITE_OTHER): Payer: Medicare HMO | Admitting: Urology

## 2022-07-07 ENCOUNTER — Encounter: Payer: Self-pay | Admitting: Urology

## 2022-07-07 VITALS — BP 155/98 | HR 82 | Ht 62.0 in | Wt 146.0 lb

## 2022-07-07 DIAGNOSIS — N200 Calculus of kidney: Secondary | ICD-10-CM | POA: Diagnosis not present

## 2022-07-07 DIAGNOSIS — N3001 Acute cystitis with hematuria: Secondary | ICD-10-CM | POA: Insufficient documentation

## 2022-07-07 DIAGNOSIS — R109 Unspecified abdominal pain: Secondary | ICD-10-CM | POA: Diagnosis not present

## 2022-07-07 NOTE — Progress Notes (Signed)
07/07/2022 11:59 AM   Hannah Clarke October 09, 1966 [id number]  Referring provider: Danelle Berry, NP [address] [place],  [address] [address]  Chief Complaint  Patient presents with   Nephrolithiasis    Urologic history: 1.  Left nephrolithiasis Nonobstructing left renal calculus   HPI: 56 y.o. female presents for annual follow-up.  Doing well since last visit No bothersome LUTS Denies dysuria, gross hematuria Occasional mild abdominal discomfort No flank pain   PMH: Past Medical History:  Diagnosis Date   Anxiety    Chronic headaches    Depression    Dermatophytosis of nail    Diabetes mellitus without complication (HCC)    GERD (gastroesophageal reflux disease)    High cholesterol    Hypertension    Mental developmental delay    Onychia of toe    Panic attacks    Renal insufficiency     Surgical History: Past Surgical History:  Procedure Laterality Date   COLONOSCOPY WITH PROPOFOL N/A 10/09/2017   Procedure: COLONOSCOPY WITH PROPOFOL;  Surgeon: Toledo, Benay Pike, MD;  Location: [facility] ENDOSCOPY;  Service: Gastroenterology;  Laterality: N/A;   COLONOSCOPY WITH PROPOFOL N/A 02/06/2018   Procedure: COLONOSCOPY WITH PROPOFOL;  Surgeon: Toledo, Benay Pike, MD;  Location: [facility] ENDOSCOPY;  Service: Gastroenterology;  Laterality: N/A;   ESOPHAGOGASTRODUODENOSCOPY (EGD) WITH PROPOFOL N/A 10/09/2017   Procedure: ESOPHAGOGASTRODUODENOSCOPY (EGD) WITH PROPOFOL;  Surgeon: Toledo, Benay Pike, MD;  Location: [facility] ENDOSCOPY;  Service: Gastroenterology;  Laterality: N/A;   ESOPHAGOGASTRODUODENOSCOPY (EGD) WITH PROPOFOL N/A 07/31/2018   Procedure: ESOPHAGOGASTRODUODENOSCOPY (EGD) WITH PROPOFOL;  Surgeon: Toledo, Benay Pike, MD;  Location: [facility] ENDOSCOPY;  Service: Gastroenterology;  Laterality: N/A;   HAND SURGERY     KIDNEY STONE SURGERY      Home Medications:  Allergies as of 07/07/2022   No Known Allergies      Medication List        Accurate as of July 07, 2022  11:59 AM. If you have any questions, ask your nurse or doctor.          acetaminophen 325 MG tablet Commonly known as: TYLENOL Take 325 mg by mouth every 6 (six) hours as needed.   albuterol 108 (90 Base) MCG/ACT inhaler Commonly known as: VENTOLIN HFA Inhale 2 puffs into the lungs every 6 (six) hours as needed for wheezing or shortness of breath.   azelastine 0.1 % nasal spray Commonly known as: ASTELIN   benzonatate 100 MG capsule Commonly known as: TESSALON TAKE 1 CAPSULE BY MOUTH EVERY 8 HOURS ASNEEDED FOR COUGH   budesonide 0.5 MG/2ML nebulizer solution Commonly known as: PULMICORT Inhale into the lungs.   buPROPion 300 MG 24 hr tablet Commonly known as: WELLBUTRIN XL   busPIRone 30 MG tablet Commonly known as: BUSPAR   cetirizine 10 MG tablet Commonly known as: ZYRTEC Take 10 mg by mouth daily.   cholecalciferol 1000 units tablet Commonly known as: VITAMIN D Take 1,000 Units by mouth daily.   citalopram 10 MG tablet Commonly known as: CELEXA Take 10 mg by mouth daily.   dexlansoprazole 60 MG capsule Commonly known as: DEXILANT Take 60 mg by mouth daily.   Dulaglutide 0.75 MG/0.5ML Sopn Inject into the skin.   Dupilumab 300 MG/2ML Sopn Inject into the skin.   EPINEPHrine 0.3 mg/0.3 mL Soaj injection Commonly known as: EPI-PEN   famotidine 40 MG tablet Commonly known as: PEPCID   fluticasone 50 MCG/ACT nasal spray Commonly known as: FLONASE   gabapentin 400 MG capsule Commonly known  as: NEURONTIN   IBgard 90 MG Cpcr Generic drug: Peppermint Oil TAKE 2 CAPSULES BY MOUTH 2 TIMES A DAY BEFORE MEALS   ipratropium 0.06 % nasal spray Commonly known as: ATROVENT   lactulose 10 GM/15ML solution Commonly known as: CHRONULAC Take 30 mLs (20 g total) by mouth daily as needed for mild constipation.   Linzess 290 MCG Caps capsule Generic drug: linaclotide   LORazepam 1 MG tablet Commonly known as: ATIVAN Take 1 tablet (1 mg total) by mouth every  8 (eight) hours as needed for anxiety.   losartan 100 MG tablet Commonly known as: COZAAR   lubiprostone 24 MCG capsule Commonly known as: AMITIZA Take by mouth.   medroxyPROGESTERone 150 MG/ML injection Commonly known as: DEPO-PROVERA Inject 150 mg into the muscle every 3 (three) months.   metFORMIN 500 MG tablet Commonly known as: GLUCOPHAGE Take 2 tablets by mouth 2 (two) times daily.   Moderna COVID-19 Vaccine 100 MCG/0.5ML injection Generic drug: COVID-19 mRNA vaccine (Moderna)   montelukast 10 MG tablet Commonly known as: SINGULAIR Take 10 mg by mouth daily.   nortriptyline 50 MG capsule Commonly known as: PAMELOR   ondansetron 4 MG tablet Commonly known as: ZOFRAN Take 1 tablet by mouth every 8 (eight) hours as needed.   pantoprazole 40 MG tablet Commonly known as: PROTONIX Take 40 mg by mouth daily.   polyethylene glycol powder 17 GM/SCOOP powder Commonly known as: GLYCOLAX/MIRALAX MIX 34 GRAMS(2 CAPFULS) IN 8 OUNCES OF LIQUID OF CHOICE AND DRINK BY MOUTH DAILY IN THE MORNING   potassium chloride SA 20 MEQ tablet Commonly known as: KLOR-CON M Take 20 mEq by mouth daily.   rosuvastatin 10 MG tablet Commonly known as: CRESTOR   sennosides-docusate sodium 8.6-50 MG tablet Commonly known as: SENOKOT-S Take 1 tablet by mouth daily.   simethicone 125 MG chewable tablet Commonly known as: MYLICON TAKE 1 TABLET(CHEW) BY MOUTH EVERY 6 HOURS AS NEEDED FOR FLATULENCE   sucralfate 1 g tablet Commonly known as: CARAFATE Take 1 g by mouth 4 (four) times daily -  with meals and at bedtime.   Trelegy Ellipta 200-62.5-25 MCG/ACT Aepb Generic drug: Fluticasone-Umeclidin-Vilant Inhale into the lungs.   True Metrix Blood Glucose Test test strip Generic drug: glucose blood 1 each 2 (two) times daily.        Allergies: No Known Allergies  Family History: Family History  Problem Relation Age of Onset   Breast cancer Mother 45       pre pt.    Diabetes  Father    Diabetes Paternal Aunt    Diabetes Paternal Uncle    Diabetes Paternal Grandmother    Diabetes Paternal Grandfather     Social History:  reports that she has never smoked. She has never used smokeless tobacco. She reports that she does not currently use alcohol. She reports that she does not use drugs.   Physical Exam: BP (!) 155/98   Pulse 82   Ht '5\' 2"'$  (1.575 m)   Wt 146 lb (66.2 kg)   BMI 26.70 kg/m   Constitutional:  Alert, no acute distress. HEENT: Loving AT, moist mucus membranes.  Trachea midline, no masses. Cardiovascular: No clubbing, cyanosis, or edema. Respiratory: Normal respiratory effort, no increased work of breathing.   Pertinent Imaging:  Images of a KUB performed today were personally reviewed and interpreted.  Stable left lower pole renal calculus.  Large amount of stool in entire colon and rectum    Assessment & Plan:  1. Nephrolithiasis Stable, nonobstructing left renal calculus She has chronic lower abdominal discomfort most likely GI in etiology discussed with her this is unlikely related to her stone and surveillance recommended 1 year follow-up with KUB and call as needed for any increased pain   [name], MD  [facility] [address], [place], [place] [address] [phone]

## 2022-07-11 DIAGNOSIS — J4540 Moderate persistent asthma, uncomplicated: Secondary | ICD-10-CM | POA: Diagnosis not present

## 2022-07-12 ENCOUNTER — Other Ambulatory Visit: Payer: Self-pay

## 2022-07-12 NOTE — Patient Outreach (Signed)
[place] [facility]) Care Management  07/12/2022  TELENA PEYSER [birth year] [id number]   Telephone Screen    Outreach call to patient to introduce [facility] services and assess care needs as part of benefit of PCP office and insurance plan.No answer. RN CM left HIPAA compliant voicemail message along with contact info.     Plan: RN CM will make outreach attempt to patient within 4 business days.   Enzo Montgomery, RN,BSN,CCM [place] Management Telephonic Care Management Coordinator Direct Phone: [phone] Toll Free: [phone] Fax: [phone]

## 2022-07-14 ENCOUNTER — Other Ambulatory Visit: Payer: Self-pay

## 2022-07-14 NOTE — Patient Outreach (Signed)
[place] [facility]) Care Management  07/14/2022  Hannah Clarke [birth year] [id number]   Telephone Screen       Outreach call to patient to introduce [facility] services and assess care needs as part of benefit of PCP office and insurance plan. No answer at present.       Plan: RN CM will make outreach attempt to patient within 4 business days.   Enzo Montgomery, RN,BSN,CCM [place] Management Telephonic Care Management Coordinator Direct Phone: [phone] Toll Free: [phone] Fax: [phone]

## 2022-07-18 ENCOUNTER — Other Ambulatory Visit: Payer: Self-pay

## 2022-07-18 NOTE — Patient Outreach (Signed)
[place] [facility]) Care Management  07/18/2022  Hannah Clarke 03-24-1966 [id number]   Telephone Screen       Outreach call to patient to introduce [facility] services and assess care needs as part of benefit of PCP office and insurance plan. Spoke with caregiver who handles patient's affairs. No RN CM needs or concerns identified.  Main healthcare issue/concern today: None reported by caregiver. States patient doing well.   Health Maintenance/Care Gaps: -Last AWV: Not on file. Education provided to caregiver who makes appts for patient. She will follow up with MD office and schedule.      Enzo Montgomery, RN,BSN,CCM [place] Management Telephonic Care Management Coordinator Direct Phone: [phone] Toll Free: [phone] Fax: [phone]

## 2022-07-25 DIAGNOSIS — J4540 Moderate persistent asthma, uncomplicated: Secondary | ICD-10-CM | POA: Diagnosis not present

## 2022-07-28 DIAGNOSIS — E1165 Type 2 diabetes mellitus with hyperglycemia: Secondary | ICD-10-CM | POA: Diagnosis not present

## 2022-07-28 DIAGNOSIS — I10 Essential (primary) hypertension: Secondary | ICD-10-CM | POA: Diagnosis not present

## 2022-07-28 DIAGNOSIS — R5383 Other fatigue: Secondary | ICD-10-CM | POA: Diagnosis not present

## 2022-07-28 DIAGNOSIS — E785 Hyperlipidemia, unspecified: Secondary | ICD-10-CM | POA: Diagnosis not present

## 2022-07-28 DIAGNOSIS — D519 Vitamin B12 deficiency anemia, unspecified: Secondary | ICD-10-CM | POA: Diagnosis not present

## 2022-08-02 ENCOUNTER — Other Ambulatory Visit: Payer: Self-pay | Admitting: Nurse Practitioner

## 2022-08-02 DIAGNOSIS — E785 Hyperlipidemia, unspecified: Secondary | ICD-10-CM | POA: Diagnosis not present

## 2022-08-02 DIAGNOSIS — E119 Type 2 diabetes mellitus without complications: Secondary | ICD-10-CM | POA: Diagnosis not present

## 2022-08-02 DIAGNOSIS — J309 Allergic rhinitis, unspecified: Secondary | ICD-10-CM | POA: Diagnosis not present

## 2022-08-02 DIAGNOSIS — D519 Vitamin B12 deficiency anemia, unspecified: Secondary | ICD-10-CM | POA: Diagnosis not present

## 2022-08-02 DIAGNOSIS — I10 Essential (primary) hypertension: Secondary | ICD-10-CM | POA: Diagnosis not present

## 2022-08-02 DIAGNOSIS — Z1231 Encounter for screening mammogram for malignant neoplasm of breast: Secondary | ICD-10-CM

## 2022-08-08 DIAGNOSIS — J4540 Moderate persistent asthma, uncomplicated: Secondary | ICD-10-CM | POA: Diagnosis not present

## 2022-08-22 DIAGNOSIS — J4540 Moderate persistent asthma, uncomplicated: Secondary | ICD-10-CM | POA: Diagnosis not present

## 2022-08-29 DIAGNOSIS — G479 Sleep disorder, unspecified: Secondary | ICD-10-CM | POA: Diagnosis not present

## 2022-08-29 DIAGNOSIS — R519 Headache, unspecified: Secondary | ICD-10-CM | POA: Diagnosis not present

## 2022-08-29 DIAGNOSIS — H53149 Visual discomfort, unspecified: Secondary | ICD-10-CM | POA: Diagnosis not present

## 2022-09-05 DIAGNOSIS — J4540 Moderate persistent asthma, uncomplicated: Secondary | ICD-10-CM | POA: Diagnosis not present

## 2022-09-12 ENCOUNTER — Other Ambulatory Visit (HOSPITAL_COMMUNITY): Payer: Self-pay | Admitting: Gastroenterology

## 2022-09-12 ENCOUNTER — Other Ambulatory Visit: Payer: Self-pay | Admitting: Gastroenterology

## 2022-09-12 DIAGNOSIS — R140 Abdominal distension (gaseous): Secondary | ICD-10-CM | POA: Diagnosis not present

## 2022-09-12 DIAGNOSIS — K581 Irritable bowel syndrome with constipation: Secondary | ICD-10-CM | POA: Diagnosis not present

## 2022-09-12 DIAGNOSIS — R1314 Dysphagia, pharyngoesophageal phase: Secondary | ICD-10-CM

## 2022-09-12 DIAGNOSIS — M6208 Separation of muscle (nontraumatic), other site: Secondary | ICD-10-CM | POA: Diagnosis not present

## 2022-09-12 DIAGNOSIS — R4189 Other symptoms and signs involving cognitive functions and awareness: Secondary | ICD-10-CM | POA: Diagnosis not present

## 2022-09-12 DIAGNOSIS — K449 Diaphragmatic hernia without obstruction or gangrene: Secondary | ICD-10-CM | POA: Diagnosis not present

## 2022-09-12 DIAGNOSIS — J8283 Eosinophilic asthma: Secondary | ICD-10-CM | POA: Diagnosis not present

## 2022-09-12 DIAGNOSIS — K219 Gastro-esophageal reflux disease without esophagitis: Secondary | ICD-10-CM | POA: Diagnosis not present

## 2022-09-19 ENCOUNTER — Other Ambulatory Visit: Payer: Medicare HMO

## 2022-09-19 DIAGNOSIS — J4540 Moderate persistent asthma, uncomplicated: Secondary | ICD-10-CM | POA: Diagnosis not present

## 2022-09-20 ENCOUNTER — Ambulatory Visit
Admission: RE | Admit: 2022-09-20 | Discharge: 2022-09-20 | Disposition: A | Discharge status: Home / Self Care | Payer: Medicare HMO | Source: Ambulatory Visit | Attending: Gastroenterology | Admitting: Gastroenterology

## 2022-09-20 DIAGNOSIS — R1314 Dysphagia, pharyngoesophageal phase: Secondary | ICD-10-CM | POA: Diagnosis not present

## 2022-09-20 DIAGNOSIS — K449 Diaphragmatic hernia without obstruction or gangrene: Secondary | ICD-10-CM | POA: Diagnosis not present

## 2022-09-20 DIAGNOSIS — K219 Gastro-esophageal reflux disease without esophagitis: Secondary | ICD-10-CM | POA: Diagnosis not present

## 2022-09-20 DIAGNOSIS — R140 Abdominal distension (gaseous): Secondary | ICD-10-CM | POA: Insufficient documentation

## 2022-09-27 ENCOUNTER — Ambulatory Visit (INDEPENDENT_AMBULATORY_CARE_PROVIDER_SITE_OTHER): Payer: Medicare HMO | Admitting: Surgery

## 2022-09-27 ENCOUNTER — Encounter: Payer: Self-pay | Admitting: Surgery

## 2022-09-27 VITALS — BP 156/85 | HR 92 | Temp 99.2°F | Ht 62.0 in | Wt 146.8 lb

## 2022-09-27 DIAGNOSIS — R1310 Dysphagia, unspecified: Secondary | ICD-10-CM

## 2022-09-27 DIAGNOSIS — Z23 Encounter for immunization: Secondary | ICD-10-CM | POA: Diagnosis not present

## 2022-09-27 DIAGNOSIS — K449 Diaphragmatic hernia without obstruction or gangrene: Secondary | ICD-10-CM | POA: Diagnosis not present

## 2022-09-27 NOTE — Patient Instructions (Addendum)
We will have you follow up here in January to discuss surgery. We will send you a letter about this appointment.   Your CT is scheduled for 10/03/2022 at 11 am (arrive by 10:45 am) [facility] [address]. Nothing to eat or drink 4 hours prior.   Please go to [facility] to have your lab drawn before having the CT done. You do not need an appointment.    If you have any concerns or questions, please feel free to call our office.     We have spoken today about repairing your Hiatal Hernia. Plan to be in the hospital for 1-2 days if the minimally invasive surgery is completed without having to make a bigger incision. If the bigger incision is made, you will most likely need to be in the hospital 4-6 days. You will be on a soft diet and need to recover for 2 weeks following your surgery prior to doing any of your normal activities. At the 2 week mark, we will see you in the office and if you are doing ok we will advance your diet and activity level as you tolerate.  You will need to arrange to be out of work for approximately 1-2 weeks and then you may return with a lifting restriction for 4 more weeks. If you have FMLA or Disability paperwork that needs to be filled out, please have your company fax your paperwork to [phone] or you may drop this by either office. This paperwork will be filled out within 3 days after your surgery has been completed.  Please call our office with any questions or concerns that you have regarding your surgery and recovery.    Laparoscopic Nissen Fundoplication Laparoscopic Nissen fundoplication is surgery to relieve heartburn and other problems caused by gastric fluids flowing up into your esophagus. The esophagus is the tube that carries food and liquid from your throat to your stomach. Normally, the muscle that sits between your stomach and your esophagus (lower esophageal sphincter or LES) keeps stomach fluids in your stomach. In some  people, the LES does not work properly, and stomach fluids flow up into the esophagus. This can happen when part of the stomach bulges through the LES (hiatal hernia). The backward flow of stomach fluids can cause a type of severe and long-standing heartburn that is called gastroesophageal reflux disease (GERD). You may need this surgery if other treatments for GERD have not helped. In a laparoscopic Nissen fundoplication, the upper part of your stomach is wrapped around the lower part of your esophagus to strengthen the LES and prevent reflux. If you have a hiatal hernia, it will also be repaired with this surgery. The procedure is done through several small incisions in your abdomen. It is performed using a thin, telescopic instrument (laparoscope) and other instruments that can pass through the scope or through other small incisions. Tell a health care provider about: Any allergies you have. All medicines you are taking, including vitamins, herbs, eye drops, creams, and over-the-counter medicines. Any problems you or family members have had with anesthetic medicines. Any blood disorders you have. Any surgeries you have had. Any medical conditions you have. What are the risks? Generally, this is a safe procedure. However, problems may occur, including: Difficulty swallowing (dysphagia). Bloating. Nausea or vomiting. Damage to the lung, causing a collapsed lung. Infection or bleeding. What happens before the procedure? Ask your health care provider about: Changing or stopping your regular medicines. This is especially important  if you are taking diabetes medicines or blood thinners. Taking medicines such as aspirin and ibuprofen. These medicines can thin your blood. Do not take these medicines before your procedure if your health care provider asks you not to. Follow your health care provider's instructions about eating or drinking restrictions. Plan to have someone take you home after the  procedure. What happens during the procedure? An IV tube will be inserted into one of your veins. It will be used to give you fluids and medicines during the procedure. You will be given a medicine that makes you fall asleep (general anesthetic). Your abdomen will be cleaned with a germ-killing solution (antiseptic). The surgeon will make a small incision in your abdomen and insert a tube through the incision. Your abdomen will be filled with a gas. This helps the surgeon to see your organs more easily and it makes more space to work. The surgeon will insert the laparoscope through the incision. The scope has a camera that will send pictures to a monitor in the operating room. The surgeon will make several other small incisions in your abdomen to insert the other instruments that are needed during the procedure. Another instrument (dilator) will be passed through your mouth and down your esophagus into the upper part of your stomach. The dilator will prevent your LES from being closed too tightly during surgery. The surgeon will pass the top portion of your stomach behind the lower part of your esophagus and wrap it all the way around. This will be stitched into place. If you have a hiatal hernia, it will be repaired during this procedure. All instruments will be removed, and your incisions will be closed under your skin with stitches (sutures). Skin adhesive strips may also be used. A bandage (dressing) will be placed on your skin over the incisions. The procedure may vary among health care providers and hospitals. What happens after the procedure? You will be moved to a recovery area. Your blood pressure, heart rate, breathing rate, and blood oxygen level will be monitored often until the medicines you were given have worn off. You will be given pain medicine as needed. Your IV tube will be kept in until you are able to drink fluids. This information is not intended to replace advice given to  you by your health care provider. Make sure you discuss any questions you have with your health care provider. Document Released: 12/11/2014 Document Revised: 04/27/2016 Document Reviewed: 07/22/2014 Elsevier Interactive Patient Education  2017 [employer]   Laparoscopic Nissen Fundoplication, Care After Refer to this sheet in the next few weeks. These instructions provide you with information about caring for yourself after your procedure. Your health care provider may also give you more specific instructions. Your treatment has been planned according to current medical practices, but problems sometimes occur. Call your health care provider if you have any problems or questions after your procedure. What can I expect after the procedure? After the procedure, it is common to have: Difficulty swallowing (dysphagia). Excess gas (bloating). Follow these instructions at home: Medicines  Take medicines only as directed by your health care provider. Do not drive or operate heavy machinery while taking pain medicine. Incision care  There are many different ways to close and cover an incision, including stitches (sutures), skin glue, and adhesive strips. Follow your health care provider's instructions about: Incision care. Bandage (dressing) changes and removal. Incision closure removal. Check your incision areas every day for signs of infection. Watch for: Redness,  swelling, or pain. Fluid, blood, or pus. Do not take baths, swim, or use a hot tub until your health care provider approves. Take showers as directed by your health care provider. Eating and drinking  Follow your health care provider's instructions about eating. You may need to follow a liquid-only diet for 2 weeks, followed by a diet of soft foods for 2 weeks. You should return to your usual diet gradually. Drink enough fluid to keep your urine clear or pale yellow. Activity  Return to your normal activities as directed by your  health care provider. Ask your health care provider what activities are safe for you. Avoid strenuous exercise. Do not lift anything that is heavier than 10 lb (4.5 kg). Ask your health care provider when you can: Return to sexual activity. Drive. Go back to work. Contact a health care provider if: You have a fever. Your pain gets worse or is not helped by medicine. You have frequent nausea or vomiting. You have continued abdominal bloating. You have an ongoing (persistent) cough. You have redness, swelling, or pain in any incision areas. You have fluid, blood, or pus coming from any incisions. Get help right away if: You have trouble breathing. You are unable to swallow. You have persistent vomiting. You have blood in your vomit. You have severe abdominal pain. This information is not intended to replace advice given to you by your health care provider. Make sure you discuss any questions you have with your health care provider. Document Released: 07/13/2004 Document Revised: 04/27/2016 Document Reviewed: 07/22/2014 Elsevier Interactive Patient Education  2017 [place].   Diet After Nissen Fundoplication Surgery This diet information is for patients who have recently had Nissen fundoplication surgery to correct reflux disease or to repair various types of hernias, such as hiatal hernia and intrathoracic stomach. This diet may also be used for other gastrointestinal surgeries, such as Heller myotomy and repair of achalasia. The diet will help control diarrhea, excess gas and swallowing problems, which may occur after this type of surgery. Keeping Your Stomach from Stretching Eat small, frequent meals (six to eight per day). This will help you consume the majority of the nutrients you need without causing your stomach to feel full or distended.  Drinking large amounts of fluids with meals can stretch your stomach. You may drink fluids between meals as often as you like, but limit  fluids to 1/2 cup (4 fluid ounces) with meals and one cup (8 fluid ounces) with snacks.  Sit upright while eating and stay upright for 30 minutes after each meal. Gravity can help food move through your digestive tract. Do not lie down after eating. Sit upright for 2 hours after your last meal or snack of the day.  Eat very slowly. Take your time when eating.  Take small bites and chew your food well to help aid in swallowing and digestion.  Avoid crusty breads and sticky, gummy foods, such as bananas, fresh doughy breads, rolls and doughnuts. These types of foods become sticky and difficult to swallow.  Toasted breads tend to be better tolerated.  Lastly, if you eat sweets, consume them at the end of your meal to avoid a group of symptoms referred to as "dumping syndrome". This describes the rapid emptying of foods from the stomach to the small intestine. Sweetened beverages, candy and desserts move more rapidly and dump quickly into the intestines. This can cause symptoms of nausea, weakness, cold sweats, cramps, diarrhea and dizzy spells.  Avoiding Gas  Avoid drinking through a straw. Do not chew gum or tobacco. These actions cause you to swallow air, which produces excess gas in your stomach. Chew with your mouth closed.  Avoid any foods that cause stomach gas and distention. These foods include corn, dried beans, peas, lentils, onions, broccoli, cauliflower and any food from the cabbage family.  Avoid carbonated drinks, alcohol, citrus and tomato products.  When will I be able to eat a soft diet? After Nissen fundoplication surgery, your diet will be advanced slowly by your surgeon. Generally, you will be on a clear liquid diet for the first few meals. Then you will advance to the full liquid diet for a meal or two and eventually to a Nissen soft diet. Please be aware that each patient's tolerance to food is different. Your doctor will advance your diet depending on how well you progress after  surgery. Clear Liquid Diet  The first diet after surgery is the clear liquid diet. It includes the following liquids: Apple juice  Cranberry juice  Grape juice  Chicken broth  Beef broth  Flavored gelatin (Jell-O)  Decaf tea and coffee  Caffeinated beverages are permitted based on tolerance  Popsicles  [place] ice Carbonated drinks (sodas) are not allowed for the first six to eight weeks after surgery. After this time you can try them again in small amounts.  Full Liquid Diet The full liquid diet contains anything on the clear liquid diet, plus: Milk, soy, rice and almond (no chocolate)  Cream of wheat, cream of rice, grits  Strained creamed soups (no tomato or broccoli)  Vanilla and strawberry-flavored ice cream  Sherbet  Blended, custard styled or whipped yogurt (plain or vanilla only)  Vanilla and butterscotch pudding (no chocolate or coconut)  Nutritional drinks including Ensure, Boost, Carnation Instant Breakfast (no chocolate-flavored) Note: Dairy products, such as milk, ice cream and pudding, may cause diarrhea in some people just after surgery. You may need to avoid milk products. If so, substitute them with lactose-free beverages, such as soy, rice, Lactaid or almond milks.  Nissen Soft Diet Food Category Foods to Choose Foods to Avoid  Beverages Milk, such as, whole, 2%, 1%, non-fat, or skim, soy, rice, almond  Caffeinated and decaf tea and coffee  Powdered drink mixes (in moderation)  Non-citrus juices (apple, grape, cranberry or blends of these)  Fruit nectars  Nutritional drinks including Boost, Ensure, Carnation Instant Breakfast Chocolate milk, cocoa or other chocolate-flavored drinks  Carbonated drinks  Alcohol  Citrus juices like orange, grapefruit, lemon and lime  Breads Pancakes, [place] toast and waffles  Crackers (saltine, butter, soda, graham, Goldfish and Cheese Nips)  Toasted bread Untoasted bread, bagels, [place] and hard rolls, English muffins   Crackers with nuts, seeds, fresh or dried fruit, coconut, or highly seasoned, such as garlic or onion-flavored  Sweet rolls, coffee cake or doughnuts  Cereals Well cooked cereals, such as oatmeal (plain or flavored)  Cold cereal (Cornflakes, Rice Krispies, Cheerios, Special K plain, Rice Chex and puffed rice) Very coarse cereal, such as bran, shredded wheat  Any cereal with fresh or dried fruit, coconut, seeds or nuts  Desserts Eat in moderation and do not eat desserts or sweets by themselves. Plain cakes, cookies and cream-filled pies  Vanilla and butterscotch pudding or custard  Ice cream, ice milk, frozen yogurt and sherbet  Gelatin made from allowed foods  Fruit ices and popsicles Desserts containing chocolate, coconut, nuts, seeds, fresh or dried fruit, peppermint or spearmint  Eggs  Poached, hard  boiled or scrambled Fried eggs and highly seasoned eggs (deviled eggs)  Fats Eat in moderation. Butter and margarine  Mayonnaise and vegetable oils  Mildly seasoned cream sauces and gravies  Plain cream cheese  Sour cream Highly seasoned salad dressings, cream sauces and gravies  Bacon, bacon fat, ham fat, lard and salt pork  Fried foods  Nuts  Fruits Fruit juice  Any canned or cooked fruit except those listed in the AVOID column ALL fresh fruits, such as citrus, bananas and pineapple  Canned pineapple  Dried fruits, such as raisins, berries  Fruits with seeds, such as berries, kiwi and figs  Meat, Fish, Poultry, and [employer] may be ground, minced or chopped to ease swallowing and digestion  Tender, well cooked and moist cuts of beef, chicken, [place] and pork  Veal and lamb  Flaky, cooked fish  Canned tuna  Cottage and ricotta cheeses  Mild cheese, such as American, brick, mozzarella and baby Swiss  Creamy peanut butter  Plain custard or blended fruit yogurt  Moist casseroles, such as macaroni & cheese, tuna noodle  Grilled or toasted cheese sandwich Tough meats  with a lot of gristle  Fried, highly seasoned, smoked and fatty meat, fish or poultry, such as frankfurters, luncheon meats, sausage, bacon, spare ribs, beef brisket, sardines, anchovies, duck and goose  Chili and other entrees made with pepper or chili pepper  Shellfish  Strongly flavored cheeses, such as sharp cheese, extra sharp cheddar, cheese containing peppers or other seasonings  Crunchy peanut butter  Any yogurt with nuts, seeds, coconut, strawberries or raspberries  Potatoes and Starches Peeled, mashed or boiled white or sweet potatoes  Oven-baked potatoes without skin  Well cooked white rice, enriched noodles, barley, spaghetti, macaroni and other pastas Fried potatoes, potato skins and potato chips  Hard and soft taco shells  Fried, brown or wild rice  Soups Mildly flavored meat stocks  Cream soups made from allowed foods Highly seasoned soups and tomato based soups, cream soups made with gas producing vegetables, such as broccoli, cauliflower, onion, etc.  Sweets and Snacks Use in moderation and do not eat large amounts of sweets by themselves. Syrup, honey, jelly and seedless jam  Plain hard candies and plain candies made with allowed ingredients  Molasses  Marshmallows  Other candy made from allowed ingredients  Thin pretzels Jam, marmalade and preserves  Chocolate in any form  Any candy containing nuts, coconut, seeds, peppermint, spearmint or dried or fresh fruit  Popcorn, potato chips, tortilla chips  Soft or hard thick pretzels, such as sourdough  Vegetables Well cooked soft vegetables without seeds or skins, such as asparagus tips, beets, carrots, green and wax beans, chopped spinach, tender canned baby peas, squash and pumpkin Raw vegetables, tomatoes, tomato juice, tomato sauce and V-8 juice  Gas producing vegetables, such as broccoli, Brussel sprouts, cabbage, cauliflower, onions, corn, cucumber, green peppers, rutabagas, turnips, radishes and sauerkraut  Dried  beans, peas and lentils  Miscellaneous Salt and spices in moderation  Mustard and vinegar in moderation Fried or highly seasoned foods  Coconut and seeds  Pickles and olives  Chili sauces, ketchup, barbecue sauce, horseradish, black pepper, chili powder and onion and garlic seasonings  Any other strongly flavored seasoning, condiment, spice or herb not tolerated  Any food not tolerated

## 2022-09-27 NOTE — Progress Notes (Signed)
Cmp

## 2022-09-28 ENCOUNTER — Encounter: Payer: Self-pay | Admitting: Surgery

## 2022-09-28 NOTE — Progress Notes (Signed)
Surgical Consultation  09/29/2022  Hannah Clarke is an 56 y.o. female.   Chief Complaint  Patient presents with   Hiatal Hernia     HPI: Hannah Clarke is a 56 year old female seen in consultation at the request of Dr. Lanney Gins she comes in with transportation help her.  She does have significant history of intellectual disability as well has psychiatric history. It is not easy to understand exactly her complaints.  To my best of my abilities she complains of difficulty swallowing and reflux. Reflux worsening when laying supine, current PPI not relieving her sxs.  She endorses that the dysphagia is mainly in the cervical area.  She also endorses chronic nausea as well as chronic cough. She is her own guardian, she is independent but requires some assistance. Her Mother or any family members are not immediately available.  Patient is found to have uncontrolled GERD symptoms despite Dexilant PPI daily reports good compliance. Patient has significant anxiety, emotional distress causing chronic migraine headaches, TMJ syndrome, bipolar disorder.  She does have chronic asthma and uses inhalors.  She did have CT chest that I have personally reviewed shoing small hiatal hernia sliding, bariumd swallow similar findings.  Past Medical History:  Diagnosis Date   Anxiety    Chronic headaches    Depression    Dermatophytosis of nail    Diabetes mellitus without complication (HCC)    GERD (gastroesophageal reflux disease)    High cholesterol    Hypertension    Mental developmental delay    Onychia of toe    Panic attacks    Renal insufficiency     Past Surgical History:  Procedure Laterality Date   COLONOSCOPY WITH PROPOFOL N/A 10/09/2017   Procedure: COLONOSCOPY WITH PROPOFOL;  Surgeon: Toledo, Benay Pike, MD;  Location: [facility] END[facility]COPY;  Service: Gastroenterology;  Laterality: N/A;   COLONOSCOPY WITH PROPOFOL N/A 02/06/2018   Procedure: COLONOSCOPY WITH PROPOFOL;  Surgeon: Toledo, Benay Pike, MD;   Location: [facility]OSCOPY;  Service: Gastroenterology;  Laterality: N/A;   ESOPHAGOGASTRODUODENOSCOPY (EGD) WITH PROPOFOL N/A 10/09/2017   Procedure: ESOPHAGOGASTRODUODENOSCOPY (EGD) WITH PROPOFOL;  Surgeon: Toledo, Benay Pike, MD;  Location: [facility] END[facility]COPY;  Service: Gastroenterology;  Laterality: N/A;   ESOPHAGOGASTRODUODENOSCOPY (EGD) WITH PROPOFOL N/A 07/31/2018   Procedure: ESOPHAGOGASTRODUODENOSCOPY (EGD) WITH PROPOFOL;  Surgeon: Toledo, Benay Pike, MD;  Location: [facility] ENDOSCOPY;  Service: Gastroenterology;  Laterality: N/A;   HAND SURGERY     KIDNEY STONE SURGERY      Family History  Problem Relation Age of Onset   Breast cancer Mother 40       pre pt.    Diabetes Father    Diabetes Paternal Aunt    Diabetes Paternal Uncle    Diabetes Paternal Grandmother    Diabetes Paternal Grandfather     Social History:  reports that she has never smoked. She has never used smokeless tobacco. She reports that she does not currently use alcohol. She reports that she does not use drugs.  Allergies: No Known Allergies  Medications reviewed.     ROS Full ROS performed and is otherwise negative other than what is stated in the HPI    BP (!) 156/85   Pulse 92   Temp 99.2 F (37.3 C) (Oral)   Ht '5\' 2"'$  (1.575 m)   Wt 146 lb 12.8 oz (66.6 kg)   SpO2 98%   BMI 26.85 kg/m   CONSTITUTIONAL: NAD. EYES: Pupils are equal, round,  Sclera are non-icteric. EARS, NOSE, MOUTH AND THROAT: The oral  mucosa is pink and moist. Hearing is intact to voice. LYMPH NODES:  Lymph nodes in the neck are normal. RESPIRATORY:  Lungs are clear. There is normal respiratory effort, with equal breath sounds bilaterally, and without pathologic use of accessory muscles. CARDIOVASCULAR: Heart is regular without murmurs, gallops, or rubs. GI: The abdomen is  soft, nontender, and nondistended. There are no palpable masses. There is no hepatosplenomegaly. There are normal bowel sounds in all quadrants. GU: Rectal  deferred.   MUSCULOSKELETAL: Normal muscle strength and tone. No cyanosis or edema.   SKIN: Turgor is good and there are no pathologic skin lesions or ulcers. NEUROLOGIC: Motor and sensation is grossly normal. Cranial nerves are grossly intact. PSYCH:  Oriented to person, place and time. Affect is normal. She does have some intellectual diabilities    Assessment/Plan: 56 yo female with chronic pulmonary sxs a, cough and GERD and sliding hiatal hernia. I do think there might be a role for anireflux procedure. I will continue w/u w endoscopic evaluation. I do think we need to interrogate and check with a family member, I did explain to her the procedure, risks, benefits and possible complications. She seems to understand but at the same time she does have some intellectual disabilities. She is her own legal guardian. I have d/w her and her air that next appt I will like to have one of the family members present to understand more about her intellectual challenges and to make sure that she will be able to follow post op instruction, diet and give a full informed consent.    Please note that I spent 60 minutes in this encounter including personally reviewing medical records, imaging studies, placing orders, counseling the patient and performing appropriate documentation  Caroleen Hamman, MD [facility] Surgeon

## 2022-09-29 ENCOUNTER — Encounter: Payer: Self-pay | Admitting: Surgery

## 2022-10-03 ENCOUNTER — Ambulatory Visit
Admission: RE | Admit: 2022-10-03 | Discharge: 2022-10-03 | Disposition: A | Discharge status: Home / Self Care | Payer: Medicare HMO | Source: Ambulatory Visit | Attending: Surgery | Admitting: Surgery

## 2022-10-03 ENCOUNTER — Other Ambulatory Visit
Admission: RE | Admit: 2022-10-03 | Discharge: 2022-10-03 | Disposition: A | Discharge status: Home / Self Care | Payer: Medicare HMO | Source: Home / Self Care | Attending: Surgery | Admitting: Surgery

## 2022-10-03 DIAGNOSIS — R1310 Dysphagia, unspecified: Secondary | ICD-10-CM | POA: Insufficient documentation

## 2022-10-03 DIAGNOSIS — J4540 Moderate persistent asthma, uncomplicated: Secondary | ICD-10-CM | POA: Diagnosis not present

## 2022-10-03 LAB — COMPREHENSIVE METABOLIC PANEL
ALT: 13 U/L (ref 0–44)
AST: 12 U/L — ABNORMAL LOW (ref 15–41)
Albumin: 4.5 g/dL (ref 3.5–5.0)
Alkaline Phosphatase: 46 U/L (ref 38–126)
Anion gap: 7 (ref 5–15)
BUN: 12 mg/dL (ref 6–20)
CO2: 22 mmol/L (ref 22–32)
Calcium: 8.9 mg/dL (ref 8.9–10.3)
Chloride: 111 mmol/L (ref 98–111)
Creatinine, Ser: 0.44 mg/dL (ref 0.44–1.00)
GFR, Estimated: >60 mL/min (ref >60)
Glucose, Bld: 126 mg/dL — ABNORMAL HIGH (ref 70–99)
Potassium: 3.5 mmol/L (ref 3.5–5.1)
Sodium: 140 mmol/L (ref 135–145)
Total Bilirubin: 0.6 mg/dL (ref 0.3–1.2)
Total Protein: 7.1 g/dL (ref 6.5–8.1)

## 2022-10-03 MED ORDER — IOHEXOL 300 MG/ML  SOLN
100.0000 mL | Freq: Once | INTRAMUSCULAR | Status: AC | PRN
  Administered 2022-10-03: 100 mL via INTRAVENOUS

## 2022-10-17 ENCOUNTER — Other Ambulatory Visit: Payer: Self-pay

## 2022-10-17 ENCOUNTER — Ambulatory Visit: Admission: RE | Admit: 2022-10-17 | Payer: Medicare HMO | Source: Ambulatory Visit

## 2022-10-17 DIAGNOSIS — J4540 Moderate persistent asthma, uncomplicated: Secondary | ICD-10-CM | POA: Diagnosis not present

## 2022-10-17 DIAGNOSIS — K5900 Constipation, unspecified: Secondary | ICD-10-CM

## 2022-10-17 MED ORDER — POLYETHYLENE GLYCOL 3350 17 G PO PACK: 17.0000 g | PACK | Freq: Every day | ORAL | 0 refills | Status: DC

## 2022-10-20 ENCOUNTER — Ambulatory Visit
Admission: RE | Admit: 2022-10-20 | Discharge: 2022-10-20 | Disposition: A | Discharge status: Home / Self Care | Payer: Medicare HMO | Source: Ambulatory Visit | Attending: Surgery | Admitting: Surgery

## 2022-10-20 ENCOUNTER — Ambulatory Visit
Admission: RE | Admit: 2022-10-20 | Discharge: 2022-10-20 | Disposition: A | Discharge status: Home / Self Care | Payer: Medicare HMO | Attending: Surgery | Admitting: Surgery

## 2022-10-20 DIAGNOSIS — K5900 Constipation, unspecified: Secondary | ICD-10-CM | POA: Diagnosis not present

## 2022-10-20 DIAGNOSIS — K5939 Other megacolon: Secondary | ICD-10-CM | POA: Diagnosis not present

## 2022-10-31 DIAGNOSIS — E785 Hyperlipidemia, unspecified: Secondary | ICD-10-CM | POA: Diagnosis not present

## 2022-10-31 DIAGNOSIS — K219 Gastro-esophageal reflux disease without esophagitis: Secondary | ICD-10-CM | POA: Diagnosis not present

## 2022-10-31 DIAGNOSIS — G4733 Obstructive sleep apnea (adult) (pediatric): Secondary | ICD-10-CM | POA: Diagnosis not present

## 2022-10-31 DIAGNOSIS — I10 Essential (primary) hypertension: Secondary | ICD-10-CM | POA: Diagnosis not present

## 2022-10-31 DIAGNOSIS — E669 Obesity, unspecified: Secondary | ICD-10-CM | POA: Diagnosis not present

## 2022-11-07 DIAGNOSIS — J4540 Moderate persistent asthma, uncomplicated: Secondary | ICD-10-CM | POA: Diagnosis not present

## 2022-11-09 DIAGNOSIS — E1165 Type 2 diabetes mellitus with hyperglycemia: Secondary | ICD-10-CM | POA: Diagnosis not present

## 2022-11-09 DIAGNOSIS — E1159 Type 2 diabetes mellitus with other circulatory complications: Secondary | ICD-10-CM | POA: Diagnosis not present

## 2022-11-09 DIAGNOSIS — E785 Hyperlipidemia, unspecified: Secondary | ICD-10-CM | POA: Diagnosis not present

## 2022-11-09 DIAGNOSIS — I152 Hypertension secondary to endocrine disorders: Secondary | ICD-10-CM | POA: Diagnosis not present

## 2022-11-09 DIAGNOSIS — E1169 Type 2 diabetes mellitus with other specified complication: Secondary | ICD-10-CM | POA: Diagnosis not present

## 2022-11-14 DIAGNOSIS — G4733 Obstructive sleep apnea (adult) (pediatric): Secondary | ICD-10-CM | POA: Diagnosis not present

## 2022-11-14 DIAGNOSIS — E785 Hyperlipidemia, unspecified: Secondary | ICD-10-CM | POA: Diagnosis not present

## 2022-11-14 DIAGNOSIS — I10 Essential (primary) hypertension: Secondary | ICD-10-CM | POA: Diagnosis not present

## 2022-11-14 DIAGNOSIS — K219 Gastro-esophageal reflux disease without esophagitis: Secondary | ICD-10-CM | POA: Diagnosis not present

## 2022-11-16 ENCOUNTER — Telehealth: Payer: Self-pay

## 2022-11-16 NOTE — Telephone Encounter (Signed)
Message left for [facility] regarding getting the patient rescheduled for her CT scan. She was unable to do this last month due to still having Barium in her system.

## 2022-11-16 NOTE — Telephone Encounter (Signed)
They are not able to schedule to CT because the order expired, so a new order needs to be placed.

## 2022-11-17 NOTE — Telephone Encounter (Signed)
Spoke with Hannah Clarke and she will have the staff reschedule her CT scan and call us back to schedule a follow up with Dr Dahlia Byes for after this and her Upper Endoscopy on December 27th.

## 2022-11-21 DIAGNOSIS — J4540 Moderate persistent asthma, uncomplicated: Secondary | ICD-10-CM | POA: Diagnosis not present

## 2022-11-23 ENCOUNTER — Ambulatory Visit: Admission: RE | Admit: 2022-11-23 | Payer: Medicare HMO | Source: Ambulatory Visit

## 2022-11-24 ENCOUNTER — Encounter: Payer: Self-pay | Admitting: Internal Medicine

## 2022-11-24 ENCOUNTER — Telehealth: Payer: Self-pay

## 2022-11-24 ENCOUNTER — Other Ambulatory Visit: Payer: Medicare HMO

## 2022-11-24 NOTE — Telephone Encounter (Signed)
New authorization obtained for CT scan. The patient is rescheduled for her CT scan on 12/05/22 at [facility]. Her arrival time is 12:15 pm. She will have nothing to eat for 4 hours prior, but will need to drink 2 cups of water an hour prior to the scan.  The patient is aware of date, time, and instructions.

## 2022-11-29 ENCOUNTER — Encounter: Payer: Self-pay | Admitting: Internal Medicine

## 2022-11-29 ENCOUNTER — Encounter
Admission: RE | Disposition: A | Discharge status: Home / Self Care | Payer: Self-pay | Source: Ambulatory Visit | Attending: Internal Medicine

## 2022-11-29 ENCOUNTER — Other Ambulatory Visit: Payer: Medicare HMO

## 2022-11-29 ENCOUNTER — Ambulatory Visit: Payer: Medicare HMO | Admitting: General Practice

## 2022-11-29 ENCOUNTER — Ambulatory Visit
Admission: RE | Admit: 2022-11-29 | Discharge: 2022-11-29 | Disposition: A | Discharge status: Home / Self Care | Payer: Medicare HMO | Source: Ambulatory Visit | Attending: Internal Medicine | Admitting: Internal Medicine

## 2022-11-29 DIAGNOSIS — Z7951 Long term (current) use of inhaled steroids: Secondary | ICD-10-CM | POA: Diagnosis not present

## 2022-11-29 DIAGNOSIS — F32A Depression, unspecified: Secondary | ICD-10-CM | POA: Insufficient documentation

## 2022-11-29 DIAGNOSIS — E119 Type 2 diabetes mellitus without complications: Secondary | ICD-10-CM | POA: Insufficient documentation

## 2022-11-29 DIAGNOSIS — Z7984 Long term (current) use of oral hypoglycemic drugs: Secondary | ICD-10-CM | POA: Diagnosis not present

## 2022-11-29 DIAGNOSIS — K449 Diaphragmatic hernia without obstruction or gangrene: Secondary | ICD-10-CM | POA: Insufficient documentation

## 2022-11-29 DIAGNOSIS — Z79899 Other long term (current) drug therapy: Secondary | ICD-10-CM | POA: Diagnosis not present

## 2022-11-29 DIAGNOSIS — K208 Other esophagitis without bleeding: Secondary | ICD-10-CM | POA: Diagnosis not present

## 2022-11-29 DIAGNOSIS — R1313 Dysphagia, pharyngeal phase: Secondary | ICD-10-CM | POA: Insufficient documentation

## 2022-11-29 DIAGNOSIS — I10 Essential (primary) hypertension: Secondary | ICD-10-CM | POA: Diagnosis not present

## 2022-11-29 DIAGNOSIS — F418 Other specified anxiety disorders: Secondary | ICD-10-CM | POA: Diagnosis not present

## 2022-11-29 DIAGNOSIS — E7800 Pure hypercholesterolemia, unspecified: Secondary | ICD-10-CM | POA: Diagnosis not present

## 2022-11-29 DIAGNOSIS — K2100 Gastro-esophageal reflux disease with esophagitis, without bleeding: Secondary | ICD-10-CM | POA: Diagnosis not present

## 2022-11-29 DIAGNOSIS — K2289 Other specified disease of esophagus: Secondary | ICD-10-CM | POA: Insufficient documentation

## 2022-11-29 DIAGNOSIS — Z7985 Long-term (current) use of injectable non-insulin antidiabetic drugs: Secondary | ICD-10-CM | POA: Diagnosis not present

## 2022-11-29 DIAGNOSIS — Z793 Long term (current) use of hormonal contraceptives: Secondary | ICD-10-CM | POA: Insufficient documentation

## 2022-11-29 DIAGNOSIS — F419 Anxiety disorder, unspecified: Secondary | ICD-10-CM | POA: Diagnosis not present

## 2022-11-29 DIAGNOSIS — K219 Gastro-esophageal reflux disease without esophagitis: Secondary | ICD-10-CM | POA: Diagnosis not present

## 2022-11-29 HISTORY — DX: Personal history of urinary calculi: Z87.442

## 2022-11-29 HISTORY — PX: ESOPHAGOGASTRODUODENOSCOPY (EGD) WITH PROPOFOL: SHX5813

## 2022-11-29 SURGERY — ESOPHAGOGASTRODUODENOSCOPY (EGD) WITH PROPOFOL
Anesthesia: General

## 2022-11-29 MED ORDER — SODIUM CHLORIDE 0.9 % IV SOLN
INTRAVENOUS | Status: DC
  Administered 2022-11-29: 1000 mL via INTRAVENOUS

## 2022-11-29 MED ORDER — SUCCINYLCHOLINE CHLORIDE 200 MG/10ML IV SOSY
PREFILLED_SYRINGE | INTRAVENOUS | Status: AC
  Filled 2022-11-29: qty 10

## 2022-11-29 MED ORDER — ONDANSETRON HCL 4 MG/2ML IJ SOLN
INTRAMUSCULAR | Status: DC | PRN
  Administered 2022-11-29: 4 mg via INTRAVENOUS

## 2022-11-29 MED ORDER — LIDOCAINE HCL (CARDIAC) PF 100 MG/5ML IV SOSY
PREFILLED_SYRINGE | INTRAVENOUS | Status: DC | PRN
  Administered 2022-11-29: 100 mg via INTRAVENOUS

## 2022-11-29 MED ORDER — LIDOCAINE HCL (PF) 1 % IJ SOLN
INTRAMUSCULAR | Status: AC
  Filled 2022-11-29: qty 2

## 2022-11-29 MED ORDER — PROPOFOL 10 MG/ML IV BOLUS
INTRAVENOUS | Status: DC | PRN
  Administered 2022-11-29: 60 mg via INTRAVENOUS
  Administered 2022-11-29: 10 mg via INTRAVENOUS
  Administered 2022-11-29: 30 mg via INTRAVENOUS

## 2022-11-29 MED ORDER — ONDANSETRON HCL 4 MG/2ML IJ SOLN
INTRAMUSCULAR | Status: AC
  Filled 2022-11-29: qty 2

## 2022-11-29 MED ORDER — ROCURONIUM BROMIDE 10 MG/ML (PF) SYRINGE
PREFILLED_SYRINGE | INTRAVENOUS | Status: AC
  Filled 2022-11-29: qty 10

## 2022-11-29 MED ORDER — FENTANYL CITRATE (PF) 100 MCG/2ML IJ SOLN
INTRAMUSCULAR | Status: AC
  Filled 2022-11-29: qty 2

## 2022-11-29 MED ORDER — DEXAMETHASONE SODIUM PHOSPHATE 10 MG/ML IJ SOLN
INTRAMUSCULAR | Status: AC
  Filled 2022-11-29: qty 1

## 2022-11-29 MED ORDER — PROPOFOL 500 MG/50ML IV EMUL
INTRAVENOUS | Status: DC | PRN
  Administered 2022-11-29: 150 ug/kg/min via INTRAVENOUS

## 2022-11-29 NOTE — Brief Op Note (Signed)
Pt and caregiver states that the patient signs her own consents and is independent at [employer] services. Pt. States that she understands instructions from anesthesia and RNs. Consent signed by pt

## 2022-11-29 NOTE — Op Note (Signed)
[facility] Gastroenterology Patient Name: Hannah Clarke Procedure Date: 11/29/2022 11:37 AM MRN: [id number] Account #: [id number] Date of Birth: June 11, 1966 Admit Type: Outpatient Age: 56 Room: [facility] ENDO ROOM 2 Gender: Female Note Status: Finalized Instrument Name: Michaelle Birks [id number] Procedure:             Upper GI endoscopy Indications:           Pharyngeal phase dysphagia Providers:             Lorie Apley K. Alice Reichert MD, MD Referring MD:          No Local Md, MD (Referring MD) Medicines:             Propofol per Anesthesia Complications:         No immediate complications. Estimated blood loss: None. Procedure:             Pre-Anesthesia Assessment:                        - The risks and benefits of the procedure and the                         sedation options and risks were discussed with the                         patient. All questions were answered and informed                         consent was obtained.                        - Patient identification and proposed procedure were                         verified prior to the procedure by the nurse. The                         procedure was verified in the procedure room.                        - ASA Grade Assessment: III - A patient with severe                         systemic disease.                        - After reviewing the risks and benefits, the patient                         was deemed in satisfactory condition to undergo the                         procedure.                        After obtaining informed consent, the endoscope was                         passed under direct vision. Throughout the procedure,  the patient's blood pressure, pulse, and oxygen                         saturations were monitored continuously. The Endoscope                         was introduced through the mouth, and advanced to the                         third part of duodenum. The upper  GI endoscopy was                         accomplished without difficulty. The patient tolerated                         the procedure well. Findings:      Mucosal changes including feline appearance were found in the entire       esophagus. Biopsies were obtained from the proximal and distal esophagus       with cold forceps for histology of suspected eosinophilic esophagitis.      LA Grade A (one or more mucosal breaks less than 5 mm, not extending       between tops of 2 mucosal folds) esophagitis with no bleeding was found       at the gastroesophageal junction.      There is no endoscopic evidence of stenosis or stricture in the entire       esophagus.      A 2 cm hiatal hernia was present.      The exam of the stomach was otherwise normal.      The examined duodenum was normal.      The exam was otherwise without abnormality. Impression:            - Esophageal mucosal changes suggestive of                         eosinophilic esophagitis.                        - LA Grade A reflux esophagitis with no bleeding.                        - 2 cm hiatal hernia.                        - Normal examined duodenum.                        - The examination was otherwise normal.                        - Biopsies were taken with a cold forceps for                         evaluation of eosinophilic esophagitis. Recommendation:        - Patient has a contact number available for                         emergencies. The signs and symptoms of potential  delayed complications were discussed with the patient.                         Return to normal activities tomorrow. Written                         discharge instructions were provided to the patient.                        - Resume previous diet.                        - Continue present medications.                        - Await pathology results.                        - Return to physician assistant in 3 months.                         - Telephone GI office to schedule appointment. Procedure Code(s):     --- Professional ---                        [phone], Esophagogastroduodenoscopy, flexible,                         transoral; with biopsy, single or multiple Diagnosis Code(s):     --- Professional ---                        R13.13, Dysphagia, pharyngeal phase                        K44.9, Diaphragmatic hernia without obstruction or                         gangrene                        K21.00, Gastro-esophageal reflux disease with                         esophagitis, without bleeding                        K22.89, Other specified disease of esophagus CPT copyright 2022 American Medical Association. All rights reserved. The codes documented in this report are preliminary and upon coder review may  be revised to meet current compliance requirements. Efrain Sella MD, MD 11/29/2022 1:06:29 PM This report has been signed electronically. Number of Addenda: 0 Note Initiated On: 11/29/2022 11:37 AM Estimated Blood Loss:  Estimated blood loss: none.      [facility]

## 2022-11-29 NOTE — H&P (Signed)
Outpatient short stay form Pre-procedure 11/29/2022 12:38 PM Trinty Marken K. Alice Reichert, M.D.  Primary Physician: Evern Bio, NP  Reason for visit:  Dysphagia pharyngoesophageal, GERD  History of present illness:     - EGD: 07/31/2018 - no endoscopic esophageal abnormality to explain patient's dysphagia, erythematous mucosa in gastric body and antrum, multiple fundic gland polyps, 2 cm hiatal hernia, normal duodenum  Ms. Bronaugh presents to the [place] clinic for 44-monthfollow-up of IBS-C, GERD without esophagitis, and abdominal bloating. History is difficult given patient's baseline cognitive deficits. Caregiver, ECharlena Cross is present with her from [employer] She reports her GI symptoms are virtually the same. She has several complaints. She complains of constipation, persistent abdominal bloating, GERD, dysphagia, and dyspepsia symptoms. She continues to follow closely with Dr. ALanney Ginsin Pulmonology and is now on Dupixent injections. She reports she can have on average 2-3 bowel movements per week. She can have a day of loose stool and then go 3-4 days in between bowel movements. She denies any hematochezia or melena. She is continuing to take Linzess 290 mcg once daily in the mornings. She has not made any significant dietary changes to help with GERD symptoms. She frequently snacks on potato chips, sodas, and sweets. She has not tried to reduce her caffeine intake. She reports Dr. ALanney Ginshas talked to her about hernia and she is concerned about this. She continues to drink through straws frequently. She eats out frequently during the week. She is working part time at [employer] also has accepted a job at [employer] No new concerns or complaints today.    Medications Prior to Admission  Medication Sig Dispense Refill Last Dose   azelastine (ASTELIN) 0.1 % nasal spray    11/28/2022   buPROPion (WELLBUTRIN XL) 300 MG 24 hr tablet    11/29/2022 at 0630   busPIRone (BUSPAR) 30  MG tablet    11/29/2022   cetirizine (ZYRTEC) 10 MG tablet Take 10 mg by mouth daily.   11/28/2022   cholecalciferol (VITAMIN D) 1000 units tablet Take 1,000 Units by mouth daily.   Past Week   citalopram (CELEXA) 10 MG tablet Take 10 mg by mouth daily.   11/29/2022   dexlansoprazole (DEXILANT) 60 MG capsule Take 60 mg by mouth daily.   11/29/2022   Dulaglutide 0.75 MG/0.5ML SOPN Inject into the skin.   11/29/2022   Dupilumab 300 MG/2ML SOPN Inject into the skin.   11/29/2022   famotidine (PEPCID) 40 MG tablet    11/29/2022   LINZESS 290 MCG CAPS capsule    11/29/2022   medroxyPROGESTERone (DEPO-PROVERA) 150 MG/ML injection Inject 150 mg into the muscle every 3 (three) months.   Past Month   metFORMIN (GLUCOPHAGE) 500 MG tablet Take 2 tablets by mouth 2 (two) times daily.   11/29/2022   montelukast (SINGULAIR) 10 MG tablet Take 10 mg by mouth daily.   11/29/2022   nortriptyline (PAMELOR) 50 MG capsule    11/29/2022   ondansetron (ZOFRAN) 4 MG tablet Take 1 tablet by mouth every 8 (eight) hours as needed.   11/29/2022   pantoprazole (PROTONIX) 40 MG tablet Take 40 mg by mouth daily.   11/29/2022   polyethylene glycol (MIRALAX MIX-IN PAX) 17 g packet Take 17 g by mouth daily. Take 3 times a day for 2 days, then 2 times a day for one week. Then may reduce to 1-2 times a day as needed. 14 each 0 Past Week   potassium chloride SA (  K-DUR,KLOR-CON) 20 MEQ tablet Take 20 mEq by mouth daily.   11/29/2022   rosuvastatin (CRESTOR) 10 MG tablet    11/29/2022   sennosides-docusate sodium (SENOKOT-S) 8.6-50 MG tablet Take 1 tablet by mouth daily.   11/28/2022   simethicone (MYLICON) 790 MG chewable tablet TAKE 1 TABLET(CHEW) BY MOUTH EVERY 6 HOURS AS NEEDED FOR FLATULENCE   11/28/2022   sucralfate (CARAFATE) 1 g tablet Take 1 g by mouth 4 (four) times daily -  with meals and at bedtime.   11/28/2022   TRUE METRIX BLOOD GLUCOSE TEST test strip 1 each 2 (two) times daily.   11/28/2022   acetaminophen (TYLENOL)  325 MG tablet Take 325 mg by mouth every 6 (six) hours as needed.      albuterol (PROVENTIL HFA;VENTOLIN HFA) 108 (90 BASE) MCG/ACT inhaler Inhale 2 puffs into the lungs every 6 (six) hours as needed for wheezing or shortness of breath. 1 Inhaler 2    benzonatate (TESSALON) 100 MG capsule TAKE 1 CAPSULE BY MOUTH EVERY 8 HOURS ASNEEDED FOR COUGH    at prn   budesonide (PULMICORT) 0.5 MG/2ML nebulizer solution Inhale into the lungs.    at prn   EPINEPHrine 0.3 mg/0.3 mL IJ SOAJ injection     at prn   fluticasone (FLONASE) 50 MCG/ACT nasal spray       Fluticasone-Umeclidin-Vilant (TRELEGY ELLIPTA) 200-62.5-25 MCG/ACT AEPB Inhale into the lungs.      gabapentin (NEURONTIN) 400 MG capsule       ipratropium (ATROVENT) 0.06 % nasal spray       lactulose (CHRONULAC) 10 GM/15ML solution Take 30 mLs (20 g total) by mouth daily as needed for mild constipation. 120 mL 0    LORazepam (ATIVAN) 1 MG tablet Take 1 tablet (1 mg total) by mouth every 8 (eight) hours as needed for anxiety. 15 tablet 0    losartan (COZAAR) 100 MG tablet       lubiprostone (AMITIZA) 24 MCG capsule Take by mouth.      MODERNA COVID-19 VACCINE 100 MCG/0.5ML injection       Peppermint Oil (IBGARD) 90 MG CPCR TAKE 2 CAPSULES BY MOUTH 2 TIMES A DAY BEFORE MEALS      polyethylene glycol powder (GLYCOLAX/MIRALAX) 17 GM/SCOOP powder MIX 34 GRAMS(2 CAPFULS) IN 8 OUNCES OF LIQUID OF CHOICE AND DRINK BY MOUTH DAILY IN THE MORNING        No Known Allergies   Past Medical History:  Diagnosis Date   Anxiety    Chronic headaches    Depression    Dermatophytosis of nail    Diabetes mellitus without complication (HCC)    GERD (gastroesophageal reflux disease)    High cholesterol    History of kidney stones    Hypertension    Mental developmental delay    Onychia of toe    Panic attacks     Review of systems:  Otherwise negative.    Physical Exam  Gen: Alert, oriented. Appears stated age.  HEENT: [place]/AT. PERRLA. Lungs: CTA, no  wheezes. CV: RR nl S1, S2. Abd: soft, benign, no masses. BS+ Ext: No edema. Pulses 2+    Planned procedures: Proceed with colonoscopy. The patient understands the nature of the planned procedure, indications, risks, alternatives and potential complications including but not limited to bleeding, infection, perforation, damage to internal organs and possible oversedation/side effects from anesthesia. The patient agrees and gives consent to proceed.  Please refer to procedure notes for findings, recommendations and patient disposition/instructions.  Taniaya Rudder K. Alice Reichert, M.D. Gastroenterology 11/29/2022  12:38 PM

## 2022-11-29 NOTE — Transfer of Care (Signed)
Immediate Anesthesia Transfer of Care Note  Patient: Hannah Clarke  Procedure(s) Performed: ESOPHAGOGASTRODUODENOSCOPY (EGD) WITH PROPOFOL  Patient Location: Endoscopy Unit  Anesthesia Type:General  Level of Consciousness: drowsy  Airway & Oxygen Therapy: Patient Spontanous Breathing  Post-op Assessment: Report given to RN and Post -op Vital signs reviewed and stable  Post vital signs: Reviewed and stable  Last Vitals:  Vitals Value Taken Time  BP 112/72 11/29/22 1305  Temp 36.4 C 11/29/22 1305  Pulse 101 11/29/22 1307  Resp 24 11/29/22 1307  SpO2 100 % 11/29/22 1307  Vitals shown include unvalidated device data.  Last Pain:  Vitals:   11/29/22 1305  TempSrc: Temporal  PainSc: 0-No pain         Complications: No notable events documented.

## 2022-11-29 NOTE — Anesthesia Postprocedure Evaluation (Signed)
Anesthesia Post Note  Patient: Hannah Clarke  Procedure(s) Performed: ESOPHAGOGASTRODUODENOSCOPY (EGD) WITH PROPOFOL  Patient location during evaluation: Endoscopy Anesthesia Type: General Level of consciousness: awake and alert Pain management: pain level controlled Vital Signs Assessment: post-procedure vital signs reviewed and stable Respiratory status: spontaneous breathing, nonlabored ventilation, respiratory function stable and patient connected to nasal cannula oxygen Cardiovascular status: blood pressure returned to baseline and stable Postop Assessment: no apparent nausea or vomiting Anesthetic complications: no  No notable events documented.   Last Vitals:  Vitals:   11/29/22 1315 11/29/22 1325  BP: 133/89 (!) 125/94  Pulse: 96 88  Resp: 16 (!) 21  Temp:    SpO2: 100% 98%    Last Pain:  Vitals:   11/29/22 1325  TempSrc:   PainSc: 0-No pain                 Dimas Millin

## 2022-11-29 NOTE — Interval H&P Note (Signed)
History and Physical Interval Note:  11/29/2022 12:39 PM  Hannah Clarke  has presented today for surgery, with the diagnosis of K21.9  - GERD without esophagitis R13.14 - Pharyngoesophageal dysphagia R14.0  - Abdominal bloating K44.9, K21.9  - Hiatal hernia with gastroesophageal reflux.  The various methods of treatment have been discussed with the patient and family. After consideration of risks, benefits and other options for treatment, the patient has consented to  Procedure(s): ESOPHAGOGASTRODUODENOSCOPY (EGD) WITH PROPOFOL (N/A) as a surgical intervention.  The patient's history has been reviewed, patient examined, no change in status, stable for surgery.  I have reviewed the patient's chart and labs.  Questions were answered to the patient's satisfaction.     [place], [place]

## 2022-11-29 NOTE — Anesthesia Preprocedure Evaluation (Addendum)
Anesthesia Evaluation  Patient identified by MRN, date of birth, ID band Patient awake    Reviewed: Allergy & Precautions, H&P , NPO status , Patient's Chart, lab work & pertinent test results, reviewed documented beta blocker date and time   Airway Mallampati: III   Neck ROM: full    Dental  (+) Poor Dentition, Dental Advidsory Given   Pulmonary sleep apnea, Continuous Positive Airway Pressure Ventilation and Oxygen sleep apnea , COPD,  COPD inhaler and oxygen dependent   Pulmonary exam normal        Cardiovascular Exercise Tolerance: Good hypertension, On Medications + angina with exertion Normal cardiovascular exam Rhythm:regular Rate:Normal     Neuro/Psych  Headaches PSYCHIATRIC DISORDERS Anxiety Depression    negative neurological ROS  negative psych ROS   GI/Hepatic negative GI ROS, Neg liver ROS,GERD  Medicated,,  Endo/Other  diabetes    Renal/GU Renal disease  negative genitourinary   Musculoskeletal   Abdominal   Peds  Hematology negative hematology ROS (+)   Anesthesia Other Findings Past Medical History: No date: Anxiety No date: Chronic headaches No date: Depression No date: Dermatophytosis of nail No date: Diabetes mellitus without complication (HCC) No date: GERD (gastroesophageal reflux disease) No date: High cholesterol No date: Hypertension No date: Mental developmental delay No date: Onychia of toe No date: Panic attacks Past Surgical History: 10/09/2017: COLONOSCOPY WITH PROPOFOL; N/A     Comment:  Procedure: COLONOSCOPY WITH PROPOFOL;  Surgeon: Toledo,               Benay Pike, MD;  Location: [facility] ENDOSCOPY;  Service:               Gastroenterology;  Laterality: N/A; 02/06/2018: COLONOSCOPY WITH PROPOFOL; N/A     Comment:  Procedure: COLONOSCOPY WITH PROPOFOL;  Surgeon: Toledo,               Benay Pike, MD;  Location: [facility] ENDOSCOPY;  Service:               Gastroenterology;  Laterality:  N/A; 10/09/2017: ESOPHAGOGASTRODUODENOSCOPY (EGD) WITH PROPOFOL; N/A     Comment:  Procedure: ESOPHAGOGASTRODUODENOSCOPY (EGD) WITH               PROPOFOL;  Surgeon: Toledo, Benay Pike, MD;  Location:               [facility] ENDOSCOPY;  Service: Gastroenterology;  Laterality:               N/A; No date: HAND SURGERY No date: KIDNEY STONE SURGERY   Reproductive/Obstetrics negative OB ROS                             Anesthesia Physical Anesthesia Plan  ASA: III  Anesthesia Plan: General   Post-op Pain Management: Minimal or no pain anticipated   Induction: Intravenous  PONV Risk Score and Plan: 3 and Ondansetron, TIVA and Propofol infusion  Airway Management Planned: Nasal Cannula and Natural Airway  Additional Equipment: None  Intra-op Plan:   Post-operative Plan:   Informed Consent: I have reviewed the patients History and Physical, chart, labs and discussed the procedure including the risks, benefits and alternatives for the proposed anesthesia with the patient or authorized representative who has indicated his/her understanding and acceptance.     Dental Advisory Given  Plan Discussed with: CRNA  Anesthesia Plan Comments: (Discussed risks of anesthesia with patient, including possibility of difficulty with spontaneous ventilation under anesthesia necessitating  airway intervention, PONV, and rare risks such as cardiac or respiratory or neurological events, and allergic reactions. Discussed the role of CRNA in patient's perioperative care. Patient understands.)        Anesthesia Quick Evaluation

## 2022-11-30 ENCOUNTER — Encounter: Payer: Self-pay | Admitting: Internal Medicine

## 2022-11-30 LAB — SURGICAL PATHOLOGY

## 2022-12-05 ENCOUNTER — Ambulatory Visit
Admission: RE | Admit: 2022-12-05 | Discharge: 2022-12-05 | Disposition: A | Discharge status: Home / Self Care | Payer: Medicare HMO | Source: Ambulatory Visit | Attending: Surgery | Admitting: Surgery

## 2022-12-05 DIAGNOSIS — R1310 Dysphagia, unspecified: Secondary | ICD-10-CM | POA: Diagnosis not present

## 2022-12-05 DIAGNOSIS — N200 Calculus of kidney: Secondary | ICD-10-CM | POA: Diagnosis not present

## 2022-12-05 DIAGNOSIS — K449 Diaphragmatic hernia without obstruction or gangrene: Secondary | ICD-10-CM | POA: Diagnosis not present

## 2022-12-05 DIAGNOSIS — K5730 Diverticulosis of large intestine without perforation or abscess without bleeding: Secondary | ICD-10-CM | POA: Diagnosis not present

## 2022-12-05 DIAGNOSIS — J4540 Moderate persistent asthma, uncomplicated: Secondary | ICD-10-CM | POA: Diagnosis not present

## 2022-12-05 DIAGNOSIS — K760 Fatty (change of) liver, not elsewhere classified: Secondary | ICD-10-CM | POA: Diagnosis not present

## 2022-12-05 MED ORDER — IOHEXOL 300 MG/ML  SOLN
100.0000 mL | Freq: Once | INTRAMUSCULAR | Status: AC | PRN
  Administered 2022-12-05: 100 mL via INTRAVENOUS

## 2022-12-13 ENCOUNTER — Ambulatory Visit (INDEPENDENT_AMBULATORY_CARE_PROVIDER_SITE_OTHER): Payer: Medicare HMO | Admitting: Surgery

## 2022-12-13 ENCOUNTER — Other Ambulatory Visit: Payer: Self-pay

## 2022-12-13 ENCOUNTER — Encounter: Payer: Self-pay | Admitting: Surgery

## 2022-12-13 ENCOUNTER — Ambulatory Visit: Payer: Medicare HMO | Admitting: Surgery

## 2022-12-13 VITALS — BP 122/86 | HR 90 | Temp 98.4°F | Ht 62.0 in | Wt 148.0 lb

## 2022-12-13 DIAGNOSIS — K449 Diaphragmatic hernia without obstruction or gangrene: Secondary | ICD-10-CM | POA: Diagnosis not present

## 2022-12-13 DIAGNOSIS — R1310 Dysphagia, unspecified: Secondary | ICD-10-CM

## 2022-12-13 DIAGNOSIS — M7540 Impingement syndrome of unspecified shoulder: Secondary | ICD-10-CM | POA: Insufficient documentation

## 2022-12-13 DIAGNOSIS — M19049 Primary osteoarthritis, unspecified hand: Secondary | ICD-10-CM | POA: Insufficient documentation

## 2022-12-13 NOTE — Patient Instructions (Addendum)
We will call you in July 2024 to schedule an appointment.   Hiatal Hernia   A hiatal hernia occurs when part of the stomach slides above the muscle that separates the abdomen from the chest (diaphragm). A person can be born with a hiatal hernia (congenital), or it may develop over time. In almost all cases of hiatal hernia, only the top part of the stomach pushes through the diaphragm. Many people have a hiatal hernia with no symptoms. The larger the hernia, the more likely it is that you will have symptoms. In some cases, a hiatal hernia allows stomach acid to flow back into the tube that carries food from your mouth to your stomach (esophagus). This may cause heartburn symptoms. The development of heartburn symptoms may mean that you have a condition called gastroesophageal reflux disease (GERD). What are the causes? This condition is caused by a weakness in the opening (hiatus) where the esophagus passes through the diaphragm to attach to the upper part of the stomach. A person may be born with a weakness in the hiatus, or a weakness can develop over time. What increases the risk? This condition is more likely to develop in: Older people. Age is a major risk factor for a hiatal hernia, especially if you are over the age of 1. Pregnant women. People who are overweight. People who have frequent constipation. What are the signs or symptoms? Symptoms of this condition usually develop in the form of GERD symptoms. Symptoms include: Heartburn. Upset stomach (indigestion). Trouble swallowing. Coughing or wheezing. Wheezing is making high-pitched whistling sounds when you breathe. Sore throat. Chest pain. Nausea and vomiting. How is this diagnosed? This condition may be diagnosed during testing for GERD. Tests that may be done include: X-rays of your stomach or chest. An upper gastrointestinal (GI) series. This is an X-ray exam of your GI tract that is taken after you swallow a chalky liquid  that shows up clearly on the X-ray. Endoscopy. This is a procedure to look into your stomach using a thin, flexible tube that has a tiny camera and light on the end of it. How is this treated? This condition may be treated by: Dietary and lifestyle changes to help reduce GERD symptoms. Medicines. These may include: Over-the-counter antacids. Medicines that make your stomach empty more quickly. Medicines that block the production of stomach acid (H2 blockers). Stronger medicines to reduce stomach acid (proton pump inhibitors). Surgery to repair the hernia, if other treatments are not helping. If you have no symptoms, you may not need treatment. Follow these instructions at home: Lifestyle and activity Do not use any products that contain nicotine or tobacco. These products include cigarettes, chewing tobacco, and vaping devices, such as e-cigarettes. If you need help quitting, ask your health care provider. Try to achieve and maintain a healthy body weight. Avoid putting pressure on your abdomen. Anything that puts pressure on your abdomen increases the amount of acid that may be pushed up into your esophagus. Avoid bending over, especially after eating. Raise the head of your bed by putting blocks under the legs. This keeps your head and esophagus higher than your stomach. Do not wear tight clothing around your chest or stomach. Try not to strain when having a bowel movement, when urinating, or when lifting heavy objects. Eating and drinking Avoid foods that can worsen GERD symptoms. These may include: Fatty foods, like fried foods. Citrus fruits, like oranges or lemon. Other foods and drinks that contain acid, like orange juice or  tomatoes. Spicy food. Chocolate. Eat frequent small meals instead of three large meals a day. This helps prevent your stomach from getting too full. Eat slowly. Do not lie down right after eating. Do not eat 1-2 hours before bed. Do not drink beverages with  caffeine. These include cola, coffee, cocoa, and tea. Do not drink alcohol. General instructions Take over-the-counter and prescription medicines only as told by your health care provider. Keep all follow-up visits. Your health care provider will want to check that any new prescribed medicines are helping your symptoms. Contact a health care provider if: Your symptoms are not controlled with medicines or lifestyle changes. You are having trouble swallowing. You have coughing or wheezing that will not go away. Your pain is getting worse. Your pain spreads to your arms, neck, jaw, teeth, or back. You feel nauseous or you vomit. Get help right away if: You have shortness of breath. You vomit blood. You have bright red blood in your stools. You have black, tarry stools. These symptoms may be an emergency. Get help right away. Call 911. Do not wait to see if the symptoms will go away. Do not drive yourself to the hospital. Summary A hiatal hernia occurs when part of the stomach slides above the muscle that separates the abdomen from the chest. A person may be born with a weakness in the hiatus, or a weakness can develop over time. Symptoms of a hiatal hernia may include heartburn, trouble swallowing, or sore throat. Management of a hiatal hernia includes eating frequent small meals instead of three large meals a day. Get help right away if you vomit blood, have bright red blood in your stools, or have black, tarry stools. This information is not intended to replace advice given to you by your health care provider. Make sure you discuss any questions you have with your health care provider. Document Revised: 01/17/2022 Document Reviewed: 01/17/2022 Elsevier Patient Education  [place].

## 2022-12-15 DIAGNOSIS — D519 Vitamin B12 deficiency anemia, unspecified: Secondary | ICD-10-CM | POA: Diagnosis not present

## 2022-12-15 DIAGNOSIS — I10 Essential (primary) hypertension: Secondary | ICD-10-CM | POA: Diagnosis not present

## 2022-12-15 DIAGNOSIS — E785 Hyperlipidemia, unspecified: Secondary | ICD-10-CM | POA: Diagnosis not present

## 2022-12-15 DIAGNOSIS — E119 Type 2 diabetes mellitus without complications: Secondary | ICD-10-CM | POA: Diagnosis not present

## 2022-12-15 NOTE — Progress Notes (Signed)
Outpatient Surgical Follow Up  12/15/2022  MOLINA HOLLENBACK is an 57 y.o. female.   Chief Complaint  Patient presents with  . Follow-up    Hiatal hernia     HPI: ***  Past Medical History:  Diagnosis Date  . Anxiety   . Chronic headaches   . Depression   . Dermatophytosis of nail   . Diabetes mellitus without complication ([place])   . GERD (gastroesophageal reflux disease)   . High cholesterol   . History of kidney stones   . Hypertension   . Mental developmental delay   . Onychia of toe   . Panic attacks     Past Surgical History:  Procedure Laterality Date  . COLONOSCOPY WITH PROPOFOL N/A 10/09/2017   Procedure: COLONOSCOPY WITH PROPOFOL;  Surgeon: Toledo, Benay Pike, MD;  Location: [facility];  Service: Gastroenterology;  Laterality: N/A;  . COLONOSCOPY WITH PROPOFOL N/A 02/06/2018   Procedure: COLONOSCOPY WITH PROPOFOL;  Surgeon: Toledo, Benay Pike, MD;  Location: [facility];  Service: Gastroenterology;  Laterality: N/A;  . ESOPHAGOGASTRODUODENOSCOPY (EGD) WITH PROPOFOL N/A 10/09/2017   Procedure: ESOPHAGOGASTRODUODENOSCOPY (EGD) WITH PROPOFOL;  Surgeon: Toledo, Benay Pike, MD;  Location: [facility];  Service: Gastroenterology;  Laterality: N/A;  . ESOPHAGOGASTRODUODENOSCOPY (EGD) WITH PROPOFOL N/A 07/31/2018   Procedure: ESOPHAGOGASTRODUODENOSCOPY (EGD) WITH PROPOFOL;  Surgeon: Toledo, Benay Pike, MD;  Location: [facility];  Service: Gastroenterology;  Laterality: N/A;  . ESOPHAGOGASTRODUODENOSCOPY (EGD) WITH PROPOFOL N/A 11/29/2022   Procedure: ESOPHAGOGASTRODUODENOSCOPY (EGD) WITH PROPOFOL;  Surgeon: Toledo, Benay Pike, MD;  Location: [facility];  Service: Gastroenterology;  Laterality: N/A;  . HAND SURGERY    . KIDNEY STONE SURGERY      Family History  Problem Relation Age of Onset  . Breast cancer Mother 74       pre pt.   . Diabetes Father   . Diabetes Paternal Aunt   . Diabetes Paternal Uncle   . Diabetes Paternal Grandmother   . Diabetes Paternal  Grandfather     Social History:  reports that she has never smoked. She has never used smokeless tobacco. She reports that she does not currently use alcohol. She reports that she does not use drugs.  Allergies: No Known Allergies  Medications reviewed.    ROS Full ROS performed and is otherwise negative other than what is stated in HPI   BP 122/86   Pulse 90   Temp 98.4 F (36.9 C) (Oral)   Ht '5\' 2"'$  (1.575 m)   Wt 148 lb (67.1 kg)   SpO2 98%   BMI 27.07 kg/m   Physical Exam     No results found for this or any previous visit (from the past 48 hour(s)). No results found.  Assessment/Plan:   Caroleen Hamman, MD [facility] Surgeon

## 2022-12-19 ENCOUNTER — Ambulatory Visit
Admission: RE | Admit: 2022-12-19 | Discharge: 2022-12-19 | Disposition: A | Discharge status: Home / Self Care | Payer: Medicare HMO | Source: Ambulatory Visit | Attending: Nurse Practitioner | Admitting: Nurse Practitioner

## 2022-12-19 DIAGNOSIS — Z1231 Encounter for screening mammogram for malignant neoplasm of breast: Secondary | ICD-10-CM | POA: Diagnosis not present

## 2022-12-19 DIAGNOSIS — F411 Generalized anxiety disorder: Secondary | ICD-10-CM | POA: Diagnosis not present

## 2022-12-19 DIAGNOSIS — I10 Essential (primary) hypertension: Secondary | ICD-10-CM | POA: Diagnosis not present

## 2022-12-19 DIAGNOSIS — F319 Bipolar disorder, unspecified: Secondary | ICD-10-CM | POA: Diagnosis not present

## 2022-12-19 DIAGNOSIS — J4540 Moderate persistent asthma, uncomplicated: Secondary | ICD-10-CM | POA: Diagnosis not present

## 2022-12-19 DIAGNOSIS — E119 Type 2 diabetes mellitus without complications: Secondary | ICD-10-CM | POA: Diagnosis not present

## 2022-12-19 DIAGNOSIS — E785 Hyperlipidemia, unspecified: Secondary | ICD-10-CM | POA: Diagnosis not present

## 2023-01-02 DIAGNOSIS — J4540 Moderate persistent asthma, uncomplicated: Secondary | ICD-10-CM | POA: Diagnosis not present

## 2023-01-08 ENCOUNTER — Other Ambulatory Visit: Payer: Self-pay | Admitting: Cardiovascular Disease

## 2023-01-16 DIAGNOSIS — J4540 Moderate persistent asthma, uncomplicated: Secondary | ICD-10-CM | POA: Diagnosis not present

## 2023-01-23 DIAGNOSIS — F339 Major depressive disorder, recurrent, unspecified: Secondary | ICD-10-CM | POA: Diagnosis not present

## 2023-01-23 DIAGNOSIS — E119 Type 2 diabetes mellitus without complications: Secondary | ICD-10-CM | POA: Diagnosis not present

## 2023-01-23 DIAGNOSIS — H53032 Strabismic amblyopia, left eye: Secondary | ICD-10-CM | POA: Diagnosis not present

## 2023-01-23 DIAGNOSIS — Z0100 Encounter for examination of eyes and vision without abnormal findings: Secondary | ICD-10-CM | POA: Diagnosis not present

## 2023-01-31 ENCOUNTER — Encounter: Payer: Self-pay | Admitting: Nurse Practitioner

## 2023-02-06 DIAGNOSIS — J4540 Moderate persistent asthma, uncomplicated: Secondary | ICD-10-CM | POA: Diagnosis not present

## 2023-02-13 ENCOUNTER — Encounter: Payer: Self-pay | Admitting: Cardiovascular Disease

## 2023-02-13 ENCOUNTER — Ambulatory Visit (INDEPENDENT_AMBULATORY_CARE_PROVIDER_SITE_OTHER): Payer: Medicare HMO | Admitting: Cardiovascular Disease

## 2023-02-13 VITALS — BP 104/80 | HR 89 | Ht 62.0 in | Wt 144.4 lb

## 2023-02-13 DIAGNOSIS — E782 Mixed hyperlipidemia: Secondary | ICD-10-CM

## 2023-02-13 DIAGNOSIS — G4730 Sleep apnea, unspecified: Secondary | ICD-10-CM | POA: Diagnosis not present

## 2023-02-13 DIAGNOSIS — I10 Essential (primary) hypertension: Secondary | ICD-10-CM | POA: Diagnosis not present

## 2023-02-13 NOTE — Progress Notes (Signed)
Cardiology Office Note   Date:  02/13/2023   ID:  Yohana, Tasker 01-14-1966, MRN [id number]  PCP:  Evern Bio, NP  Cardiologist:  Neoma Laming, MD      History of Present Illness: Hannah Clarke is a 57 y.o. female who presents for  Chief Complaint  Patient presents with   Follow-up    3 month follow up    Patient in office for routine cardiac exam. Denies chest pain, shortness of breath, edema, palpitations. Patient complains of acid reflux related to hernia. Hernia repair cancelled by surgeon.      Past Medical History:  Diagnosis Date   Anxiety    Chronic headaches    Depression    Dermatophytosis of nail    Diabetes mellitus without complication (HCC)    GERD (gastroesophageal reflux disease)    High cholesterol    History of kidney stones    Hypertension    Mental developmental delay    Onychia of toe    Panic attacks      Past Surgical History:  Procedure Laterality Date   COLONOSCOPY WITH PROPOFOL N/A 10/09/2017   Procedure: COLONOSCOPY WITH PROPOFOL;  Surgeon: Toledo, Benay Pike, MD;  Location: [facility];  Service: Gastroenterology;  Laterality: N/A;   COLONOSCOPY WITH PROPOFOL N/A 02/06/2018   Procedure: COLONOSCOPY WITH PROPOFOL;  Surgeon: Toledo, Benay Pike, MD;  Location: [facility];  Service: Gastroenterology;  Laterality: N/A;   ESOPHAGOGASTRODUODENOSCOPY (EGD) WITH PROPOFOL N/A 10/09/2017   Procedure: ESOPHAGOGASTRODUODENOSCOPY (EGD) WITH PROPOFOL;  Surgeon: Toledo, Benay Pike, MD;  Location: [facility];  Service: Gastroenterology;  Laterality: N/A;   ESOPHAGOGASTRODUODENOSCOPY (EGD) WITH PROPOFOL N/A 07/31/2018   Procedure: ESOPHAGOGASTRODUODENOSCOPY (EGD) WITH PROPOFOL;  Surgeon: Toledo, Benay Pike, MD;  Location: [facility];  Service: Gastroenterology;  Laterality: N/A;   ESOPHAGOGASTRODUODENOSCOPY (EGD) WITH PROPOFOL N/A 11/29/2022   Procedure: ESOPHAGOGASTRODUODENOSCOPY (EGD) WITH PROPOFOL;  Surgeon: Toledo, Benay Pike, MD;   Location: [facility];  Service: Gastroenterology;  Laterality: N/A;   HAND SURGERY     KIDNEY STONE SURGERY       Current Outpatient Medications  Medication Sig Dispense Refill   albuterol (PROVENTIL HFA;VENTOLIN HFA) 108 (90 BASE) MCG/ACT inhaler Inhale 2 puffs into the lungs every 6 (six) hours as needed for wheezing or shortness of breath. 1 Inhaler 2   azelastine (ASTELIN) 0.1 % nasal spray      budesonide (PULMICORT) 0.5 MG/2ML nebulizer solution Inhale into the lungs.     buPROPion (WELLBUTRIN XL) 300 MG 24 hr tablet      busPIRone (BUSPAR) 30 MG tablet      cetirizine (ZYRTEC) 10 MG tablet Take 10 mg by mouth daily.     cholecalciferol (VITAMIN D) 1000 units tablet Take 1,000 Units by mouth daily.     citalopram (CELEXA) 10 MG tablet Take 10 mg by mouth daily.     dexlansoprazole (DEXILANT) 60 MG capsule Take 60 mg by mouth daily.     Dulaglutide 0.75 MG/0.5ML SOPN Inject into the skin.     Dupilumab 300 MG/2ML SOPN Inject into the skin.     EPINEPHrine 0.3 mg/0.3 mL IJ SOAJ injection      fluticasone (FLONASE) 50 MCG/ACT nasal spray      Fluticasone-Umeclidin-Vilant (TRELEGY ELLIPTA) 200-62.5-25 MCG/ACT AEPB Inhale into the lungs.     gabapentin (NEURONTIN) 400 MG capsule      ipratropium (ATROVENT) 0.06 % nasal spray      lactulose (CHRONULAC) 10 GM/15ML solution Take 30 mLs (  20 g total) by mouth daily as needed for mild constipation. 120 mL 0   LINZESS 290 MCG CAPS capsule      LORazepam (ATIVAN) 1 MG tablet Take 1 tablet (1 mg total) by mouth every 8 (eight) hours as needed for anxiety. 15 tablet 0   losartan (COZAAR) 100 MG tablet      lubiprostone (AMITIZA) 24 MCG capsule Take by mouth.     medroxyPROGESTERone (DEPO-PROVERA) 150 MG/ML injection Inject 150 mg into the muscle every 3 (three) months.     metFORMIN (GLUCOPHAGE) 500 MG tablet Take 2 tablets by mouth 2 (two) times daily.     MODERNA COVID-19 VACCINE 100 MCG/0.5ML injection      montelukast (SINGULAIR) 10 MG  tablet Take 10 mg by mouth daily.     nortriptyline (PAMELOR) 50 MG capsule      ondansetron (ZOFRAN) 4 MG tablet Take 1 tablet by mouth every 8 (eight) hours as needed.     pantoprazole (PROTONIX) 40 MG tablet TAKE 1 TABLET BY MOUTH EVERY DAY 30 tablet 2   Peppermint Oil (IBGARD) 90 MG CPCR TAKE 2 CAPSULES BY MOUTH 2 TIMES A DAY BEFORE MEALS     polyethylene glycol (MIRALAX MIX-IN PAX) 17 g packet Take 17 g by mouth daily. Take 3 times a day for 2 days, then 2 times a day for one week. Then may reduce to 1-2 times a day as needed. 14 each 0   potassium chloride SA (K-DUR,KLOR-CON) 20 MEQ tablet Take 20 mEq by mouth daily.     rosuvastatin (CRESTOR) 10 MG tablet      sennosides-docusate sodium (SENOKOT-S) 8.6-50 MG tablet Take 1 tablet by mouth daily.     sucralfate (CARAFATE) 1 g tablet Take 1 g by mouth 4 (four) times daily -  with meals and at bedtime.     TRUE METRIX BLOOD GLUCOSE TEST test strip 1 each 2 (two) times daily.     acetaminophen (TYLENOL) 325 MG tablet Take 325 mg by mouth every 6 (six) hours as needed. (Patient not taking: Reported on 02/13/2023)     benzonatate (TESSALON) 100 MG capsule TAKE 1 CAPSULE BY MOUTH EVERY 8 HOURS ASNEEDED FOR COUGH (Patient not taking: Reported on 02/13/2023)     famotidine (PEPCID) 40 MG tablet  (Patient not taking: Reported on 02/13/2023)     polyethylene glycol powder (GLYCOLAX/MIRALAX) 17 GM/SCOOP powder MIX 34 GRAMS(2 CAPFULS) IN 8 OUNCES OF LIQUID OF CHOICE AND DRINK BY MOUTH DAILY IN THE MORNING (Patient not taking: Reported on 02/13/2023)     simethicone (MYLICON) 0000000 MG chewable tablet TAKE 1 TABLET(CHEW) BY MOUTH EVERY 6 HOURS AS NEEDED FOR FLATULENCE (Patient not taking: Reported on 02/13/2023)     No current facility-administered medications for this visit.    Allergies:   Patient has no known allergies.    Social History:   reports that she has never smoked. She has never used smokeless tobacco. She reports that she does not currently use  alcohol. She reports that she does not use drugs.   Family History:  family history includes Breast cancer (age of onset: 27) in her mother; Diabetes in her father, paternal aunt, paternal grandfather, paternal grandmother, and paternal uncle.    ROS:     Review of Systems  Constitutional: Negative.   HENT: Negative.    Eyes: Negative.   Respiratory: Negative.    Cardiovascular: Negative.   Gastrointestinal: Negative.   Genitourinary: Negative.   Musculoskeletal: Negative.   Skin:  Negative.   Neurological: Negative.   Endo/Heme/Allergies: Negative.   Psychiatric/Behavioral: Negative.    All other systems reviewed and are negative.   All other systems are reviewed and negative.   PHYSICAL EXAM: VS:  BP 104/80   Pulse 89   Ht '5\' 2"'$  (1.575 m)   Wt 144 lb 6.4 oz (65.5 kg)   SpO2 98%   BMI 26.41 kg/m  , BMI Body mass index is 26.41 kg/m. Last weight:  Wt Readings from Last 3 Encounters:  02/13/23 144 lb 6.4 oz (65.5 kg)  12/13/22 148 lb (67.1 kg)  11/29/22 146 lb 4.8 oz (66.4 kg)     Physical Exam Constitutional:      Appearance: Normal appearance.  Cardiovascular:     Rate and Rhythm: Normal rate and regular rhythm.     Heart sounds: Normal heart sounds.  Pulmonary:     Effort: Pulmonary effort is normal.     Breath sounds: Normal breath sounds.  Musculoskeletal:     Right lower leg: No edema.     Left lower leg: No edema.  Neurological:     Mental Status: She is alert.     EKG: none today  Recent Labs: 10/03/2022: ALT 13; BUN 12; Creatinine, Ser 0.44; Potassium 3.5; Sodium 140    Lipid Panel    Component Value Date/Time   CHOL 122 08/18/2017 0450   TRIG 271 (H) 08/18/2017 0450   HDL 35 (L) 08/18/2017 0450   CHOLHDL 3.5 08/18/2017 0450   VLDL 54 (H) 08/18/2017 0450   LDLCALC 33 08/18/2017 0450      Other studies Reviewed: Patient: 1426.0 - Leda Gauze DOB:  [birth year] Date:  04/04/2022 08:00 Provider: Neoma Laming MD Encounter: NUCLEAR  STRESS TEST   Page 1 TESTS                                                                                          [facility] [address] [place], [place] [address] [phone] STUDY:  Gated Stress / Rest Myocardial Perfusion Imaging Tomographic (SPECT) Including attenuation correction Wall Motion, Left Ventricular Ejection Fraction By Gated Technique.Treadmill Stress Test. SEX: Female  WEIGHT: 150 lbs  HEIGHT: 62 in    ARMS UP: YES/NO                                                                        REFERRING PHYSICIAN: Dr.Dashel Goines Humphrey Rolls  INDICATION FOR STUDY: CP                                                                                                                                                                                                                     TECHNIQUE:  Approximately 20 minutes following the intravenous administration of 10.4 mCi of Tc-79mSestamibi after stress testing in a reclined supine position with arms above their head if able to do so, gated SPECT imaging of the heart was performed. After about a 2hr break, the patient was injected intravenously with 32.0 mCi of Tc-9110mestamibi.  Approximately 45 minutes later in the same position as stress imaging SPECT rest imaging of the heart was performed.  STRESS BY:  ShNeoma LamingMD PROTOCOL: BrDarnell Level                                                                                       MAX PRED HR: 166                     85%: 141               75%: 125                                                                                                                   RESTING BP: 122/70   RESTING HR: 98  PEAK BP:  154/88  PEAK HR: 134 (83%)  EXERCISE DURATION:  4:00                                            METS: 5.0     REASON FOR TEST TERMINATION:  Fatigue. SOB.                                                                                                                                 SYMPTOMS: Fatigue. SOB.  [place] TREADMILL SCORE:  4                                      RISK: Low                                                                                                                                                                                                            EKG RESULTS: NSR. 99/min. No significant ST changes at peak exercise.                                                              IMAGE QUALITY: Good  PERFUSION/WALL MOTION FINDINGS: EF = 79%. No perfusion defects, normal wall motion.                                                                           IMPRESSION: Normal stress test with normal LVEF.                                                                                                                                                                                                                                                                                         Neoma Laming, MD Stress Interpreting Physician / Nuclear Interpreting Physician  Neoma Laming MD  Electronically signed by: Neoma Laming     Date: 04/07/2022 09:55  Patient: 1426.0 - Leda Gauze DOB:  06-19-1966  Date:  02/21/2022 10:00 Provider: Neoma Laming MD Encounter: ECHO   Page 2 REASON FOR VISIT  Visit for: Echocardiogram/I 10  Sex:   Female   wt= 160   lbs.  BP=136/80   Height= 63   inches.    TESTS  Imaging: Echocardiogram:  An echocardiogram in (2-d) mode was performed and in Doppler mode with color flow velocity mapping was performed. The aortic valve cusps are abnormal 1.1   cm, flow velocity 1.3    m/s, and systolic calculated mean flow gradient 5  mmHg. Mitral valve diastolic peak flow velocity E .612   m/s and E/A ratio 0.6. Aortic root diameter 2.9   cm. The LVOT internal diameter 2.6   cm and flow velocity was abnormal 1.0   m/s. LV systolic dimension 99991111  cm, diastolic Q000111Q   cm, posterior wall thickness 1.18   cm, fractional shortening 52.8 %, and EF 84.8  %. IVS thickness 1.22   cm. LA dimension 3.3 cm. Mitral Valve has Trace Regurgitation. Pulmonic Valve has Trace Regurgitation. Tricuspid Valve has Trace Regurgitation.  ASSESSMENT  Technically adequate study.  Normal chamber sizes.  Normal left ventricular systolic function.  Mild left ventricular hypertrophy with GRADE 1 (relaxation abnormality) diastolic dysfunction.  Normal right ventricular systolic function.  Normal right ventricular diastolic function.  Normal left ventricular wall motion.  Normal right ventricular wall motion.  Trace pulmonary regurgitation.  Trace tricuspid regurgitation.  Normal pulmonary artery pressure.  Trace mitral regurgitation.  No pericardial effusion.  Mild LVH.   THERAPY   Referring physician: Elsworth Soho FNP-C  Sonographer: Neoma Laming.   Neoma Laming MD  Electronically signed by: Neoma Laming     Date: 02/22/2022 09:27  Patient: 1426.0 - Leda Gauze DOB:  07-15-1966  Date:  08/16/2017 13:00 Provider: Neoma Laming MD Encounter: ALL ANGIOGRAMS (CTA BRAIN, CAROTIDS, RENAL ARTERIES, PE)   Page 2 REASON FOR VISIT  Referred by Neoma Laming.    TESTS  Imaging: Computed Tomographic Angiography:  Cardiac multidetector CT was performed paying particular attention to the coronary arteries for the diagnosis of: Chest  pain. Diagnostic Drugs:  Administered iohexol (Omnipaque) through an antecubital vein and images from the examination were analyzed for the presence and extent of coronary artery disease, using 3D image processing software. 100 mL of non-ionic contrast (Omnipaque) was used.    TEST CONCLUSIONS  Quality of study: Excellent    1 - Calcium score is 0.  2 - Right dominant system.  3 - Normal coronaries.   Neoma Laming MD  Electronically signed by: Neoma Laming     Date: 08/20/2017 09:18   ASSESSMENT AND PLAN:    ICD-10-CM   1. Benign essential hypertension  I10     2. Mixed hyperlipidemia  E78.2     3. Sleep apnea, unspecified type  G47.30        Problem List Items Addressed This Visit       Cardiovascular and Mediastinum   Benign essential hypertension - Primary    Well controlled on current medications.         Respiratory   Sleep apnea     Other   Mixed hyperlipidemia    Disposition:   Return in about 6 months (around 08/16/2023).    Total time spent: 30 minutes  Signed,  Neoma Laming, MD  02/13/2023 10:01 AM    [place]

## 2023-02-13 NOTE — Assessment & Plan Note (Signed)
Well-controlled on current medications 

## 2023-02-16 ENCOUNTER — Ambulatory Visit: Payer: Medicare HMO | Admitting: Nurse Practitioner

## 2023-02-20 DIAGNOSIS — R519 Headache, unspecified: Secondary | ICD-10-CM | POA: Diagnosis not present

## 2023-02-20 DIAGNOSIS — G479 Sleep disorder, unspecified: Secondary | ICD-10-CM | POA: Diagnosis not present

## 2023-02-20 DIAGNOSIS — J4540 Moderate persistent asthma, uncomplicated: Secondary | ICD-10-CM | POA: Diagnosis not present

## 2023-02-20 DIAGNOSIS — H53149 Visual discomfort, unspecified: Secondary | ICD-10-CM | POA: Diagnosis not present

## 2023-02-23 ENCOUNTER — Ambulatory Visit: Payer: Medicare HMO | Admitting: Nurse Practitioner

## 2023-02-27 ENCOUNTER — Encounter: Payer: Self-pay | Admitting: Nurse Practitioner

## 2023-02-27 ENCOUNTER — Ambulatory Visit (INDEPENDENT_AMBULATORY_CARE_PROVIDER_SITE_OTHER): Payer: Medicare HMO | Admitting: Nurse Practitioner

## 2023-02-27 VITALS — BP 128/86 | HR 109 | Ht 62.0 in | Wt 149.8 lb

## 2023-02-27 DIAGNOSIS — I10 Essential (primary) hypertension: Secondary | ICD-10-CM

## 2023-02-27 DIAGNOSIS — R519 Headache, unspecified: Secondary | ICD-10-CM | POA: Diagnosis not present

## 2023-02-27 DIAGNOSIS — E782 Mixed hyperlipidemia: Secondary | ICD-10-CM | POA: Diagnosis not present

## 2023-02-27 DIAGNOSIS — E1121 Type 2 diabetes mellitus with diabetic nephropathy: Secondary | ICD-10-CM | POA: Diagnosis not present

## 2023-02-27 NOTE — Progress Notes (Signed)
Established Patient Office Visit  Subjective:  Patient ID: Hannah Clarke, female    DOB: 1966-10-24  Age: 57 y.o. MRN: [id number]  Chief Complaint  Patient presents with   Follow-up    [employer] paperwork    Completing [employer] paperwork.    No other concerns at this time.   Past Medical History:  Diagnosis Date   Anxiety    Chronic headaches    Depression    Dermatophytosis of nail    Diabetes mellitus without complication (HCC)    GERD (gastroesophageal reflux disease)    High cholesterol    History of kidney stones    Hypertension    Mental developmental delay    Onychia of toe    Panic attacks     Past Surgical History:  Procedure Laterality Date   COLONOSCOPY WITH PROPOFOL N/A 10/09/2017   Procedure: COLONOSCOPY WITH PROPOFOL;  Surgeon: Toledo, Benay Pike, MD;  Location: [facility];  Service: Gastroenterology;  Laterality: N/A;   COLONOSCOPY WITH PROPOFOL N/A 02/06/2018   Procedure: COLONOSCOPY WITH PROPOFOL;  Surgeon: Toledo, Benay Pike, MD;  Location: [facility];  Service: Gastroenterology;  Laterality: N/A;   ESOPHAGOGASTRODUODENOSCOPY (EGD) WITH PROPOFOL N/A 10/09/2017   Procedure: ESOPHAGOGASTRODUODENOSCOPY (EGD) WITH PROPOFOL;  Surgeon: Toledo, Benay Pike, MD;  Location: [facility];  Service: Gastroenterology;  Laterality: N/A;   ESOPHAGOGASTRODUODENOSCOPY (EGD) WITH PROPOFOL N/A 07/31/2018   Procedure: ESOPHAGOGASTRODUODENOSCOPY (EGD) WITH PROPOFOL;  Surgeon: Toledo, Benay Pike, MD;  Location: [facility];  Service: Gastroenterology;  Laterality: N/A;   ESOPHAGOGASTRODUODENOSCOPY (EGD) WITH PROPOFOL N/A 11/29/2022   Procedure: ESOPHAGOGASTRODUODENOSCOPY (EGD) WITH PROPOFOL;  Surgeon: Toledo, Benay Pike, MD;  Location: [facility];  Service: Gastroenterology;  Laterality: N/A;   HAND SURGERY     KIDNEY STONE SURGERY      Social History   Socioeconomic History   Marital status: Single    Spouse name: Not on file   Number of  children: Not on file   Years of education: Not on file   Highest education level: Not on file  Occupational History   Not on file  Tobacco Use   Smoking status: Never   Smokeless tobacco: Never  Vaping Use   Vaping Use: Never used  Substance and Sexual Activity   Alcohol use: Not Currently    Comment: OCCASSIONALLY   Drug use: No   Sexual activity: Not on file  Other Topics Concern   Not on file  Social History Narrative   Not on file   Social Determinants of Health   Financial Resource Strain: Not on file  Food Insecurity: No Food Insecurity (07/18/2022)   Hunger Vital Sign    Worried About Running Out of Food in the Last Year: Never true    Ran Out of Food in the Last Year: Never true  Transportation Needs: No Transportation Needs (07/18/2022)   PRAPARE - Hydrologist (Medical): No    Lack of Transportation (Non-Medical): No  Physical Activity: Not on file  Stress: Not on file  Social Connections: Not on file  Intimate Partner Violence: Not on file    Family History  Problem Relation Age of Onset   Breast cancer Mother 51       pre pt.    Diabetes Father    Diabetes Paternal Aunt    Diabetes Paternal Uncle    Diabetes Paternal Grandmother    Diabetes Paternal Grandfather     No Known Allergies  Review of Systems  Constitutional: Negative.   HENT: Negative.    Eyes: Negative.   Respiratory: Negative.    Cardiovascular: Negative.   Gastrointestinal: Negative.   Genitourinary: Negative.   Musculoskeletal: Negative.   Skin: Negative.   Neurological: Negative.   Endo/Heme/Allergies: Negative.   Psychiatric/Behavioral: Negative.         Objective:   BP 128/86   Pulse (!) 109   Ht 5\' 2"  (1.575 m)   Wt 149 lb 12.8 oz (67.9 kg)   SpO2 96%   BMI 27.40 kg/m   Vitals:   02/27/23 1506  BP: 128/86  Pulse: (!) 109  Height: 5\' 2"  (1.575 m)  Weight: 149 lb 12.8 oz (67.9 kg)  SpO2: 96%  BMI (Calculated): 27.39     Physical Exam Vitals reviewed.  Constitutional:      Appearance: Normal appearance.  HENT:     Head: Normocephalic.     Nose: Nose normal.     Mouth/Throat:     Mouth: Mucous membranes are moist.  Eyes:     Pupils: Pupils are equal, round, and reactive to light.  Cardiovascular:     Rate and Rhythm: Normal rate and regular rhythm.  Pulmonary:     Effort: Pulmonary effort is normal.     Breath sounds: Normal breath sounds.  Abdominal:     General: Bowel sounds are normal.     Palpations: Abdomen is soft.  Musculoskeletal:        General: Normal range of motion.     Cervical back: Normal range of motion and neck supple.  Skin:    General: Skin is warm and dry.  Neurological:     Mental Status: She is alert and oriented to person, place, and time.  Psychiatric:        Mood and Affect: Mood normal.        Behavior: Behavior normal.      No results found for any visits on 02/27/23.  No results found for this or any previous visit (from the past 2160 hour(s)).    Assessment & Plan:   Problem List Items Addressed This Visit       Cardiovascular and Mediastinum   Benign essential hypertension - Primary     Endocrine   Type 2 diabetes mellitus with diabetic nephropathy ([place])     Other   Headache disorder   Mixed hyperlipidemia    Return in about 3 months (around 05/30/2023).   Total time spent: 40 minutes  Evern Bio, NP  02/27/2023

## 2023-02-27 NOTE — Patient Instructions (Signed)
1) Completed [employer] paperwork 2) Follow up appt in 3 months, fasting labs prior

## 2023-02-28 ENCOUNTER — Telehealth: Payer: Self-pay

## 2023-02-28 NOTE — Telephone Encounter (Signed)
patient LM asking for call back about a shot

## 2023-03-06 ENCOUNTER — Ambulatory Visit: Payer: Medicare HMO

## 2023-03-09 ENCOUNTER — Ambulatory Visit (INDEPENDENT_AMBULATORY_CARE_PROVIDER_SITE_OTHER): Payer: Medicare HMO | Admitting: Family

## 2023-03-09 DIAGNOSIS — Z309 Encounter for contraceptive management, unspecified: Secondary | ICD-10-CM | POA: Diagnosis not present

## 2023-03-09 MED ORDER — MEDROXYPROGESTERONE ACETATE 150 MG/ML IM SUSY: PREFILLED_SYRINGE | Freq: Once | INTRAMUSCULAR | Status: AC

## 2023-03-20 DIAGNOSIS — J4540 Moderate persistent asthma, uncomplicated: Secondary | ICD-10-CM | POA: Diagnosis not present

## 2023-04-03 DIAGNOSIS — J4540 Moderate persistent asthma, uncomplicated: Secondary | ICD-10-CM | POA: Diagnosis not present

## 2023-04-10 DIAGNOSIS — B351 Tinea unguium: Secondary | ICD-10-CM | POA: Diagnosis not present

## 2023-04-10 DIAGNOSIS — M79674 Pain in right toe(s): Secondary | ICD-10-CM | POA: Diagnosis not present

## 2023-04-10 DIAGNOSIS — M79675 Pain in left toe(s): Secondary | ICD-10-CM | POA: Diagnosis not present

## 2023-04-11 ENCOUNTER — Other Ambulatory Visit: Payer: Self-pay | Admitting: Cardiovascular Disease

## 2023-04-17 ENCOUNTER — Ambulatory Visit: Payer: Medicare HMO | Admitting: Nurse Practitioner

## 2023-04-17 ENCOUNTER — Other Ambulatory Visit: Payer: Medicare HMO

## 2023-04-17 ENCOUNTER — Other Ambulatory Visit: Payer: Self-pay | Admitting: Nurse Practitioner

## 2023-04-17 DIAGNOSIS — E119 Type 2 diabetes mellitus without complications: Secondary | ICD-10-CM | POA: Diagnosis not present

## 2023-04-17 DIAGNOSIS — E785 Hyperlipidemia, unspecified: Secondary | ICD-10-CM | POA: Diagnosis not present

## 2023-04-17 DIAGNOSIS — I10 Essential (primary) hypertension: Secondary | ICD-10-CM | POA: Diagnosis not present

## 2023-04-18 LAB — HGB A1C W/O EAG: Hgb A1c MFr Bld: 7.2 % — ABNORMAL HIGH (ref 4.8–5.6)

## 2023-04-18 LAB — COMPREHENSIVE METABOLIC PANEL
ALT: 15 IU/L (ref 0–32)
AST: 9 IU/L (ref 0–40)
Albumin/Globulin Ratio: 2.8 — ABNORMAL HIGH (ref 1.2–2.2)
Albumin: 4.8 g/dL (ref 3.8–4.9)
Alkaline Phosphatase: 65 IU/L (ref 44–121)
BUN/Creatinine Ratio: 20 (ref 9–23)
BUN: 12 mg/dL (ref 6–24)
Bilirubin Total: <0.2 mg/dL (ref 0.0–1.2)
CO2: 20 mmol/L (ref 20–29)
Calcium: 9.6 mg/dL (ref 8.7–10.2)
Chloride: 105 mmol/L (ref 96–106)
Creatinine, Ser: 0.59 mg/dL (ref 0.57–1.00)
Globulin, Total: 1.7 g/dL (ref 1.5–4.5)
Glucose: 151 mg/dL — ABNORMAL HIGH (ref 70–99)
Potassium: 3.8 mmol/L (ref 3.5–5.2)
Sodium: 142 mmol/L (ref 134–144)
Total Protein: 6.5 g/dL (ref 6.0–8.5)
eGFR: 106 mL/min/{1.73_m2} (ref >59)

## 2023-04-18 LAB — TSH: TSH: 0.845 u[IU]/mL (ref 0.450–4.500)

## 2023-04-18 LAB — LIPID PANEL W/O CHOL/HDL RATIO
Cholesterol, Total: 98 mg/dL — ABNORMAL LOW (ref 100–199)
HDL: 47 mg/dL (ref >39)
LDL Chol Calc (NIH): 32 mg/dL (ref 0–99)
Triglycerides: 98 mg/dL (ref 0–149)
VLDL Cholesterol Cal: 19 mg/dL (ref 5–40)

## 2023-04-20 DIAGNOSIS — J4540 Moderate persistent asthma, uncomplicated: Secondary | ICD-10-CM | POA: Diagnosis not present

## 2023-04-24 ENCOUNTER — Ambulatory Visit: Payer: Medicare HMO | Admitting: Nurse Practitioner

## 2023-05-01 ENCOUNTER — Ambulatory Visit (INDEPENDENT_AMBULATORY_CARE_PROVIDER_SITE_OTHER): Payer: Medicare HMO | Admitting: Nurse Practitioner

## 2023-05-01 VITALS — BP 140/80 | HR 93 | Ht 63.0 in | Wt 145.0 lb

## 2023-05-01 DIAGNOSIS — E119 Type 2 diabetes mellitus without complications: Secondary | ICD-10-CM | POA: Diagnosis not present

## 2023-05-01 DIAGNOSIS — I10 Essential (primary) hypertension: Secondary | ICD-10-CM

## 2023-05-01 DIAGNOSIS — E782 Mixed hyperlipidemia: Secondary | ICD-10-CM

## 2023-05-01 DIAGNOSIS — R110 Nausea: Secondary | ICD-10-CM | POA: Insufficient documentation

## 2023-05-01 NOTE — Progress Notes (Signed)
Established Patient Office Visit  Subjective:  Patient ID: LAKASHA BEATIE, female    DOB: November 24, 1966  Age: 57 y.o. MRN: [id number]  Chief Complaint  Patient presents with   Follow-up    4 Months With Lab Results    Follow up appt to review recent fasting labs, A1c is at 7.2% and LDL is at goal at 32 mg/dl.  Patient feeling well.  Has upcoming GI appt on June 4th, patient states she has nausea that does not resolve.  Patient has no other concerns to discuss today.      No other concerns at this time.   Past Medical History:  Diagnosis Date   Anxiety    Chronic headaches    Depression    Dermatophytosis of nail    Diabetes mellitus without complication (HCC)    GERD (gastroesophageal reflux disease)    High cholesterol    History of kidney stones    Hypertension    Mental developmental delay    Onychia of toe    Panic attacks     Past Surgical History:  Procedure Laterality Date   COLONOSCOPY WITH PROPOFOL N/A 10/09/2017   Procedure: COLONOSCOPY WITH PROPOFOL;  Surgeon: Toledo, Boykin Nearing, MD;  Location: [facility] ENDOSCOPY;  Service: Gastroenterology;  Laterality: N/A;   COLONOSCOPY WITH PROPOFOL N/A 02/06/2018   Procedure: COLONOSCOPY WITH PROPOFOL;  Surgeon: Toledo, Boykin Nearing, MD;  Location: [facility] ENDOSCOPY;  Service: Gastroenterology;  Laterality: N/A;   ESOPHAGOGASTRODUODENOSCOPY (EGD) WITH PROPOFOL N/A 10/09/2017   Procedure: ESOPHAGOGASTRODUODENOSCOPY (EGD) WITH PROPOFOL;  Surgeon: Toledo, Boykin Nearing, MD;  Location: [facility] ENDOSCOPY;  Service: Gastroenterology;  Laterality: N/A;   ESOPHAGOGASTRODUODENOSCOPY (EGD) WITH PROPOFOL N/A 07/31/2018   Procedure: ESOPHAGOGASTRODUODENOSCOPY (EGD) WITH PROPOFOL;  Surgeon: Toledo, Boykin Nearing, MD;  Location: [facility] ENDOSCOPY;  Service: Gastroenterology;  Laterality: N/A;   ESOPHAGOGASTRODUODENOSCOPY (EGD) WITH PROPOFOL N/A 11/29/2022   Procedure: ESOPHAGOGASTRODUODENOSCOPY (EGD) WITH PROPOFOL;  Surgeon: Toledo, Boykin Nearing, MD;  Location: [facility]  ENDOSCOPY;  Service: Gastroenterology;  Laterality: N/A;   HAND SURGERY     KIDNEY STONE SURGERY      Social History   Socioeconomic History   Marital status: Single    Spouse name: Not on file   Number of children: Not on file   Years of education: Not on file   Highest education level: Not on file  Occupational History   Not on file  Tobacco Use   Smoking status: Never   Smokeless tobacco: Never  Vaping Use   Vaping Use: Never used  Substance and Sexual Activity   Alcohol use: Not Currently    Comment: OCCASSIONALLY   Drug use: No   Sexual activity: Not on file  Other Topics Concern   Not on file  Social History Narrative   Not on file   Social Determinants of Health   Financial Resource Strain: Not on file  Food Insecurity: No Food Insecurity (07/18/2022)   Hunger Vital Sign    Worried About Running Out of Food in the Last Year: Never true    Ran Out of Food in the Last Year: Never true  Transportation Needs: No Transportation Needs (07/18/2022)   PRAPARE - Administrator, Civil Service (Medical): No    Lack of Transportation (Non-Medical): No  Physical Activity: Not on file  Stress: Not on file  Social Connections: Not on file  Intimate Partner Violence: Not on file    Family History  Problem Relation Age of Onset   Breast cancer  Mother 41       pre pt.    Diabetes Father    Diabetes Paternal Aunt    Diabetes Paternal Uncle    Diabetes Paternal Grandmother    Diabetes Paternal Grandfather     No Known Allergies  Review of Systems  Constitutional: Negative.   HENT: Negative.    Eyes: Negative.   Respiratory: Negative.    Cardiovascular: Negative.   Gastrointestinal:  Positive for heartburn and nausea.  Genitourinary: Negative.   Musculoskeletal:  Positive for joint pain.  Skin: Negative.   Neurological:  Positive for headaches.  Endo/Heme/Allergies: Negative.   Psychiatric/Behavioral:  The patient is nervous/anxious.         Objective:   BP (!) 140/80   Pulse 93   Ht 5\' 3"  (1.6 m)   Wt 145 lb (65.8 kg)   SpO2 97%   BMI 25.69 kg/m   Vitals:   05/01/23 1035  BP: (!) 140/80  Pulse: 93  Height: 5\' 3"  (1.6 m)  Weight: 145 lb (65.8 kg)  SpO2: 97%  BMI (Calculated): 25.69    Physical Exam Vitals and nursing note reviewed.  Constitutional:      Appearance: Normal appearance.  HENT:     Head: Normocephalic.     Nose: Nose normal.     Mouth/Throat:     Mouth: Mucous membranes are moist.  Eyes:     Pupils: Pupils are equal, round, and reactive to light.  Cardiovascular:     Rate and Rhythm: Normal rate and regular rhythm.  Pulmonary:     Effort: Pulmonary effort is normal.     Breath sounds: Normal breath sounds.  Abdominal:     General: Bowel sounds are normal.     Palpations: Abdomen is soft.  Musculoskeletal:        General: Tenderness present.     Cervical back: Normal range of motion and neck supple.  Skin:    General: Skin is warm and dry.  Neurological:     Mental Status: She is alert and oriented to person, place, and time.  Psychiatric:        Mood and Affect: Mood normal.        Behavior: Behavior normal.      No results found for any visits on 05/01/23.  Recent Results (from the past 2160 hour(s))  Comprehensive metabolic panel     Status: Abnormal   Collection Time: 04/17/23  9:53 AM  Result Value Ref Range   Glucose 151 (H) 70 - 99 mg/dL   BUN 12 6 - 24 mg/dL   Creatinine, Ser 1.61 0.57 - 1.00 mg/dL   eGFR 096 >04 VW/UJW/1.19   BUN/Creatinine Ratio 20 9 - 23   Sodium 142 134 - 144 mmol/L   Potassium 3.8 3.5 - 5.2 mmol/L   Chloride 105 96 - 106 mmol/L   CO2 20 20 - 29 mmol/L   Calcium 9.6 8.7 - 10.2 mg/dL   Total Protein 6.5 6.0 - 8.5 g/dL   Albumin 4.8 3.8 - 4.9 g/dL   Globulin, Total 1.7 1.5 - 4.5 g/dL   Albumin/Globulin Ratio 2.8 (H) 1.2 - 2.2   Bilirubin Total <0.2 0.0 - 1.2 mg/dL   Alkaline Phosphatase 65 44 - 121 IU/L   AST 9 0 - 40 IU/L   ALT 15 0 -  32 IU/L  Lipid Panel w/o Chol/HDL Ratio     Status: Abnormal   Collection Time: 04/17/23  9:53 AM  Result Value Ref Range  Cholesterol, Total 98 (L) 100 - 199 mg/dL   Triglycerides 98 0 - 149 mg/dL   HDL 47 >16 mg/dL   VLDL Cholesterol Cal 19 5 - 40 mg/dL   LDL Chol Calc (NIH) 32 0 - 99 mg/dL  Hgb X0R w/o eAG     Status: Abnormal   Collection Time: 04/17/23  9:53 AM  Result Value Ref Range   Hgb A1c MFr Bld 7.2 (H) 4.8 - 5.6 %    Comment:          Prediabetes: 5.7 - 6.4          Diabetes: >6.4          Glycemic control for adults with diabetes: <7.0   TSH     Status: None   Collection Time: 04/17/23  9:53 AM  Result Value Ref Range   TSH 0.845 0.450 - 4.500 uIU/mL      Assessment & Plan:  1) Slightly stricter diabetic control to achieve 7.0% A1c 2) Congratulated on low LDL 3) Follow up appt in 4 months, fasting labs prior Problem List Items Addressed This Visit       Cardiovascular and Mediastinum   Benign essential hypertension     Endocrine   Diabetes mellitus without complication (HCC)     Other   Mixed hyperlipidemia - Primary   Nausea    No follow-ups on file.   Total time spent: 35 minutes  Orson Eva, NP  05/01/2023   This document may have been prepared by [facility] Voice Recognition software and as such may include unintentional dictation errors.

## 2023-05-08 DIAGNOSIS — K219 Gastro-esophageal reflux disease without esophagitis: Secondary | ICD-10-CM | POA: Diagnosis not present

## 2023-05-08 DIAGNOSIS — J4540 Moderate persistent asthma, uncomplicated: Secondary | ICD-10-CM | POA: Diagnosis not present

## 2023-05-08 DIAGNOSIS — R1314 Dysphagia, pharyngoesophageal phase: Secondary | ICD-10-CM | POA: Diagnosis not present

## 2023-05-08 DIAGNOSIS — K2100 Gastro-esophageal reflux disease with esophagitis, without bleeding: Secondary | ICD-10-CM | POA: Diagnosis not present

## 2023-05-08 DIAGNOSIS — R4189 Other symptoms and signs involving cognitive functions and awareness: Secondary | ICD-10-CM | POA: Diagnosis not present

## 2023-05-08 DIAGNOSIS — J8283 Eosinophilic asthma: Secondary | ICD-10-CM | POA: Diagnosis not present

## 2023-05-08 DIAGNOSIS — K581 Irritable bowel syndrome with constipation: Secondary | ICD-10-CM | POA: Diagnosis not present

## 2023-05-08 DIAGNOSIS — K449 Diaphragmatic hernia without obstruction or gangrene: Secondary | ICD-10-CM | POA: Diagnosis not present

## 2023-05-09 ENCOUNTER — Other Ambulatory Visit: Payer: Self-pay | Admitting: Nurse Practitioner

## 2023-05-15 DIAGNOSIS — Z79899 Other long term (current) drug therapy: Secondary | ICD-10-CM | POA: Diagnosis not present

## 2023-05-15 DIAGNOSIS — F3340 Major depressive disorder, recurrent, in remission, unspecified: Secondary | ICD-10-CM | POA: Diagnosis not present

## 2023-05-15 DIAGNOSIS — R4183 Borderline intellectual functioning: Secondary | ICD-10-CM | POA: Diagnosis not present

## 2023-05-15 DIAGNOSIS — F410 Panic disorder [episodic paroxysmal anxiety] without agoraphobia: Secondary | ICD-10-CM | POA: Diagnosis not present

## 2023-05-22 DIAGNOSIS — E1159 Type 2 diabetes mellitus with other circulatory complications: Secondary | ICD-10-CM | POA: Diagnosis not present

## 2023-05-22 DIAGNOSIS — E785 Hyperlipidemia, unspecified: Secondary | ICD-10-CM | POA: Diagnosis not present

## 2023-05-22 DIAGNOSIS — E1165 Type 2 diabetes mellitus with hyperglycemia: Secondary | ICD-10-CM | POA: Diagnosis not present

## 2023-05-22 DIAGNOSIS — I152 Hypertension secondary to endocrine disorders: Secondary | ICD-10-CM | POA: Diagnosis not present

## 2023-05-22 DIAGNOSIS — E1169 Type 2 diabetes mellitus with other specified complication: Secondary | ICD-10-CM | POA: Diagnosis not present

## 2023-05-22 DIAGNOSIS — J4540 Moderate persistent asthma, uncomplicated: Secondary | ICD-10-CM | POA: Diagnosis not present

## 2023-05-29 ENCOUNTER — Ambulatory Visit: Payer: Medicare HMO | Admitting: Nurse Practitioner

## 2023-06-05 ENCOUNTER — Ambulatory Visit: Payer: Medicare HMO | Admitting: Nurse Practitioner

## 2023-06-05 ENCOUNTER — Other Ambulatory Visit: Payer: Self-pay | Admitting: Cardiovascular Disease

## 2023-06-08 ENCOUNTER — Ambulatory Visit: Payer: Medicare HMO

## 2023-06-12 DIAGNOSIS — J4540 Moderate persistent asthma, uncomplicated: Secondary | ICD-10-CM | POA: Diagnosis not present

## 2023-06-13 ENCOUNTER — Ambulatory Visit (INDEPENDENT_AMBULATORY_CARE_PROVIDER_SITE_OTHER): Payer: Medicare HMO | Admitting: Surgery

## 2023-06-13 ENCOUNTER — Encounter: Payer: Self-pay | Admitting: Surgery

## 2023-06-13 VITALS — BP 140/77 | HR 105 | Temp 98.5°F | Wt 152.2 lb

## 2023-06-13 DIAGNOSIS — R1310 Dysphagia, unspecified: Secondary | ICD-10-CM

## 2023-06-13 NOTE — Patient Instructions (Addendum)
A referral has been placed to [facility]. They will call you for an appointment.   If you have any concerns or questions, please feel free to call our office.   Hiatal Hernia  A hiatal hernia occurs when part of the stomach slides above the muscle that separates the abdomen from the chest (diaphragm). A person can be born with a hiatal hernia (congenital), or it may develop over time. In almost all cases of hiatal hernia, only the top part of the stomach pushes through the diaphragm. Many people have a hiatal hernia with no symptoms. The larger the hernia, the more likely it is that you will have symptoms. In some cases, a hiatal hernia allows stomach acid to flow back into the tube that carries food from your mouth to your stomach (esophagus). This may cause heartburn symptoms. The development of heartburn symptoms may mean that you have a condition called gastroesophageal reflux disease (GERD). What are the causes? This condition is caused by a weakness in the opening (hiatus) where the esophagus passes through the diaphragm to attach to the upper part of the stomach. A person may be born with a weakness in the hiatus, or a weakness can develop over time. What increases the risk? This condition is more likely to develop in: Older people. Age is a major risk factor for a hiatal hernia, especially if you are over the age of 104. Pregnant women. People who are overweight. People who have frequent constipation. What are the signs or symptoms? Symptoms of this condition usually develop in the form of GERD symptoms. Symptoms include: Heartburn. Upset stomach (indigestion). Trouble swallowing. Coughing or wheezing. Wheezing is making high-pitched whistling sounds when you breathe. Sore throat. Chest pain. Nausea and vomiting. How is this diagnosed? This condition may be diagnosed during testing for GERD. Tests that may be done include: X-rays of your stomach or chest. An upper  gastrointestinal (GI) series. This is an X-ray exam of your GI tract that is taken after you swallow a chalky liquid that shows up clearly on the X-ray. Endoscopy. This is a procedure to look into your stomach using a thin, flexible tube that has a tiny camera and light on the end of it. How is this treated? This condition may be treated by: Dietary and lifestyle changes to help reduce GERD symptoms. Medicines. These may include: Over-the-counter antacids. Medicines that make your stomach empty more quickly. Medicines that block the production of stomach acid (H2 blockers). Stronger medicines to reduce stomach acid (proton pump inhibitors). Surgery to repair the hernia, if other treatments are not helping. If you have no symptoms, you may not need treatment. Follow these instructions at home: Lifestyle and activity Do not use any products that contain nicotine or tobacco. These products include cigarettes, chewing tobacco, and vaping devices, such as e-cigarettes. If you need help quitting, ask your health care provider. Try to achieve and maintain a healthy body weight. Avoid putting pressure on your abdomen. Anything that puts pressure on your abdomen increases the amount of acid that may be pushed up into your esophagus. Avoid bending over, especially after eating. Raise the head of your bed by putting blocks under the legs. This keeps your head and esophagus higher than your stomach. Do not wear tight clothing around your chest or stomach. Try not to strain when having a bowel movement, when urinating, or when lifting heavy objects. Eating and drinking Avoid foods that can worsen GERD symptoms. These may include: Fatty foods, like  fried foods. Citrus fruits, like oranges or lemon. Other foods and drinks that contain acid, like orange juice or tomatoes. Spicy food. Chocolate. Eat frequent small meals instead of three large meals a day. This helps prevent your stomach from getting too  full. Eat slowly. Do not lie down right after eating. Do not eat 1-2 hours before bed. Do not drink beverages with caffeine. These include cola, coffee, cocoa, and tea. Do not drink alcohol. General instructions Take over-the-counter and prescription medicines only as told by your health care provider. Keep all follow-up visits. Your health care provider will want to check that any new prescribed medicines are helping your symptoms. Contact a health care provider if: Your symptoms are not controlled with medicines or lifestyle changes. You are having trouble swallowing. You have coughing or wheezing that will not go away. Your pain is getting worse. Your pain spreads to your arms, neck, jaw, teeth, or back. You feel nauseous or you vomit. Get help right away if: You have shortness of breath. You vomit blood. You have bright red blood in your stools. You have black, tarry stools. These symptoms may be an emergency. Get help right away. Call 911. Do not wait to see if the symptoms will go away. Do not drive yourself to the hospital. Summary A hiatal hernia occurs when part of the stomach slides above the muscle that separates the abdomen from the chest. A person may be born with a weakness in the hiatus, or a weakness can develop over time. Symptoms of a hiatal hernia may include heartburn, trouble swallowing, or sore throat. Management of a hiatal hernia includes eating frequent small meals instead of three large meals a day. Get help right away if you vomit blood, have bright red blood in your stools, or have black, tarry stools. This information is not intended to replace advice given to you by your health care provider. Make sure you discuss any questions you have with your health care provider. Document Revised: 01/17/2022 Document Reviewed: 01/17/2022 Elsevier Patient Education  2024 [employer].

## 2023-06-14 NOTE — Progress Notes (Signed)
Outpatient Surgical Follow Up  06/14/2023  Hannah Clarke is an 57 y.o. female.   Chief Complaint  Patient presents with   Follow-up    Hiatal hernia    HPI: f/u gerd, she has failed medical rx. Persist GERD w regurgitation. She does have chronic asthma and uses inhalors.  She did have repeat CT A/P  that I have personally reviewed showing small hiatal hernia sliding.  No evidence for strictures or any other esophageal issues , swallow also shows small HH  Past Medical History:  Diagnosis Date   Anxiety    Chronic headaches    Depression    Dermatophytosis of nail    Diabetes mellitus without complication (HCC)    GERD (gastroesophageal reflux disease)    High cholesterol    History of kidney stones    Hypertension    Mental developmental delay    Onychia of toe    Panic attacks     Past Surgical History:  Procedure Laterality Date   COLONOSCOPY WITH PROPOFOL N/A 10/09/2017   Procedure: COLONOSCOPY WITH PROPOFOL;  Surgeon: Toledo, Boykin Nearing, MD;  Location: [facility] END[facility]COPY;  Service: Gastroenterology;  Laterality: N/A;   COLONOSCOPY WITH PROPOFOL N/A 02/06/2018   Procedure: COLONOSCOPY WITH PROPOFOL;  Surgeon: Toledo, Boykin Nearing, MD;  Location: [facility];  Service: Gastroenterology;  Laterality: N/A;   ESOPHAGOGASTRODUODENOSCOPY (EGD) WITH PROPOFOL N/A 10/09/2017   Procedure: ESOPHAGOGASTRODUODENOSCOPY (EGD) WITH PROPOFOL;  Surgeon: Toledo, Boykin Nearing, MD;  Location: [facility];  Service: Gastroenterology;  Laterality: N/A;   ESOPHAGOGASTRODUODENOSCOPY (EGD) WITH PROPOFOL N/A 07/31/2018   Procedure: ESOPHAGOGASTRODUODENOSCOPY (EGD) WITH PROPOFOL;  Surgeon: Toledo, Boykin Nearing, MD;  Location: [facility];  Service: Gastroenterology;  Laterality: N/A;   ESOPHAGOGASTRODUODENOSCOPY (EGD) WITH PROPOFOL N/A 11/29/2022   Procedure: ESOPHAGOGASTRODUODENOSCOPY (EGD) WITH PROPOFOL;  Surgeon: Toledo, Boykin Nearing, MD;  Location: [facility];  Service: Gastroenterology;  Laterality:  N/A;   HAND SURGERY     KIDNEY STONE SURGERY      Family History  Problem Relation Age of Onset   Breast cancer Mother 78       pre pt.    Diabetes Father    Diabetes Paternal Aunt    Diabetes Paternal Uncle    Diabetes Paternal Grandmother    Diabetes Paternal Grandfather     Social History:  reports that she has never smoked. She has never used smokeless tobacco. She reports that she does not currently use alcohol. She reports that she does not use drugs.  Allergies: No Known Allergies  Medications reviewed.    ROS Full ROS performed and is otherwise negative other than what is stated in HPI   BP (!) 140/77   Pulse (!) 105   Temp 98.5 F (36.9 C) (Oral)   Wt 152 lb 3.2 oz (69 kg)   SpO2 96%   BMI 26.96 kg/m    Physical Exam    CONSTITUTIONAL: NAD. EYES: Pupils are equal, round,  Sclera are non-icteric. EARS, NOSE, MOUTH AND THROAT: The oral mucosa is pink and moist. Hearing is intact to voice. LYMPH NODES:  Lymph nodes in the neck are normal. RESPIRATORY:  Lungs are clear. There is normal respiratory effort, with equal breath sounds bilaterally, and without pathologic use of accessory muscles. CARDIOVASCULAR: Heart is regular without murmurs, gallops, or rubs. GI: The abdomen is  soft, nontender, and nondistended. There are no palpable masses. There is no hepatosplenomegaly. There are normal bowel sounds in all quadrants. GU: Rectal deferred.   MUSCULOSKELETAL: Normal muscle  strength and tone. No cyanosis or edema.   SKIN: Turgor is good and there are no pathologic skin lesions or ulcers. NEUROLOGIC: Motor and sensation is grossly normal. Cranial nerves are grossly intact. PSYCH:  Oriented to person, place and time. Affect is normal. She does have some intellectual diabilities   Assessment/Plan: Recalcitrant reflux w hiatal hernia. She does have some disability she is adamant that she continues to have reflux symptoms as well as regurgitation that is not  responsive to medical therapy.  SHe is begging me to do an antireflux procedure.  Seems miserable with significant reflux issues.  We will ask GI again and see if we can objectively quantify the degree of reflux with a Bravo probe.  Discussed with Mr Margarita Mail and Dr Norma Fredrickson who will re-evaluate. If GI has determined that she is fact has reflux that may respond to surgical intervention I will consider this.  It is a difficult situation and definetely not black or white. I spent > 40 min  in this encounter including personally reviewing imaging studies, coordinating her care and counseling the patient   Sterling Big, MD [facility] General Surgeon

## 2023-06-15 ENCOUNTER — Ambulatory Visit: Payer: Medicare HMO

## 2023-06-15 ENCOUNTER — Telehealth: Payer: Self-pay

## 2023-06-15 NOTE — Telephone Encounter (Signed)
Faxed referral to Wylie Hail, [place] at [phone].

## 2023-06-19 ENCOUNTER — Ambulatory Visit: Payer: Medicare HMO

## 2023-06-26 ENCOUNTER — Ambulatory Visit (INDEPENDENT_AMBULATORY_CARE_PROVIDER_SITE_OTHER): Payer: Medicare HMO | Admitting: Family

## 2023-06-26 DIAGNOSIS — J4540 Moderate persistent asthma, uncomplicated: Secondary | ICD-10-CM | POA: Diagnosis not present

## 2023-06-26 DIAGNOSIS — Z3042 Encounter for surveillance of injectable contraceptive: Secondary | ICD-10-CM

## 2023-06-26 MED ORDER — MEDROXYPROGESTERONE ACETATE 150 MG/ML IM SUSY
150.0000 mg | PREFILLED_SYRINGE | Freq: Once | INTRAMUSCULAR | Status: AC
Start: 2023-06-26 — End: 2023-06-26
  Administered 2023-06-26: 150 mg via INTRAMUSCULAR

## 2023-06-27 ENCOUNTER — Encounter: Payer: Self-pay | Admitting: Family

## 2023-06-27 NOTE — Progress Notes (Signed)
   Subjective   CHIEF COMPLAINT  Depo Provera Shot   REASON FOR VISIT  Depo Shot Only      Objective   No results found for any visits on 06/26/23.  Assessment & Plan  1. Encounter for surveillance of injectable contraceptive Depo shot given in office today.  Repeat in 3 months.   - medroxyPROGESTERone Acetate SUSY 150 mg   Total time spent: 5 minutes  Miki Kins, FNP 06/26/2023

## 2023-07-02 ENCOUNTER — Other Ambulatory Visit: Payer: Self-pay | Admitting: *Deleted

## 2023-07-02 DIAGNOSIS — N200 Calculus of kidney: Secondary | ICD-10-CM

## 2023-07-06 ENCOUNTER — Other Ambulatory Visit: Payer: Self-pay | Admitting: Family

## 2023-07-09 ENCOUNTER — Encounter: Payer: Self-pay | Admitting: Urology

## 2023-07-09 ENCOUNTER — Ambulatory Visit: Admission: RE | Admit: 2023-07-09 | Payer: Medicare HMO | Source: Ambulatory Visit

## 2023-07-09 ENCOUNTER — Ambulatory Visit (INDEPENDENT_AMBULATORY_CARE_PROVIDER_SITE_OTHER): Payer: Medicare HMO | Admitting: Urology

## 2023-07-09 ENCOUNTER — Ambulatory Visit
Admission: RE | Admit: 2023-07-09 | Discharge: 2023-07-09 | Disposition: A | Discharge status: Home / Self Care | Payer: Medicare HMO | Attending: Urology | Admitting: Urology

## 2023-07-09 VITALS — BP 140/80 | HR 74 | Ht 63.0 in | Wt 153.0 lb

## 2023-07-09 DIAGNOSIS — N200 Calculus of kidney: Secondary | ICD-10-CM | POA: Insufficient documentation

## 2023-07-09 NOTE — Progress Notes (Signed)
I,Hannah Clarke,acting as a scribe for Hannah Altes, MD.,have documented all relevant documentation on the behalf of Hannah Altes, MD,as directed by  Hannah Altes, MD while in the presence of Hannah Altes, MD.  07/09/2023 12:31 PM   Hannah Clarke Hannah Clarke 1966-07-01 [id number]  Referring provider: Orson Eva, NP [address] [place],  [place] [address]  Chief Complaint  Patient presents with   Nephrolithiasis    Urologic history: 1.  Left nephrolithiasis Nonobstructing left renal calculus   HPI: 57 y.o. female presents for annual follow-up.  Doing well since last visit No bothersome LUTS Denies dysuria, gross hematuria No flank pain She had a CT of the adomen/pelvis October 03, 2022, which showed a single non-obstructing left floor pole calculus, measuring 7 mm   PMH: Past Medical History:  Diagnosis Date   Anxiety    Chronic headaches    Depression    Dermatophytosis of nail    Diabetes mellitus without complication (HCC)    GERD (gastroesophageal reflux disease)    High cholesterol    History of kidney stones    Hypertension    Mental developmental delay    Onychia of toe    Panic attacks     Surgical History: Past Surgical History:  Procedure Laterality Date   COLONOSCOPY WITH PROPOFOL N/A 10/09/2017   Procedure: COLONOSCOPY WITH PROPOFOL;  Surgeon: Toledo, Boykin Nearing, MD;  Location: [facility];  Service: Gastroenterology;  Laterality: N/A;   COLONOSCOPY WITH PROPOFOL N/A 02/06/2018   Procedure: COLONOSCOPY WITH PROPOFOL;  Surgeon: Toledo, Boykin Nearing, MD;  Location: [facility];  Service: Gastroenterology;  Laterality: N/A;   ESOPHAGOGASTRODUODENOSCOPY (EGD) WITH PROPOFOL N/A 10/09/2017   Procedure: ESOPHAGOGASTRODUODENOSCOPY (EGD) WITH PROPOFOL;  Surgeon: Toledo, Boykin Nearing, MD;  Location: [facility];  Service: Gastroenterology;  Laterality: N/A;   ESOPHAGOGASTRODUODENOSCOPY (EGD) WITH PROPOFOL N/A 07/31/2018   Procedure:  ESOPHAGOGASTRODUODENOSCOPY (EGD) WITH PROPOFOL;  Surgeon: Toledo, Boykin Nearing, MD;  Location: [facility];  Service: Gastroenterology;  Laterality: N/A;   ESOPHAGOGASTRODUODENOSCOPY (EGD) WITH PROPOFOL N/A 11/29/2022   Procedure: ESOPHAGOGASTRODUODENOSCOPY (EGD) WITH PROPOFOL;  Surgeon: Toledo, Boykin Nearing, MD;  Location: [facility];  Service: Gastroenterology;  Laterality: N/A;   HAND SURGERY     KIDNEY STONE SURGERY      Home Medications:  Allergies as of 07/09/2023   No Known Allergies      Medication List        Accurate as of July 09, 2023 12:31 PM. If you have any questions, ask your nurse or doctor.          acetaminophen 325 MG tablet Commonly known as: TYLENOL Take 325 mg by mouth every 6 (six) hours as needed.   albuterol 108 (90 Base) MCG/ACT inhaler Commonly known as: VENTOLIN HFA Inhale 2 puffs into the lungs every 6 (six) hours as needed for wheezing or shortness of breath.   azelastine 0.1 % nasal spray Commonly known as: ASTELIN   benzonatate 100 MG capsule Commonly known as: TESSALON   budesonide 0.5 MG/2ML nebulizer solution Commonly known as: PULMICORT Inhale into the lungs.   buPROPion 300 MG 24 hr tablet Commonly known as: WELLBUTRIN XL   busPIRone 30 MG tablet Commonly known as: BUSPAR   cetirizine 10 MG tablet Commonly known as: ZYRTEC TAKE 1 TABLET BY MOUTH EACH NIGHT AT BEDTIME FOR ALLERGY SYMPTOMS   cholecalciferol 1000 units tablet Commonly known as: VITAMIN D Take 1,000 Units by mouth daily.   citalopram 10 MG tablet Commonly known  as: CELEXA Take 10 mg by mouth daily.   dexlansoprazole 60 MG capsule Commonly known as: DEXILANT Take 60 mg by mouth daily.   Dulaglutide 0.75 MG/0.5ML Sopn Inject into the skin.   Dupilumab 300 MG/2ML Sopn Inject into the skin.   EPINEPHrine 0.3 mg/0.3 mL Soaj injection Commonly known as: EPI-PEN   famotidine 40 MG tablet Commonly known as: PEPCID   fluticasone 50 MCG/ACT nasal  spray Commonly known as: FLONASE   gabapentin 400 MG capsule Commonly known as: NEURONTIN   IBgard 90 MG Cpcr Generic drug: Peppermint Oil TAKE 2 CAPSULES BY MOUTH 2 TIMES A DAY BEFORE MEALS   ipratropium 0.06 % nasal spray Commonly known as: ATROVENT   lactulose 10 GM/15ML solution Commonly known as: CHRONULAC Take 30 mLs (20 g total) by mouth daily as needed for mild constipation.   Linzess 290 MCG Caps capsule Generic drug: linaclotide   LORazepam 1 MG tablet Commonly known as: ATIVAN Take 1 tablet (1 mg total) by mouth every 8 (eight) hours as needed for anxiety.   losartan-hydrochlorothiazide 100-12.5 MG tablet Commonly known as: HYZAAR TAKE 1 TABLET BY MOUTH DAILY   lubiprostone 24 MCG capsule Commonly known as: AMITIZA Take by mouth.   medroxyPROGESTERone 150 MG/ML injection Commonly known as: DEPO-PROVERA Inject 150 mg into the muscle every 3 (three) months.   metFORMIN 500 MG tablet Commonly known as: GLUCOPHAGE Take 2 tablets by mouth 2 (two) times daily.   Moderna COVID-19 Vaccine 100 MCG/0.5ML injection Generic drug: COVID-19 mRNA vaccine (Moderna)   montelukast 10 MG tablet Commonly known as: SINGULAIR Take 10 mg by mouth daily.   nortriptyline 50 MG capsule Commonly known as: PAMELOR   ondansetron 4 MG tablet Commonly known as: ZOFRAN Take 1 tablet by mouth every 8 (eight) hours as needed.   pantoprazole 40 MG tablet Commonly known as: PROTONIX TAKE 1 TABLET BY MOUTH EVERY DAY   polyethylene glycol powder 17 GM/SCOOP powder Commonly known as: GLYCOLAX/MIRALAX   polyethylene glycol 17 g packet Commonly known as: MiraLax Mix-In Pax Take 17 g by mouth daily. Take 3 times a day for 2 days, then 2 times a day for one week. Then may reduce to 1-2 times a day as needed.   potassium chloride SA 20 MEQ tablet Commonly known as: KLOR-CON M Take 20 mEq by mouth daily.   rosuvastatin 10 MG tablet Commonly known as: CRESTOR TAKE 1 TABLET BY  MOUTH ONCE EVERY DAY   sennosides-docusate sodium 8.6-50 MG tablet Commonly known as: SENOKOT-S Take 1 tablet by mouth daily.   simethicone 125 MG chewable tablet Commonly known as: MYLICON   sucralfate 1 g tablet Commonly known as: CARAFATE Take 1 g by mouth 4 (four) times daily -  with meals and at bedtime.   Trelegy Ellipta 200-62.5-25 MCG/ACT Aepb Generic drug: Fluticasone-Umeclidin-Vilant Inhale into the lungs.   True Metrix Blood Glucose Test test strip Generic drug: glucose blood 1 each 2 (two) times daily.        Family History: Family History  Problem Relation Age of Onset   Breast cancer Mother 24       pre pt.    Diabetes Father    Diabetes Paternal Aunt    Diabetes Paternal Uncle    Diabetes Paternal Grandmother    Diabetes Paternal Grandfather     Social History:  reports that she has never smoked. She has never used smokeless tobacco. She reports that she does not currently use alcohol. She reports that she  does not use drugs.   Physical Exam: BP (!) 140/80   Pulse 74   Ht 5\' 3"  (1.6 m)   Wt 153 lb (69.4 kg)   BMI 27.10 kg/m   Constitutional:  Alert, no acute distress. HEENT: [place] AT, moist mucus membranes.  Trachea midline, no masses. Cardiovascular: No clubbing, cyanosis, or edema. Respiratory: Normal respiratory effort, no increased work of breathing.   Pertinent Imaging:  CT scan 10/03/22 was personally reviewed and interpreted. The radiology report was reviewed.  KUB performed today was personally reviewed and interpreted. There was a large amount of stool and bowel gas obscuring the renal outlines and it is difficult to visualize the calculus, though there is a faint calcification line between the L2 and L3 transverse processes. The KUB has not been read by radiology.    Assessment & Plan:    1. Nephrolithiasis Non-obstructing left renal calculus, asymptomatic. Continue annual follow up with KUB and call as needed for left  flank/abdominal pain.  I have reviewed the above documentation for accuracy and completeness, and I agree with the above.   Hannah Altes, MD  [facility] [address], [address] [place], [place] [address] [phone]

## 2023-07-10 DIAGNOSIS — J4540 Moderate persistent asthma, uncomplicated: Secondary | ICD-10-CM | POA: Diagnosis not present

## 2023-07-18 NOTE — Progress Notes (Signed)
   Subjective   CHIEF COMPLAINT  Depo-Provera    REASON FOR VISIT  Depo-Provera Injection       Objective   No results found for any visits on 03/09/23.  Assessment & Plan  1. Encounter for contraceptive management, unspecified type Depo-Provera injection given in office today.  Repeat in 3 months.  - medroxyPROGESTERone Acetate SUSY   Total time spent: 5 minutes  Miki Kins, FNP 03/09/2023

## 2023-07-24 ENCOUNTER — Ambulatory Visit: Payer: Medicare HMO | Admitting: Cardiology

## 2023-07-24 ENCOUNTER — Ambulatory Visit
Admission: RE | Admit: 2023-07-24 | Discharge: 2023-07-24 | Disposition: A | Discharge status: Home / Self Care | Payer: Medicare HMO | Source: Ambulatory Visit | Attending: Urology | Admitting: Urology

## 2023-07-24 ENCOUNTER — Ambulatory Visit
Admission: RE | Admit: 2023-07-24 | Discharge: 2023-07-24 | Disposition: A | Discharge status: Home / Self Care | Payer: Medicare HMO | Source: Ambulatory Visit | Attending: Cardiology | Admitting: Cardiology

## 2023-07-24 ENCOUNTER — Ambulatory Visit
Admission: RE | Admit: 2023-07-24 | Discharge: 2023-07-24 | Disposition: A | Discharge status: Home / Self Care | Payer: Medicare HMO | Attending: Cardiology | Admitting: Cardiology

## 2023-07-24 ENCOUNTER — Encounter: Payer: Self-pay | Admitting: Cardiology

## 2023-07-24 VITALS — BP 112/84 | Ht 63.0 in | Wt 152.0 lb

## 2023-07-24 DIAGNOSIS — M2041 Other hammer toe(s) (acquired), right foot: Secondary | ICD-10-CM | POA: Diagnosis not present

## 2023-07-24 DIAGNOSIS — M7731 Calcaneal spur, right foot: Secondary | ICD-10-CM | POA: Diagnosis not present

## 2023-07-24 DIAGNOSIS — M79671 Pain in right foot: Secondary | ICD-10-CM | POA: Diagnosis not present

## 2023-07-24 DIAGNOSIS — R519 Headache, unspecified: Secondary | ICD-10-CM | POA: Diagnosis not present

## 2023-07-24 DIAGNOSIS — M25561 Pain in right knee: Secondary | ICD-10-CM | POA: Insufficient documentation

## 2023-07-24 DIAGNOSIS — S0083XA Contusion of other part of head, initial encounter: Secondary | ICD-10-CM | POA: Diagnosis not present

## 2023-07-24 DIAGNOSIS — N200 Calculus of kidney: Secondary | ICD-10-CM | POA: Diagnosis not present

## 2023-07-24 DIAGNOSIS — M25571 Pain in right ankle and joints of right foot: Secondary | ICD-10-CM | POA: Diagnosis not present

## 2023-07-24 DIAGNOSIS — J4540 Moderate persistent asthma, uncomplicated: Secondary | ICD-10-CM | POA: Diagnosis not present

## 2023-07-24 MED ORDER — MELOXICAM 15 MG PO TABS: 15.0000 mg | ORAL_TABLET | Freq: Every day | ORAL | 1 refills | Status: DC

## 2023-07-24 NOTE — Progress Notes (Signed)
Established Patient Office Visit  Subjective:  Patient ID: Hannah Clarke, female    DOB: [birth year]  Age: 57 y.o. MRN: [id number]  Chief Complaint  Patient presents with   Fall    Patient has been having falls    Patient in office for acute visit, states she has fallen twice. Patient unsure why she has fallen, tripped over object on floor. Complaining of right ankle, right knee and left facial pain. Will order xrays to check for fractures. Patient taking tylenol and ibuprofen for pain, mild relief. Will send in Mobic.   Fall The accident occurred 5 to 7 days ago. The fall occurred in unknown circumstances. There was no blood loss. The point of impact was the head and right knee. The pain is present in the face, right knee and right foot. The pain is mild. Pertinent negatives include no abdominal pain or headaches. She has tried acetaminophen and NSAID for the symptoms.    No other concerns at this time.   Past Medical History:  Diagnosis Date   Anxiety    Chronic headaches    Depression    Dermatophytosis of nail    Diabetes mellitus without complication (HCC)    GERD (gastroesophageal reflux disease)    High cholesterol    History of kidney stones    Hypertension    Mental developmental delay    Onychia of toe    Panic attacks     Past Surgical History:  Procedure Laterality Date   COLONOSCOPY WITH PROPOFOL N/A 10/09/2017   Procedure: COLONOSCOPY WITH PROPOFOL;  Surgeon: Toledo, Boykin Nearing, MD;  Location: [facility];  Service: Gastroenterology;  Laterality: N/A;   COLONOSCOPY WITH PROPOFOL N/A 02/06/2018   Procedure: COLONOSCOPY WITH PROPOFOL;  Surgeon: Toledo, Boykin Nearing, MD;  Location: [facility];  Service: Gastroenterology;  Laterality: N/A;   ESOPHAGOGASTRODUODENOSCOPY (EGD) WITH PROPOFOL N/A 10/09/2017   Procedure: ESOPHAGOGASTRODUODENOSCOPY (EGD) WITH PROPOFOL;  Surgeon: Toledo, Boykin Nearing, MD;  Location: [facility];  Service: Gastroenterology;  Laterality:  N/A;   ESOPHAGOGASTRODUODENOSCOPY (EGD) WITH PROPOFOL N/A 07/31/2018   Procedure: ESOPHAGOGASTRODUODENOSCOPY (EGD) WITH PROPOFOL;  Surgeon: Toledo, Boykin Nearing, MD;  Location: [facility];  Service: Gastroenterology;  Laterality: N/A;   ESOPHAGOGASTRODUODENOSCOPY (EGD) WITH PROPOFOL N/A 11/29/2022   Procedure: ESOPHAGOGASTRODUODENOSCOPY (EGD) WITH PROPOFOL;  Surgeon: Toledo, Boykin Nearing, MD;  Location: [facility];  Service: Gastroenterology;  Laterality: N/A;   HAND SURGERY     KIDNEY STONE SURGERY      Social History   Socioeconomic History   Marital status: Single    Spouse name: Not on file   Number of children: Not on file   Years of education: Not on file   Highest education level: Not on file  Occupational History   Not on file  Tobacco Use   Smoking status: Never   Smokeless tobacco: Never  Vaping Use   Vaping status: Never Used  Substance and Sexual Activity   Alcohol use: Not Currently    Comment: OCCASSIONALLY   Drug use: No   Sexual activity: Not on file  Other Topics Concern   Not on file  Social History Narrative   Not on file   Social Determinants of Health   Financial Resource Strain: Not on file  Food Insecurity: No Food Insecurity (07/18/2022)   Hunger Vital Sign    Worried About Running Out of Food in the Last Year: Never true    Ran Out of Food in the Last Year: Never true  Transportation  Needs: No Transportation Needs (07/18/2022)   PRAPARE - Administrator, Civil Service (Medical): No    Lack of Transportation (Non-Medical): No  Physical Activity: Not on file  Stress: Not on file  Social Connections: Not on file  Intimate Partner Violence: Not on file    Family History  Problem Relation Age of Onset   Breast cancer Mother 65       pre pt.    Diabetes Father    Diabetes Paternal Aunt    Diabetes Paternal Uncle    Diabetes Paternal Grandmother    Diabetes Paternal Grandfather     No Known Allergies  Review of Systems   Constitutional: Negative.   HENT: Negative.    Eyes: Negative.   Respiratory: Negative.  Negative for shortness of breath.   Cardiovascular: Negative.  Negative for chest pain.  Gastrointestinal: Negative.  Negative for abdominal pain, constipation and diarrhea.  Genitourinary: Negative.   Musculoskeletal:  Positive for falls and joint pain. Negative for myalgias.  Skin: Negative.   Neurological: Negative.  Negative for dizziness and headaches.  Endo/Heme/Allergies: Negative.   All other systems reviewed and are negative.      Objective:   Ht 5\' 3"  (1.6 m)   Wt 152 lb (68.9 kg)   SpO2 99%   BMI 26.93 kg/m   Vitals:   07/24/23 1116  Height: 5\' 3"  (1.6 m)  Weight: 152 lb (68.9 kg)  SpO2: 99%  BMI (Calculated): 26.93    Physical Exam Vitals and nursing note reviewed.  Constitutional:      Appearance: Normal appearance. She is normal weight.  HENT:     Head: Normocephalic and atraumatic.      Nose: Nose normal.     Mouth/Throat:     Mouth: Mucous membranes are moist.  Eyes:     Extraocular Movements: Extraocular movements intact.     Conjunctiva/sclera: Conjunctivae normal.     Pupils: Pupils are equal, round, and reactive to light.  Cardiovascular:     Rate and Rhythm: Normal rate and regular rhythm.     Pulses: Normal pulses.     Heart sounds: Normal heart sounds.  Pulmonary:     Effort: Pulmonary effort is normal.     Breath sounds: Normal breath sounds.  Abdominal:     General: Abdomen is flat. Bowel sounds are normal.     Palpations: Abdomen is soft.  Musculoskeletal:        General: Normal range of motion.     Cervical back: Normal range of motion.     Right knee: Swelling present. Tenderness present.     Right ankle: Swelling present. Tenderness present.  Skin:    General: Skin is warm and dry.  Neurological:     General: No focal deficit present.     Mental Status: She is alert and oriented to person, place, and time.  Psychiatric:        Mood  and Affect: Mood normal.        Behavior: Behavior normal.        Thought Content: Thought content normal.        Judgment: Judgment normal.      No results found for any visits on 07/24/23.  No results found for this or any previous visit (from the past 2160 hour(s)).    Assessment & Plan:  Xray of ankle, knee and face. Mobic for pain.  Problem List Items Addressed This Visit   None Visit Diagnoses  Acute right ankle pain    -  Primary   Relevant Orders   DG Foot Complete Right   Acute pain of right knee       Relevant Orders   DG Knee Complete 4 Views Right   Facial pain       Relevant Orders   DG Facial Bones Complete       No follow-ups on file.   Total time spent: 25 minutes  Google, NP  07/24/2023   This document may have been prepared by Dragon Voice Recognition software and as such may include unintentional dictation errors.

## 2023-07-27 ENCOUNTER — Telehealth: Payer: Self-pay | Admitting: Cardiology

## 2023-07-27 NOTE — Telephone Encounter (Signed)
Patient left another VM requesting her MRI results.

## 2023-07-27 NOTE — Telephone Encounter (Signed)
Patient left VM wanting her x-ray results from 8/20. Radiologist has not read them yet so we do not have them yet to give them to her.

## 2023-07-30 NOTE — Progress Notes (Signed)
Patient called back and verbalized understanding.

## 2023-07-31 ENCOUNTER — Telehealth: Payer: Self-pay | Admitting: Cardiology

## 2023-07-31 NOTE — Telephone Encounter (Signed)
Patient called in complaining that her ankle still has a knot and is bothering her. I reassured her that her x-rays were normal and showed no fractures. Patient verbalized understanding and stated she has an appt with Hospital doctor tomorrow. She will discuss further concerns at tomorrow's appt. Just FYI.

## 2023-08-01 ENCOUNTER — Encounter: Payer: Self-pay | Admitting: Cardiology

## 2023-08-01 ENCOUNTER — Ambulatory Visit (INDEPENDENT_AMBULATORY_CARE_PROVIDER_SITE_OTHER): Payer: Medicare HMO | Admitting: Cardiology

## 2023-08-01 VITALS — BP 139/81 | HR 97 | Ht 62.0 in | Wt 156.6 lb

## 2023-08-01 DIAGNOSIS — M25571 Pain in right ankle and joints of right foot: Secondary | ICD-10-CM | POA: Diagnosis not present

## 2023-08-01 DIAGNOSIS — M25561 Pain in right knee: Secondary | ICD-10-CM

## 2023-08-01 NOTE — Progress Notes (Signed)
Established Patient Office Visit  Subjective:  Patient ID: Hannah Clarke, female    DOB: [birth year]  Age: 57 y.o. MRN: [id number]  Chief Complaint  Patient presents with   Knee Pain    Patient in office complaining of right knee pain and right ankle. Patient seen 07/24/23 for same complaints. Xrays ordered of both knee and ankle, both showed no fracture. Patient continues to experience pain despite wearing an ankle brace. Patient taking meloxicam with some relief. Will refer to orthopaedics for further treatment.   Knee Pain  The incident occurred more than 1 week ago. The incident occurred at home. The injury mechanism was a fall. The pain is mild. The pain has been Constant since onset. She reports no foreign bodies present. The symptoms are aggravated by movement. She has tried NSAIDs, acetaminophen and immobilization for the symptoms.    No other concerns at this time.   Past Medical History:  Diagnosis Date   Anxiety    Chronic headaches    Depression    Dermatophytosis of nail    Diabetes mellitus without complication (HCC)    GERD (gastroesophageal reflux disease)    High cholesterol    History of kidney stones    Hypertension    Mental developmental delay    Onychia of toe    Panic attacks     Past Surgical History:  Procedure Laterality Date   COLONOSCOPY WITH PROPOFOL N/A 10/09/2017   Procedure: COLONOSCOPY WITH PROPOFOL;  Surgeon: Toledo, Boykin Nearing, MD;  Location: [facility];  Service: Gastroenterology;  Laterality: N/A;   COLONOSCOPY WITH PROPOFOL N/A 02/06/2018   Procedure: COLONOSCOPY WITH PROPOFOL;  Surgeon: Toledo, Boykin Nearing, MD;  Location: [facility];  Service: Gastroenterology;  Laterality: N/A;   ESOPHAGOGASTRODUODENOSCOPY (EGD) WITH PROPOFOL N/A 10/09/2017   Procedure: ESOPHAGOGASTRODUODENOSCOPY (EGD) WITH PROPOFOL;  Surgeon: Toledo, Boykin Nearing, MD;  Location: [facility];  Service: Gastroenterology;  Laterality: N/A;   ESOPHAGOGASTRODUODENOSCOPY  (EGD) WITH PROPOFOL N/A 07/31/2018   Procedure: ESOPHAGOGASTRODUODENOSCOPY (EGD) WITH PROPOFOL;  Surgeon: Toledo, Boykin Nearing, MD;  Location: [facility];  Service: Gastroenterology;  Laterality: N/A;   ESOPHAGOGASTRODUODENOSCOPY (EGD) WITH PROPOFOL N/A 11/29/2022   Procedure: ESOPHAGOGASTRODUODENOSCOPY (EGD) WITH PROPOFOL;  Surgeon: Toledo, Boykin Nearing, MD;  Location: [facility];  Service: Gastroenterology;  Laterality: N/A;   HAND SURGERY     KIDNEY STONE SURGERY      Social History   Socioeconomic History   Marital status: Single    Spouse name: Not on file   Number of children: Not on file   Years of education: Not on file   Highest education level: Not on file  Occupational History   Not on file  Tobacco Use   Smoking status: Never   Smokeless tobacco: Never  Vaping Use   Vaping status: Never Used  Substance and Sexual Activity   Alcohol use: Not Currently    Comment: OCCASSIONALLY   Drug use: No   Sexual activity: Not on file  Other Topics Concern   Not on file  Social History Narrative   Not on file   Social Determinants of Health   Financial Resource Strain: Not on file  Food Insecurity: No Food Insecurity (07/18/2022)   Hunger Vital Sign    Worried About Running Out of Food in the Last Year: Never true    Ran Out of Food in the Last Year: Never true  Transportation Needs: No Transportation Needs (07/18/2022)   PRAPARE - Transportation    Lack of Transportation (  Medical): No    Lack of Transportation (Non-Medical): No  Physical Activity: Not on file  Stress: Not on file  Social Connections: Not on file  Intimate Partner Violence: Not on file    Family History  Problem Relation Age of Onset   Breast cancer Mother 37       pre pt.    Diabetes Father    Diabetes Paternal Aunt    Diabetes Paternal Uncle    Diabetes Paternal Grandmother    Diabetes Paternal Grandfather     No Known Allergies  Review of Systems  Constitutional: Negative.   HENT:  Negative.    Eyes: Negative.   Respiratory: Negative.  Negative for shortness of breath.   Cardiovascular: Negative.  Negative for chest pain.  Gastrointestinal: Negative.  Negative for abdominal pain, constipation and diarrhea.  Genitourinary: Negative.   Musculoskeletal:  Positive for joint pain. Negative for myalgias.  Skin: Negative.   Neurological: Negative.  Negative for dizziness and headaches.  Endo/Heme/Allergies: Negative.   All other systems reviewed and are negative.      Objective:   BP 139/81   Pulse 97   Ht 5\' 2"  (1.575 m)   Wt 156 lb 9.6 oz (71 kg)   SpO2 98%   BMI 28.64 kg/m   Vitals:   08/01/23 1321  BP: 139/81  Pulse: 97  Height: 5\' 2"  (1.575 m)  Weight: 156 lb 9.6 oz (71 kg)  SpO2: 98%  BMI (Calculated): 28.64    Physical Exam Vitals and nursing note reviewed.  Constitutional:      Appearance: Normal appearance. She is normal weight.  HENT:     Head: Normocephalic and atraumatic.     Nose: Nose normal.     Mouth/Throat:     Mouth: Mucous membranes are moist.  Eyes:     Extraocular Movements: Extraocular movements intact.     Conjunctiva/sclera: Conjunctivae normal.     Pupils: Pupils are equal, round, and reactive to light.  Cardiovascular:     Rate and Rhythm: Normal rate and regular rhythm.     Pulses: Normal pulses.     Heart sounds: Normal heart sounds.  Pulmonary:     Effort: Pulmonary effort is normal.     Breath sounds: Normal breath sounds.  Abdominal:     General: Abdomen is flat. Bowel sounds are normal.     Palpations: Abdomen is soft.  Musculoskeletal:        General: Normal range of motion.     Cervical back: Normal range of motion.  Skin:    General: Skin is warm and dry.  Neurological:     General: No focal deficit present.     Mental Status: She is alert and oriented to person, place, and time.  Psychiatric:        Mood and Affect: Mood normal.        Behavior: Behavior normal.        Thought Content: Thought  content normal.        Judgment: Judgment normal.      No results found for any visits on 08/01/23.  No results found for this or any previous visit (from the past 2160 hour(s)).    Assessment & Plan:  Follow up with orthopaedics for further management of ankle and knee pain.   Problem List Items Addressed This Visit       Other   Acute right ankle pain - Primary   Relevant Orders   Ambulatory referral to  Orthopedic Surgery   Acute pain of right knee   Relevant Orders   Ambulatory referral to Orthopedic Surgery    Return if symptoms worsen or fail to improve.   Total time spent: 25 minutes  Google, NP  08/01/2023   This document may have been prepared by Dragon Voice Recognition software and as such may include unintentional dictation errors.

## 2023-08-07 DIAGNOSIS — J4540 Moderate persistent asthma, uncomplicated: Secondary | ICD-10-CM | POA: Diagnosis not present

## 2023-08-14 ENCOUNTER — Ambulatory Visit (INDEPENDENT_AMBULATORY_CARE_PROVIDER_SITE_OTHER): Payer: Medicare HMO | Admitting: Cardiovascular Disease

## 2023-08-14 ENCOUNTER — Encounter: Payer: Self-pay | Admitting: Cardiovascular Disease

## 2023-08-14 VITALS — BP 137/62 | HR 100 | Ht 62.0 in | Wt 149.2 lb

## 2023-08-14 DIAGNOSIS — M25562 Pain in left knee: Secondary | ICD-10-CM | POA: Diagnosis not present

## 2023-08-14 DIAGNOSIS — R0789 Other chest pain: Secondary | ICD-10-CM | POA: Diagnosis not present

## 2023-08-14 DIAGNOSIS — G4733 Obstructive sleep apnea (adult) (pediatric): Secondary | ICD-10-CM | POA: Diagnosis not present

## 2023-08-14 DIAGNOSIS — K219 Gastro-esophageal reflux disease without esophagitis: Secondary | ICD-10-CM

## 2023-08-14 DIAGNOSIS — E782 Mixed hyperlipidemia: Secondary | ICD-10-CM

## 2023-08-14 DIAGNOSIS — I10 Essential (primary) hypertension: Secondary | ICD-10-CM | POA: Diagnosis not present

## 2023-08-14 DIAGNOSIS — M25561 Pain in right knee: Secondary | ICD-10-CM | POA: Diagnosis not present

## 2023-08-14 DIAGNOSIS — R110 Nausea: Secondary | ICD-10-CM

## 2023-08-14 NOTE — Progress Notes (Signed)
Cardiology Office Note   Date:  08/14/2023   ID:  Hannah, Clarke 1966/09/16, MRN [id number]  PCP:  Marisue Ivan, NP  Cardiologist:  Adrian Blackwater, MD      History of Present Illness: Hannah Clarke is a 57 y.o. female who presents for No chief complaint on file.   vomiting      Past Medical History:  Diagnosis Date   Anxiety    Chronic headaches    Depression    Dermatophytosis of nail    Diabetes mellitus without complication (HCC)    GERD (gastroesophageal reflux disease)    High cholesterol    History of kidney stones    Hypertension    Mental developmental delay    Onychia of toe    Panic attacks      Past Surgical History:  Procedure Laterality Date   COLONOSCOPY WITH PROPOFOL N/A 10/09/2017   Procedure: COLONOSCOPY WITH PROPOFOL;  Surgeon: Toledo, Boykin Nearing, MD;  Location: [facility];  Service: Gastroenterology;  Laterality: N/A;   COLONOSCOPY WITH PROPOFOL N/A 02/06/2018   Procedure: COLONOSCOPY WITH PROPOFOL;  Surgeon: Toledo, Boykin Nearing, MD;  Location: [facility];  Service: Gastroenterology;  Laterality: N/A;   ESOPHAGOGASTRODUODENOSCOPY (EGD) WITH PROPOFOL N/A 10/09/2017   Procedure: ESOPHAGOGASTRODUODENOSCOPY (EGD) WITH PROPOFOL;  Surgeon: Toledo, Boykin Nearing, MD;  Location: [facility];  Service: Gastroenterology;  Laterality: N/A;   ESOPHAGOGASTRODUODENOSCOPY (EGD) WITH PROPOFOL N/A 07/31/2018   Procedure: ESOPHAGOGASTRODUODENOSCOPY (EGD) WITH PROPOFOL;  Surgeon: Toledo, Boykin Nearing, MD;  Location: [facility];  Service: Gastroenterology;  Laterality: N/A;   ESOPHAGOGASTRODUODENOSCOPY (EGD) WITH PROPOFOL N/A 11/29/2022   Procedure: ESOPHAGOGASTRODUODENOSCOPY (EGD) WITH PROPOFOL;  Surgeon: Toledo, Boykin Nearing, MD;  Location: [facility];  Service: Gastroenterology;  Laterality: N/A;   HAND SURGERY     KIDNEY STONE SURGERY       Current Outpatient Medications  Medication Sig Dispense Refill   acetaminophen (TYLENOL) 325 MG tablet  Take 325 mg by mouth every 6 (six) hours as needed.     albuterol (PROVENTIL HFA;VENTOLIN HFA) 108 (90 BASE) MCG/ACT inhaler Inhale 2 puffs into the lungs every 6 (six) hours as needed for wheezing or shortness of breath. 1 Inhaler 2   azelastine (ASTELIN) 0.1 % nasal spray      benzonatate (TESSALON) 100 MG capsule      budesonide (PULMICORT) 0.5 MG/2ML nebulizer solution Inhale into the lungs.     buPROPion (WELLBUTRIN XL) 300 MG 24 hr tablet      busPIRone (BUSPAR) 30 MG tablet      cetirizine (ZYRTEC) 10 MG tablet TAKE 1 TABLET BY MOUTH EACH NIGHT AT BEDTIME FOR ALLERGY SYMPTOMS 30 tablet 1   cholecalciferol (VITAMIN D) 1000 units tablet Take 1,000 Units by mouth daily.     citalopram (CELEXA) 10 MG tablet Take 10 mg by mouth daily.     dexlansoprazole (DEXILANT) 60 MG capsule Take 60 mg by mouth daily.     Dulaglutide 0.75 MG/0.5ML SOPN Inject into the skin.     Dupilumab 300 MG/2ML SOPN Inject into the skin.     EPINEPHrine 0.3 mg/0.3 mL IJ SOAJ injection      famotidine (PEPCID) 40 MG tablet      fluticasone (FLONASE) 50 MCG/ACT nasal spray      Fluticasone-Umeclidin-Vilant (TRELEGY ELLIPTA) 200-62.5-25 MCG/ACT AEPB Inhale into the lungs.     gabapentin (NEURONTIN) 400 MG capsule      ipratropium (ATROVENT) 0.06 % nasal spray      lactulose (  CHRONULAC) 10 GM/15ML solution Take 30 mLs (20 g total) by mouth daily as needed for mild constipation. 120 mL 0   LINZESS 290 MCG CAPS capsule      LORazepam (ATIVAN) 1 MG tablet Take 1 tablet (1 mg total) by mouth every 8 (eight) hours as needed for anxiety. 15 tablet 0   losartan-hydrochlorothiazide (HYZAAR) 100-12.5 MG tablet TAKE 1 TABLET BY MOUTH DAILY 90 tablet 1   lubiprostone (AMITIZA) 24 MCG capsule Take by mouth.     medroxyPROGESTERone (DEPO-PROVERA) 150 MG/ML injection Inject 150 mg into the muscle every 3 (three) months.     meloxicam (MOBIC) 15 MG tablet Take 1 tablet (15 mg total) by mouth daily. 30 tablet 1   metFORMIN  (GLUCOPHAGE) 500 MG tablet Take 2 tablets by mouth 2 (two) times daily.     MODERNA COVID-19 VACCINE 100 MCG/0.5ML injection      montelukast (SINGULAIR) 10 MG tablet Take 10 mg by mouth daily.     nortriptyline (PAMELOR) 50 MG capsule      ondansetron (ZOFRAN) 4 MG tablet Take 1 tablet by mouth every 8 (eight) hours as needed.     pantoprazole (PROTONIX) 40 MG tablet TAKE 1 TABLET BY MOUTH EVERY DAY 90 tablet 1   Peppermint Oil (IBGARD) 90 MG CPCR TAKE 2 CAPSULES BY MOUTH 2 TIMES A DAY BEFORE MEALS     polyethylene glycol (MIRALAX MIX-IN PAX) 17 g packet Take 17 g by mouth daily. Take 3 times a day for 2 days, then 2 times a day for one week. Then may reduce to 1-2 times a day as needed. 14 each 0   polyethylene glycol powder (GLYCOLAX/MIRALAX) 17 GM/SCOOP powder      potassium chloride SA (K-DUR,KLOR-CON) 20 MEQ tablet Take 20 mEq by mouth daily.     rosuvastatin (CRESTOR) 10 MG tablet TAKE 1 TABLET BY MOUTH ONCE EVERY DAY 90 tablet 3   sennosides-docusate sodium (SENOKOT-S) 8.6-50 MG tablet Take 1 tablet by mouth daily.     simethicone (MYLICON) 125 MG chewable tablet      sucralfate (CARAFATE) 1 g tablet Take 1 g by mouth 4 (four) times daily -  with meals and at bedtime.     TRUE METRIX BLOOD GLUCOSE TEST test strip 1 each 2 (two) times daily.     No current facility-administered medications for this visit.    Allergies:   Patient has no known allergies.    Social History:   reports that she has never smoked. She has never used smokeless tobacco. She reports that she does not currently use alcohol. She reports that she does not use drugs.   Family History:  family history includes Breast cancer (age of onset: 86) in her mother; Diabetes in her father, paternal aunt, paternal grandfather, paternal grandmother, and paternal uncle.    ROS:     Review of Systems  Constitutional: Negative.   HENT: Negative.    Eyes: Negative.   Respiratory: Negative.    Gastrointestinal: Negative.    Genitourinary: Negative.   Musculoskeletal: Negative.   Skin: Negative.   Neurological: Negative.   Endo/Heme/Allergies: Negative.   Psychiatric/Behavioral: Negative.    All other systems reviewed and are negative.     All other systems are reviewed and negative.    PHYSICAL EXAM: VS:  BP 137/62   Pulse 100   Ht 5\' 2"  (1.575 m)   Wt 149 lb 3.2 oz (67.7 kg)   SpO2 98%   BMI 27.29 kg/m  ,  BMI Body mass index is 27.29 kg/m. Last weight:  Wt Readings from Last 3 Encounters:  08/14/23 149 lb 3.2 oz (67.7 kg)  08/01/23 156 lb 9.6 oz (71 kg)  07/24/23 152 lb (68.9 kg)     Physical Exam Constitutional:      Appearance: Normal appearance.  Cardiovascular:     Rate and Rhythm: Normal rate and regular rhythm.     Heart sounds: Normal heart sounds.  Pulmonary:     Effort: Pulmonary effort is normal.     Breath sounds: Normal breath sounds.  Musculoskeletal:     Right lower leg: No edema.     Left lower leg: No edema.  Neurological:     Mental Status: She is alert.       EKG:   Recent Labs: 04/17/2023: ALT 15; BUN 12; Creatinine, Ser 0.59; Potassium 3.8; Sodium 142; TSH 0.845    Lipid Panel    Component Value Date/Time   CHOL 98 (L) 04/17/2023 0953   TRIG 98 04/17/2023 0953   HDL 47 04/17/2023 0953   CHOLHDL 3.5 08/18/2017 0450   VLDL 54 (H) 08/18/2017 0450   LDLCALC 32 04/17/2023 0953      Other studies Reviewed: Additional studies/ records that were reviewed today include:  Review of the above records demonstrates:       No data to display            ASSESSMENT AND PLAN:    ICD-10-CM   1. Benign essential hypertension  I10     2. Obstructive sleep apnea syndrome  G47.33     3. Mixed hyperlipidemia  E78.2     4. Nausea  R11.0     5. Gastroesophageal reflux disease without esophagitis  K21.9     6. Chest pain, non-cardiac  R07.89    GERD causing symptoms       Problem List Items Addressed This Visit       Cardiovascular and  Mediastinum   Benign essential hypertension - Primary     Respiratory   Sleep apnea     Other   Mixed hyperlipidemia   Nausea   Other Visit Diagnoses     Gastroesophageal reflux disease without esophagitis       Chest pain, non-cardiac       GERD causing symptoms          Disposition:   Return in about 3 months (around 11/13/2023).    Total time spent: 40 minutes  Signed,  Adrian Blackwater, MD  08/14/2023 10:50 AM    [facility]

## 2023-08-14 NOTE — Progress Notes (Signed)
Was seen by cards today

## 2023-08-15 DIAGNOSIS — M659 Synovitis and tenosynovitis, unspecified: Secondary | ICD-10-CM | POA: Diagnosis not present

## 2023-08-15 DIAGNOSIS — M79675 Pain in left toe(s): Secondary | ICD-10-CM | POA: Diagnosis not present

## 2023-08-15 DIAGNOSIS — S93401A Sprain of unspecified ligament of right ankle, initial encounter: Secondary | ICD-10-CM | POA: Diagnosis not present

## 2023-08-15 DIAGNOSIS — M79674 Pain in right toe(s): Secondary | ICD-10-CM | POA: Diagnosis not present

## 2023-08-15 DIAGNOSIS — S90122A Contusion of left lesser toe(s) without damage to nail, initial encounter: Secondary | ICD-10-CM | POA: Diagnosis not present

## 2023-08-15 DIAGNOSIS — B351 Tinea unguium: Secondary | ICD-10-CM | POA: Diagnosis not present

## 2023-08-15 DIAGNOSIS — S9032XA Contusion of left foot, initial encounter: Secondary | ICD-10-CM | POA: Diagnosis not present

## 2023-08-21 DIAGNOSIS — J4540 Moderate persistent asthma, uncomplicated: Secondary | ICD-10-CM | POA: Diagnosis not present

## 2023-08-28 ENCOUNTER — Ambulatory Visit: Payer: Medicare HMO | Admitting: Cardiology

## 2023-08-28 ENCOUNTER — Ambulatory Visit (INDEPENDENT_AMBULATORY_CARE_PROVIDER_SITE_OTHER): Payer: Medicare HMO | Admitting: Cardiology

## 2023-08-28 ENCOUNTER — Encounter: Payer: Self-pay | Admitting: Cardiology

## 2023-08-28 VITALS — BP 108/78 | HR 100 | Ht 62.0 in | Wt 153.2 lb

## 2023-08-28 DIAGNOSIS — E1121 Type 2 diabetes mellitus with diabetic nephropathy: Secondary | ICD-10-CM | POA: Diagnosis not present

## 2023-08-28 DIAGNOSIS — Z1151 Encounter for screening for human papillomavirus (HPV): Secondary | ICD-10-CM

## 2023-08-28 DIAGNOSIS — Z1329 Encounter for screening for other suspected endocrine disorder: Secondary | ICD-10-CM

## 2023-08-28 DIAGNOSIS — Z1272 Encounter for screening for malignant neoplasm of vagina: Secondary | ICD-10-CM | POA: Diagnosis not present

## 2023-08-28 DIAGNOSIS — I10 Essential (primary) hypertension: Secondary | ICD-10-CM

## 2023-08-28 DIAGNOSIS — Z0000 Encounter for general adult medical examination without abnormal findings: Secondary | ICD-10-CM | POA: Diagnosis not present

## 2023-08-28 DIAGNOSIS — E782 Mixed hyperlipidemia: Secondary | ICD-10-CM

## 2023-08-28 NOTE — Progress Notes (Signed)
Complete physical exam  Patient: Hannah Clarke   DOB: Oct 30, 1966   57 y.o. Female  MRN: [id number]  Subjective:    Chief Complaint  Patient presents with   Annual Exam    CPE    Hannah Clarke is a 57 y.o. female who presents today for a complete physical exam. She reports consuming a general diet. The patient does not participate in regular exercise at present. She generally feels fairly well. She reports sleeping fairly well. She does not have additional problems to discuss today.    Most recent fall risk assessment:    12/13/2022    3:05 PM  Fall Risk   Falls in the past year? 0     Most recent depression screenings:    08/26/2021   10:25 AM  PHQ 2/9 Scores  PHQ - 2 Score 0    Vision:Within last year and Dental: No current dental problems  Past Medical History:  Diagnosis Date   Anxiety    Chronic headaches    Depression    Dermatophytosis of nail    Diabetes mellitus without complication (HCC)    GERD (gastroesophageal reflux disease)    High cholesterol    History of kidney stones    Hypertension    Mental developmental delay    Onychia of toe    Panic attacks     Past Surgical History:  Procedure Laterality Date   COLONOSCOPY WITH PROPOFOL N/A 10/09/2017   Procedure: COLONOSCOPY WITH PROPOFOL;  Surgeon: Toledo, Boykin Nearing, MD;  Location: [facility];  Service: Gastroenterology;  Laterality: N/A;   COLONOSCOPY WITH PROPOFOL N/A 02/06/2018   Procedure: COLONOSCOPY WITH PROPOFOL;  Surgeon: Toledo, Boykin Nearing, MD;  Location: [facility];  Service: Gastroenterology;  Laterality: N/A;   ESOPHAGOGASTRODUODENOSCOPY (EGD) WITH PROPOFOL N/A 10/09/2017   Procedure: ESOPHAGOGASTRODUODENOSCOPY (EGD) WITH PROPOFOL;  Surgeon: Toledo, Boykin Nearing, MD;  Location: [facility];  Service: Gastroenterology;  Laterality: N/A;   ESOPHAGOGASTRODUODENOSCOPY (EGD) WITH PROPOFOL N/A 07/31/2018   Procedure: ESOPHAGOGASTRODUODENOSCOPY (EGD) WITH PROPOFOL;  Surgeon: Toledo,  Boykin Nearing, MD;  Location: [facility];  Service: Gastroenterology;  Laterality: N/A;   ESOPHAGOGASTRODUODENOSCOPY (EGD) WITH PROPOFOL N/A 11/29/2022   Procedure: ESOPHAGOGASTRODUODENOSCOPY (EGD) WITH PROPOFOL;  Surgeon: Toledo, Boykin Nearing, MD;  Location: [facility];  Service: Gastroenterology;  Laterality: N/A;   HAND SURGERY     KIDNEY STONE SURGERY      Family History  Problem Relation Age of Onset   Breast cancer Mother 82       pre pt.    Diabetes Father    Diabetes Paternal Aunt    Diabetes Paternal Uncle    Diabetes Paternal Grandmother    Diabetes Paternal Grandfather     Social History   Socioeconomic History   Marital status: Single    Spouse name: Not on file   Number of children: Not on file   Years of education: Not on file   Highest education level: Not on file  Occupational History   Not on file  Tobacco Use   Smoking status: Never   Smokeless tobacco: Never  Vaping Use   Vaping status: Never Used  Substance and Sexual Activity   Alcohol use: Not Currently    Comment: OCCASSIONALLY   Drug use: No   Sexual activity: Not on file  Other Topics Concern   Not on file  Social History Narrative   Not on file   Social Determinants of Health   Financial Resource Strain: Not on file  Food Insecurity: No Food Insecurity (07/18/2022)   Hunger Vital Sign    Worried About Running Out of Food in the Last Year: Never true    Ran Out of Food in the Last Year: Never true  Transportation Needs: No Transportation Needs (07/18/2022)   PRAPARE - Administrator, Civil Service (Medical): No    Lack of Transportation (Non-Medical): No  Physical Activity: Not on file  Stress: Not on file  Social Connections: Not on file  Intimate Partner Violence: Not on file    Outpatient Medications Prior to Visit  Medication Sig   acetaminophen (TYLENOL) 325 MG tablet Take 325 mg by mouth every 6 (six) hours as needed.   albuterol (PROVENTIL HFA;VENTOLIN HFA) 108  (90 BASE) MCG/ACT inhaler Inhale 2 puffs into the lungs every 6 (six) hours as needed for wheezing or shortness of breath.   azelastine (ASTELIN) 0.1 % nasal spray    budesonide (PULMICORT) 0.5 MG/2ML nebulizer solution Inhale into the lungs.   buPROPion (WELLBUTRIN XL) 300 MG 24 hr tablet    busPIRone (BUSPAR) 30 MG tablet    cetirizine (ZYRTEC) 10 MG tablet TAKE 1 TABLET BY MOUTH EACH NIGHT AT BEDTIME FOR ALLERGY SYMPTOMS   cholecalciferol (VITAMIN D) 1000 units tablet Take 1,000 Units by mouth daily.   citalopram (CELEXA) 10 MG tablet Take 10 mg by mouth daily.   dexlansoprazole (DEXILANT) 60 MG capsule Take 60 mg by mouth daily.   Dulaglutide 0.75 MG/0.5ML SOPN Inject into the skin.   Dupilumab 300 MG/2ML SOPN Inject into the skin.   famotidine (PEPCID) 40 MG tablet    fluticasone (FLONASE) 50 MCG/ACT nasal spray    Fluticasone-Umeclidin-Vilant (TRELEGY ELLIPTA) 200-62.5-25 MCG/ACT AEPB Inhale into the lungs.   gabapentin (NEURONTIN) 400 MG capsule    ipratropium (ATROVENT) 0.06 % nasal spray    lactulose (CHRONULAC) 10 GM/15ML solution Take 30 mLs (20 g total) by mouth daily as needed for mild constipation.   LINZESS 290 MCG CAPS capsule    LORazepam (ATIVAN) 1 MG tablet Take 1 tablet (1 mg total) by mouth every 8 (eight) hours as needed for anxiety.   losartan-hydrochlorothiazide (HYZAAR) 100-12.5 MG tablet TAKE 1 TABLET BY MOUTH DAILY   lubiprostone (AMITIZA) 24 MCG capsule Take by mouth.   medroxyPROGESTERone (DEPO-PROVERA) 150 MG/ML injection Inject 150 mg into the muscle every 3 (three) months.   meloxicam (MOBIC) 15 MG tablet Take 1 tablet (15 mg total) by mouth daily.   metFORMIN (GLUCOPHAGE) 500 MG tablet Take 2 tablets by mouth 2 (two) times daily.   montelukast (SINGULAIR) 10 MG tablet Take 10 mg by mouth daily.   nortriptyline (PAMELOR) 50 MG capsule    ondansetron (ZOFRAN) 4 MG tablet Take 1 tablet by mouth every 8 (eight) hours as needed.   pantoprazole (PROTONIX) 40 MG  tablet TAKE 1 TABLET BY MOUTH EVERY DAY   Peppermint Oil (IBGARD) 90 MG CPCR TAKE 2 CAPSULES BY MOUTH 2 TIMES A DAY BEFORE MEALS   polyethylene glycol (MIRALAX MIX-IN PAX) 17 g packet Take 17 g by mouth daily. Take 3 times a day for 2 days, then 2 times a day for one week. Then may reduce to 1-2 times a day as needed.   potassium chloride SA (K-DUR,KLOR-CON) 20 MEQ tablet Take 20 mEq by mouth daily.   rosuvastatin (CRESTOR) 10 MG tablet TAKE 1 TABLET BY MOUTH ONCE EVERY DAY   sennosides-docusate sodium (SENOKOT-S) 8.6-50 MG tablet Take 1 tablet by mouth daily.  simethicone (MYLICON) 125 MG chewable tablet    sucralfate (CARAFATE) 1 g tablet Take 1 g by mouth 4 (four) times daily -  with meals and at bedtime.   TRUE METRIX BLOOD GLUCOSE TEST test strip 1 each 2 (two) times daily.   VOQUEZNA 20 MG TABS Take by mouth.   benzonatate (TESSALON) 100 MG capsule  (Patient not taking: Reported on 08/28/2023)   EPINEPHrine 0.3 mg/0.3 mL IJ SOAJ injection  (Patient not taking: Reported on 08/28/2023)   MODERNA COVID-19 VACCINE 100 MCG/0.5ML injection  (Patient not taking: Reported on 08/28/2023)   polyethylene glycol powder (GLYCOLAX/MIRALAX) 17 GM/SCOOP powder  (Patient not taking: Reported on 08/28/2023)   No facility-administered medications prior to visit.    Review of Systems  Constitutional: Negative.   HENT: Negative.    Eyes: Negative.   Respiratory: Negative.  Negative for shortness of breath.   Cardiovascular: Negative.  Negative for chest pain.  Gastrointestinal: Negative.  Negative for abdominal pain, constipation and diarrhea.  Genitourinary: Negative.   Musculoskeletal:  Positive for joint pain. Negative for myalgias.  Skin: Negative.   Neurological: Negative.  Negative for dizziness and headaches.  Endo/Heme/Allergies: Negative.   All other systems reviewed and are negative.       Objective:     BP 108/78   Pulse 100   Ht 5\' 2"  (1.575 m)   Wt 153 lb 3.2 oz (69.5 kg)   SpO2  98%   BMI 28.02 kg/m  BP Readings from Last 3 Encounters:  08/28/23 108/78  08/14/23 137/62  08/01/23 139/81   Wt Readings from Last 3 Encounters:  08/28/23 153 lb 3.2 oz (69.5 kg)  08/14/23 149 lb 3.2 oz (67.7 kg)  08/01/23 156 lb 9.6 oz (71 kg)      Physical Exam Vitals and nursing note reviewed.  Constitutional:      Appearance: Normal appearance. She is normal weight.  HENT:     Head: Normocephalic and atraumatic.     Nose: Nose normal.     Mouth/Throat:     Mouth: Mucous membranes are moist.  Eyes:     Extraocular Movements: Extraocular movements intact.     Conjunctiva/sclera: Conjunctivae normal.     Pupils: Pupils are equal, round, and reactive to light.  Cardiovascular:     Rate and Rhythm: Normal rate and regular rhythm.     Pulses: Normal pulses.     Heart sounds: Normal heart sounds.  Pulmonary:     Effort: Pulmonary effort is normal.     Breath sounds: Normal breath sounds.  Abdominal:     General: Abdomen is flat. Bowel sounds are normal.     Palpations: Abdomen is soft.  Musculoskeletal:        General: Normal range of motion.     Cervical back: Normal range of motion.  Skin:    General: Skin is warm and dry.  Neurological:     General: No focal deficit present.     Mental Status: She is alert and oriented to person, place, and time.  Psychiatric:        Mood and Affect: Mood normal.        Behavior: Behavior normal.        Thought Content: Thought content normal.        Judgment: Judgment normal.      No results found for any visits on 08/28/23.  No results found for this or any previous visit (from the past 2160 hour(s)).  Assessment & Plan:    Routine Health Maintenance and Physical Exam  May stop depo shot, no longer needed.   Immunization History  Administered Date(s) Administered   Influenza, Seasonal, Injecte, Preservative Fre 01/02/2001   Influenza,inj,Quad PF,6+ Mos 09/06/2015, 12/27/2016, 09/05/2017, 08/23/2018,  08/27/2019, 08/16/2020, 09/21/2021, 09/27/2022   Influenza-Unspecified 08/19/2009, 08/22/2010, 08/17/2014   Pneumococcal Polysaccharide-23 01/02/2001, 08/04/2018   Td (Adult),unspecified 03/11/2001   Tdap 01/26/2011, 02/22/2021    Health Maintenance  Topic Date Due   Medicare Annual Wellness (AWV)  Never done   COVID-19 Vaccine (1) Never done   FOOT EXAM  Never done   OPHTHALMOLOGY EXAM  Never done   Diabetic kidney evaluation - Urine ACR  Never done   Hepatitis C Screening  Never done   Zoster Vaccines- Shingrix (1 of 2) Never done   Cervical Cancer Screening (HPV/Pap Cotest)  Never done   INFLUENZA VACCINE  07/05/2023   HEMOGLOBIN A1C  10/18/2023   Diabetic kidney evaluation - eGFR measurement  04/16/2024   MAMMOGRAM  12/19/2024   Colonoscopy  02/07/2028   DTaP/Tdap/Td (3 - Td or Tdap) 02/23/2031   HIV Screening  Completed   HPV VACCINES  Aged Out    Discussed health benefits of physical activity, and encouraged her to engage in regular exercise appropriate for her age and condition.  Problem List Items Addressed This Visit       Other   Encounter for annual health examination - Primary   No follow-ups on file.     Marisue Ivan, NP  08/28/2023   This document may have been prepared by [facility] Voice Recognition software and as such may include unintentional dictation errors.

## 2023-08-31 LAB — IGP, APTIMA HPV
HPV Aptima: NEGATIVE
PAP Smear Comment: 0

## 2023-09-03 ENCOUNTER — Other Ambulatory Visit: Payer: Medicare HMO

## 2023-09-03 DIAGNOSIS — I10 Essential (primary) hypertension: Secondary | ICD-10-CM

## 2023-09-03 DIAGNOSIS — Z1329 Encounter for screening for other suspected endocrine disorder: Secondary | ICD-10-CM

## 2023-09-03 DIAGNOSIS — E1121 Type 2 diabetes mellitus with diabetic nephropathy: Secondary | ICD-10-CM

## 2023-09-03 DIAGNOSIS — E782 Mixed hyperlipidemia: Secondary | ICD-10-CM | POA: Diagnosis not present

## 2023-09-03 NOTE — Progress Notes (Signed)
Patient notified

## 2023-09-04 DIAGNOSIS — E119 Type 2 diabetes mellitus without complications: Secondary | ICD-10-CM | POA: Diagnosis not present

## 2023-09-04 DIAGNOSIS — J4540 Moderate persistent asthma, uncomplicated: Secondary | ICD-10-CM | POA: Diagnosis not present

## 2023-09-04 DIAGNOSIS — S90122A Contusion of left lesser toe(s) without damage to nail, initial encounter: Secondary | ICD-10-CM | POA: Diagnosis not present

## 2023-09-04 DIAGNOSIS — S9032XA Contusion of left foot, initial encounter: Secondary | ICD-10-CM | POA: Diagnosis not present

## 2023-09-04 DIAGNOSIS — S93401A Sprain of unspecified ligament of right ankle, initial encounter: Secondary | ICD-10-CM | POA: Diagnosis not present

## 2023-09-04 DIAGNOSIS — M65979 Unspecified synovitis and tenosynovitis, unspecified ankle and foot: Secondary | ICD-10-CM | POA: Diagnosis not present

## 2023-09-04 LAB — CMP14+EGFR
ALT: 13 [IU]/L (ref 0–32)
AST: 9 [IU]/L (ref 0–40)
Albumin: 4.6 g/dL (ref 3.8–4.9)
Alkaline Phosphatase: 68 [IU]/L (ref 44–121)
BUN/Creatinine Ratio: 17 (ref 9–23)
BUN: 11 mg/dL (ref 6–24)
Bilirubin Total: 0.3 mg/dL (ref 0.0–1.2)
CO2: 22 mmol/L (ref 20–29)
Calcium: 9.8 mg/dL (ref 8.7–10.2)
Chloride: 104 mmol/L (ref 96–106)
Creatinine, Ser: 0.65 mg/dL (ref 0.57–1.00)
Globulin, Total: 2.2 g/dL (ref 1.5–4.5)
Glucose: 122 mg/dL — ABNORMAL HIGH (ref 70–99)
Potassium: 4.1 mmol/L (ref 3.5–5.2)
Sodium: 143 mmol/L (ref 134–144)
Total Protein: 6.8 g/dL (ref 6.0–8.5)
eGFR: 104 mL/min/{1.73_m2} (ref >59)

## 2023-09-04 LAB — LIPID PANEL
Chol/HDL Ratio: 2.1 {ratio} (ref 0.0–4.4)
Cholesterol, Total: 99 mg/dL — ABNORMAL LOW (ref 100–199)
HDL: 48 mg/dL (ref >39)
LDL Chol Calc (NIH): 29 mg/dL (ref 0–99)
Triglycerides: 128 mg/dL (ref 0–149)
VLDL Cholesterol Cal: 22 mg/dL (ref 5–40)

## 2023-09-04 LAB — TSH: TSH: 0.842 u[IU]/mL (ref 0.450–4.500)

## 2023-09-04 LAB — HEMOGLOBIN A1C
Est. average glucose Bld gHb Est-mCnc: 180 mg/dL
Hgb A1c MFr Bld: 7.9 % — ABNORMAL HIGH (ref 4.8–5.6)

## 2023-09-04 NOTE — Progress Notes (Signed)
Patient notified

## 2023-09-05 DIAGNOSIS — M170 Bilateral primary osteoarthritis of knee: Secondary | ICD-10-CM | POA: Diagnosis not present

## 2023-09-06 ENCOUNTER — Other Ambulatory Visit: Payer: Self-pay | Admitting: Family

## 2023-09-06 ENCOUNTER — Other Ambulatory Visit: Payer: Self-pay | Admitting: Cardiology

## 2023-09-18 ENCOUNTER — Telehealth: Payer: Self-pay

## 2023-09-18 DIAGNOSIS — J4540 Moderate persistent asthma, uncomplicated: Secondary | ICD-10-CM | POA: Diagnosis not present

## 2023-09-18 NOTE — Telephone Encounter (Signed)
Pt LM asking for call back, unable to understand what she was asking or needing

## 2023-09-20 ENCOUNTER — Other Ambulatory Visit: Payer: Self-pay

## 2023-09-25 ENCOUNTER — Other Ambulatory Visit: Payer: Self-pay

## 2023-09-25 ENCOUNTER — Ambulatory Visit: Payer: Medicare HMO | Admitting: Cardiology

## 2023-09-25 DIAGNOSIS — G479 Sleep disorder, unspecified: Secondary | ICD-10-CM | POA: Diagnosis not present

## 2023-09-25 DIAGNOSIS — Z309 Encounter for contraceptive management, unspecified: Secondary | ICD-10-CM

## 2023-09-25 DIAGNOSIS — R519 Headache, unspecified: Secondary | ICD-10-CM | POA: Diagnosis not present

## 2023-09-25 DIAGNOSIS — Z3042 Encounter for surveillance of injectable contraceptive: Secondary | ICD-10-CM | POA: Diagnosis not present

## 2023-09-25 DIAGNOSIS — H53149 Visual discomfort, unspecified: Secondary | ICD-10-CM | POA: Diagnosis not present

## 2023-09-25 MED ORDER — MEDROXYPROGESTERONE ACETATE 150 MG/ML IM SUSY
150.0000 mg | PREFILLED_SYRINGE | INTRAMUSCULAR | Status: AC
Start: 2023-09-25 — End: 2023-09-25
  Administered 2023-09-25: 150 mg via INTRAMUSCULAR

## 2023-09-26 ENCOUNTER — Telehealth: Payer: Self-pay | Admitting: Cardiology

## 2023-09-26 DIAGNOSIS — S9032XA Contusion of left foot, initial encounter: Secondary | ICD-10-CM | POA: Diagnosis not present

## 2023-09-26 DIAGNOSIS — M79672 Pain in left foot: Secondary | ICD-10-CM | POA: Diagnosis not present

## 2023-09-26 DIAGNOSIS — E119 Type 2 diabetes mellitus without complications: Secondary | ICD-10-CM | POA: Diagnosis not present

## 2023-09-26 DIAGNOSIS — M65979 Unspecified synovitis and tenosynovitis, unspecified ankle and foot: Secondary | ICD-10-CM | POA: Diagnosis not present

## 2023-09-26 DIAGNOSIS — S93401D Sprain of unspecified ligament of right ankle, subsequent encounter: Secondary | ICD-10-CM | POA: Diagnosis not present

## 2023-09-26 DIAGNOSIS — S90122A Contusion of left lesser toe(s) without damage to nail, initial encounter: Secondary | ICD-10-CM | POA: Diagnosis not present

## 2023-09-26 DIAGNOSIS — M778 Other enthesopathies, not elsewhere classified: Secondary | ICD-10-CM | POA: Diagnosis not present

## 2023-09-26 NOTE — Telephone Encounter (Signed)
Pt called asking for some pain medication for her ankle pain

## 2023-09-27 ENCOUNTER — Telehealth: Payer: Self-pay

## 2023-09-27 ENCOUNTER — Other Ambulatory Visit: Payer: Self-pay | Admitting: Cardiology

## 2023-09-27 MED ORDER — MELOXICAM 15 MG PO TABS: 15.0000 mg | ORAL_TABLET | Freq: Every day | ORAL | 1 refills | Status: DC

## 2023-09-27 NOTE — Telephone Encounter (Signed)
Pt called requesting pain pills for a sprained ankle-please advise-HQ

## 2023-10-01 ENCOUNTER — Telehealth: Payer: Self-pay

## 2023-10-01 ENCOUNTER — Telehealth: Payer: Self-pay | Admitting: Cardiology

## 2023-10-01 ENCOUNTER — Other Ambulatory Visit: Payer: Self-pay | Admitting: Cardiology

## 2023-10-01 MED ORDER — KETOROLAC TROMETHAMINE 10 MG PO TABS: 10.0000 mg | ORAL_TABLET | Freq: Four times a day (QID) | ORAL | 0 refills | Status: AC | PRN

## 2023-10-01 NOTE — Telephone Encounter (Signed)
Patient called back and was notified.

## 2023-10-01 NOTE — Telephone Encounter (Signed)
Greggory Stallion from [facility] left VM that they received an Rx today for ketorolac for the patient. Patient is already taking meloxicam and cannot take both meds together. The pharmacy would like to know if they should d/c her meloxicam for the time that she is using the ketorolac. Please advise.

## 2023-10-03 ENCOUNTER — Other Ambulatory Visit: Payer: Self-pay | Admitting: Cardiovascular Disease

## 2023-10-03 DIAGNOSIS — J4540 Moderate persistent asthma, uncomplicated: Secondary | ICD-10-CM | POA: Diagnosis not present

## 2023-10-09 DIAGNOSIS — M25542 Pain in joints of left hand: Secondary | ICD-10-CM | POA: Diagnosis not present

## 2023-10-09 DIAGNOSIS — M25541 Pain in joints of right hand: Secondary | ICD-10-CM | POA: Diagnosis not present

## 2023-10-15 ENCOUNTER — Other Ambulatory Visit: Payer: Self-pay

## 2023-10-15 MED ORDER — POLYETHYLENE GLYCOL 3350 17 G PO PACK: 17.0000 g | PACK | Freq: Every day | ORAL | 0 refills | Status: AC

## 2023-10-16 DIAGNOSIS — J4540 Moderate persistent asthma, uncomplicated: Secondary | ICD-10-CM | POA: Diagnosis not present

## 2023-10-30 DIAGNOSIS — J4540 Moderate persistent asthma, uncomplicated: Secondary | ICD-10-CM | POA: Diagnosis not present

## 2023-10-31 ENCOUNTER — Other Ambulatory Visit: Payer: Self-pay | Admitting: Cardiology

## 2023-11-06 DIAGNOSIS — K581 Irritable bowel syndrome with constipation: Secondary | ICD-10-CM | POA: Diagnosis not present

## 2023-11-06 DIAGNOSIS — R4189 Other symptoms and signs involving cognitive functions and awareness: Secondary | ICD-10-CM | POA: Diagnosis not present

## 2023-11-06 DIAGNOSIS — K2100 Gastro-esophageal reflux disease with esophagitis, without bleeding: Secondary | ICD-10-CM | POA: Diagnosis not present

## 2023-11-06 DIAGNOSIS — K219 Gastro-esophageal reflux disease without esophagitis: Secondary | ICD-10-CM | POA: Diagnosis not present

## 2023-11-06 DIAGNOSIS — R110 Nausea: Secondary | ICD-10-CM | POA: Diagnosis not present

## 2023-11-06 DIAGNOSIS — K449 Diaphragmatic hernia without obstruction or gangrene: Secondary | ICD-10-CM | POA: Diagnosis not present

## 2023-11-06 DIAGNOSIS — J8283 Eosinophilic asthma: Secondary | ICD-10-CM | POA: Diagnosis not present

## 2023-11-07 DIAGNOSIS — M722 Plantar fascial fibromatosis: Secondary | ICD-10-CM | POA: Diagnosis not present

## 2023-11-07 DIAGNOSIS — S93401D Sprain of unspecified ligament of right ankle, subsequent encounter: Secondary | ICD-10-CM | POA: Diagnosis not present

## 2023-11-07 DIAGNOSIS — E119 Type 2 diabetes mellitus without complications: Secondary | ICD-10-CM | POA: Diagnosis not present

## 2023-11-07 DIAGNOSIS — M65979 Unspecified synovitis and tenosynovitis, unspecified ankle and foot: Secondary | ICD-10-CM | POA: Diagnosis not present

## 2023-11-12 ENCOUNTER — Encounter: Payer: Self-pay | Admitting: Cardiovascular Disease

## 2023-11-12 ENCOUNTER — Ambulatory Visit (INDEPENDENT_AMBULATORY_CARE_PROVIDER_SITE_OTHER): Payer: Medicare HMO | Admitting: Cardiovascular Disease

## 2023-11-12 ENCOUNTER — Ambulatory Visit: Payer: Medicare HMO | Admitting: Cardiovascular Disease

## 2023-11-12 VITALS — BP 112/74 | HR 104 | Ht 62.0 in | Wt 147.0 lb

## 2023-11-12 DIAGNOSIS — I4711 Inappropriate sinus tachycardia, so stated: Secondary | ICD-10-CM

## 2023-11-12 DIAGNOSIS — R0602 Shortness of breath: Secondary | ICD-10-CM

## 2023-11-12 DIAGNOSIS — E1121 Type 2 diabetes mellitus with diabetic nephropathy: Secondary | ICD-10-CM

## 2023-11-12 DIAGNOSIS — I10 Essential (primary) hypertension: Secondary | ICD-10-CM | POA: Diagnosis not present

## 2023-11-12 DIAGNOSIS — E782 Mixed hyperlipidemia: Secondary | ICD-10-CM | POA: Diagnosis not present

## 2023-11-12 MED ORDER — METOPROLOL TARTRATE 25 MG PO TABS
25.0000 mg | ORAL_TABLET | Freq: Two times a day (BID) | ORAL | 11 refills | Status: DC
Start: 2023-11-12 — End: 2024-03-18

## 2023-11-12 NOTE — Progress Notes (Addendum)
Cardiology Office Note   Date:  11/12/2023   ID:  Hannah, Clarke [birth year], MRN [id number]  PCP:  Marisue Ivan, NP  Cardiologist:  Adrian Blackwater, MD      History of Present Illness: Hannah Clarke is a 57 y.o. female who presents for  Chief Complaint  Patient presents with  . Follow-up    Doing well     Past Medical History:  Diagnosis Date  . Anxiety   . Chronic headaches   . Depression   . Dermatophytosis of nail   . Diabetes mellitus without complication (HCC)   . GERD (gastroesophageal reflux disease)   . High cholesterol   . History of kidney stones   . Hypertension   . Mental developmental delay   . Onychia of toe   . Panic attacks      Past Surgical History:  Procedure Laterality Date  . COLONOSCOPY WITH PROPOFOL N/A 10/09/2017   Procedure: COLONOSCOPY WITH PROPOFOL;  Surgeon: Toledo, Boykin Nearing, MD;  Location: [facility];  Service: Gastroenterology;  Laterality: N/A;  . COLONOSCOPY WITH PROPOFOL N/A 02/06/2018   Procedure: COLONOSCOPY WITH PROPOFOL;  Surgeon: Toledo, Boykin Nearing, MD;  Location: [facility];  Service: Gastroenterology;  Laterality: N/A;  . ESOPHAGOGASTRODUODENOSCOPY (EGD) WITH PROPOFOL N/A 10/09/2017   Procedure: ESOPHAGOGASTRODUODENOSCOPY (EGD) WITH PROPOFOL;  Surgeon: Toledo, Boykin Nearing, MD;  Location: [facility];  Service: Gastroenterology;  Laterality: N/A;  . ESOPHAGOGASTRODUODENOSCOPY (EGD) WITH PROPOFOL N/A 07/31/2018   Procedure: ESOPHAGOGASTRODUODENOSCOPY (EGD) WITH PROPOFOL;  Surgeon: Toledo, Boykin Nearing, MD;  Location: [facility];  Service: Gastroenterology;  Laterality: N/A;  . ESOPHAGOGASTRODUODENOSCOPY (EGD) WITH PROPOFOL N/A 11/29/2022   Procedure: ESOPHAGOGASTRODUODENOSCOPY (EGD) WITH PROPOFOL;  Surgeon: Toledo, Boykin Nearing, MD;  Location: [facility];  Service: Gastroenterology;  Laterality: N/A;  . HAND SURGERY    . KIDNEY STONE SURGERY       Current Outpatient Medications  Medication Sig Dispense  Refill  . metoprolol tartrate (LOPRESSOR) 25 MG tablet Take 1 tablet (25 mg total) by mouth 2 (two) times daily. 60 tablet 11  . acetaminophen (TYLENOL) 325 MG tablet Take 325 mg by mouth every 6 (six) hours as needed.    Marland Kitchen albuterol (PROVENTIL HFA;VENTOLIN HFA) 108 (90 BASE) MCG/ACT inhaler Inhale 2 puffs into the lungs every 6 (six) hours as needed for wheezing or shortness of breath. 1 Inhaler 2  . azelastine (ASTELIN) 0.1 % nasal spray     . budesonide (PULMICORT) 0.5 MG/2ML nebulizer solution Inhale into the lungs.    Marland Kitchen buPROPion (WELLBUTRIN XL) 300 MG 24 hr tablet     . busPIRone (BUSPAR) 30 MG tablet     . cetirizine (ZYRTEC) 10 MG tablet TAKE 1 TABLET BY MOUTH EACH NIGHT AT BEDTIME FOR ALLERGY SYMPTOMS 30 tablet 11  . Cholecalciferol (GNP VITAMIN D3 EXTRA STRENGTH) 25 MCG (1000 UT) tablet TAKE 1 TABLET BY MOUTH ONCE EVERY DAY 30 tablet 11  . citalopram (CELEXA) 10 MG tablet Take 10 mg by mouth daily.    . Dulaglutide 0.75 MG/0.5ML SOPN Inject into the skin.    . Dupilumab 300 MG/2ML SOPN Inject into the skin.    Marland Kitchen EPINEPHrine 0.3 mg/0.3 mL IJ SOAJ injection  (Patient not taking: Reported on 08/28/2023)    . famotidine (PEPCID) 40 MG tablet     . fluticasone (FLONASE) 50 MCG/ACT nasal spray     . Fluticasone-Umeclidin-Vilant (TRELEGY ELLIPTA) 200-62.5-25 MCG/ACT AEPB Inhale into the lungs.    . gabapentin (NEURONTIN) 400 MG  capsule     . ipratropium (ATROVENT) 0.06 % nasal spray     . ketorolac (TORADOL) 10 MG tablet Take 1 tablet (10 mg total) by mouth every 6 (six) hours as needed. 20 tablet 0  . lactulose (CHRONULAC) 10 GM/15ML solution Take 30 mLs (20 g total) by mouth daily as needed for mild constipation. 120 mL 0  . LINZESS 290 MCG CAPS capsule     . LORazepam (ATIVAN) 1 MG tablet Take 1 tablet (1 mg total) by mouth every 8 (eight) hours as needed for anxiety. 15 tablet 0  . losartan-hydrochlorothiazide (HYZAAR) 100-12.5 MG tablet TAKE 1 TABLET BY MOUTH DAILY 90 tablet 1  .  lubiprostone (AMITIZA) 24 MCG capsule Take by mouth.    . medroxyPROGESTERone (DEPO-PROVERA) 150 MG/ML injection Inject 150 mg into the muscle every 3 (three) months.    . metFORMIN (GLUCOPHAGE) 500 MG tablet Take 2 tablets by mouth 2 (two) times daily.    . montelukast (SINGULAIR) 10 MG tablet Take 10 mg by mouth daily.    . nortriptyline (PAMELOR) 50 MG capsule     . ondansetron (ZOFRAN) 4 MG tablet Take 1 tablet by mouth every 8 (eight) hours as needed.    . pantoprazole (PROTONIX) 40 MG tablet TAKE 1 TABLET BY MOUTH EVERY DAY (Patient taking differently: Take 40 mg by mouth 2 (two) times daily. Take 40mg  twice daily once in the morning before breakfast and once at dinner ON AN EMPTY STOMACH) 90 tablet 1  . Peppermint Oil (IBGARD) 90 MG CPCR TAKE 2 CAPSULES BY MOUTH 2 TIMES A DAY BEFORE MEALS    . polyethylene glycol (MIRALAX MIX-IN PAX) 17 g packet Take 17 g by mouth daily. Take 3 times a day for 2 days, then 2 times a day for one week. Then may reduce to 1-2 times a day as needed. 14 each 0  . potassium chloride SA (K-DUR,KLOR-CON) 20 MEQ tablet Take 20 mEq by mouth daily.    . rosuvastatin (CRESTOR) 10 MG tablet TAKE 1 TABLET BY MOUTH ONCE EVERY DAY 90 tablet 3  . sennosides-docusate sodium (SENOKOT-S) 8.6-50 MG tablet Take 1 tablet by mouth daily.    . simethicone (MYLICON) 125 MG chewable tablet     . sucralfate (CARAFATE) 1 g tablet Take 1 g by mouth 4 (four) times daily -  with meals and at bedtime.    . TRUE METRIX BLOOD GLUCOSE TEST test strip 1 each 2 (two) times daily.    . VOQUEZNA 20 MG TABS Take by mouth.     No current facility-administered medications for this visit.    Allergies:   Patient has no known allergies.    Social History:   reports that she has never smoked. She has never used smokeless tobacco. She reports that she does not currently use alcohol. She reports that she does not use drugs.   Family History:  family history includes Breast cancer (age of onset: 61)  in her mother; Diabetes in her father, paternal aunt, paternal grandfather, paternal grandmother, and paternal uncle.    ROS:     Review of Systems  Constitutional: Negative.   HENT: Negative.    Eyes: Negative.   Respiratory: Negative.    Gastrointestinal: Negative.   Genitourinary: Negative.   Musculoskeletal: Negative.   Skin: Negative.   Neurological: Negative.   Endo/Heme/Allergies: Negative.   Psychiatric/Behavioral: Negative.    All other systems reviewed and are negative.     All other systems are reviewed  and negative.    PHYSICAL EXAM: VS:  BP 112/74   Pulse (!) 104   Ht 5\' 2"  (1.575 m)   Wt 147 lb (66.7 kg)   SpO2 98%   BMI 26.89 kg/m  , BMI Body mass index is 26.89 kg/m. Last weight:  Wt Readings from Last 3 Encounters:  11/12/23 147 lb (66.7 kg)  08/28/23 153 lb 3.2 oz (69.5 kg)  08/14/23 149 lb 3.2 oz (67.7 kg)     Physical Exam Constitutional:      Appearance: Normal appearance.  Cardiovascular:     Rate and Rhythm: Normal rate and regular rhythm.     Heart sounds: Normal heart sounds.  Pulmonary:     Effort: Pulmonary effort is normal.     Breath sounds: Normal breath sounds.  Musculoskeletal:     Right lower leg: No edema.     Left lower leg: No edema.  Neurological:     Mental Status: She is alert.      EKG:   Recent Labs: 09/03/2023: ALT 13; BUN 11; Creatinine, Ser 0.65; Potassium 4.1; Sodium 143; TSH 0.842    Lipid Panel    Component Value Date/Time   CHOL 99 (L) 09/03/2023 1213   TRIG 128 09/03/2023 1213   HDL 48 09/03/2023 1213   CHOLHDL 2.1 09/03/2023 1213   CHOLHDL 3.5 08/18/2017 0450   VLDL 54 (H) 08/18/2017 0450   LDLCALC 29 09/03/2023 1213      Other studies Reviewed: Additional studies/ records that were reviewed today include:  Review of the above records demonstrates:       No data to display            ASSESSMENT AND PLAN:    ICD-10-CM   1. Benign essential hypertension  I10 metoprolol tartrate  (LOPRESSOR) 25 MG tablet    2. Type 2 diabetes mellitus with diabetic nephropathy, without long-term current use of insulin (HCC)  E11.21 metoprolol tartrate (LOPRESSOR) 25 MG tablet    3. Mixed hyperlipidemia  E78.2 metoprolol tartrate (LOPRESSOR) 25 MG tablet    4. SOB (shortness of breath)  R06.02 metoprolol tartrate (LOPRESSOR) 25 MG tablet    5. Inappropriate sinus tachycardia (HCC)  I47.11 metoprolol tartrate (LOPRESSOR) 25 MG tablet   start metoprolol       Problem List Items Addressed This Visit       Cardiovascular and Mediastinum   Benign essential hypertension - Primary   Relevant Medications   metoprolol tartrate (LOPRESSOR) 25 MG tablet     Endocrine   Type 2 diabetes mellitus with diabetic nephropathy (HCC)   Relevant Medications   metoprolol tartrate (LOPRESSOR) 25 MG tablet     Other   Mixed hyperlipidemia   Relevant Medications   metoprolol tartrate (LOPRESSOR) 25 MG tablet   Other Visit Diagnoses     SOB (shortness of breath)       Relevant Medications   metoprolol tartrate (LOPRESSOR) 25 MG tablet   Inappropriate sinus tachycardia (HCC)       start metoprolol   Relevant Medications   metoprolol tartrate (LOPRESSOR) 25 MG tablet          Disposition:   Return in about 4 weeks (around 12/10/2023).    Total time spent: 30 minutes  Signed,  Adrian Blackwater, MD  11/12/2023 1:51 PM    [facility]

## 2023-11-13 DIAGNOSIS — M25572 Pain in left ankle and joints of left foot: Secondary | ICD-10-CM | POA: Diagnosis not present

## 2023-11-13 DIAGNOSIS — Z87828 Personal history of other (healed) physical injury and trauma: Secondary | ICD-10-CM | POA: Diagnosis not present

## 2023-11-13 DIAGNOSIS — M25571 Pain in right ankle and joints of right foot: Secondary | ICD-10-CM | POA: Diagnosis not present

## 2023-11-14 DIAGNOSIS — J4540 Moderate persistent asthma, uncomplicated: Secondary | ICD-10-CM | POA: Diagnosis not present

## 2023-11-19 DIAGNOSIS — Z87828 Personal history of other (healed) physical injury and trauma: Secondary | ICD-10-CM | POA: Diagnosis not present

## 2023-11-19 DIAGNOSIS — M25571 Pain in right ankle and joints of right foot: Secondary | ICD-10-CM | POA: Diagnosis not present

## 2023-11-21 DIAGNOSIS — G479 Sleep disorder, unspecified: Secondary | ICD-10-CM | POA: Diagnosis not present

## 2023-11-21 DIAGNOSIS — R519 Headache, unspecified: Secondary | ICD-10-CM | POA: Diagnosis not present

## 2023-11-21 DIAGNOSIS — R110 Nausea: Secondary | ICD-10-CM | POA: Diagnosis not present

## 2023-11-21 DIAGNOSIS — M25571 Pain in right ankle and joints of right foot: Secondary | ICD-10-CM | POA: Diagnosis not present

## 2023-11-21 DIAGNOSIS — Z87828 Personal history of other (healed) physical injury and trauma: Secondary | ICD-10-CM | POA: Diagnosis not present

## 2023-11-27 DIAGNOSIS — M25571 Pain in right ankle and joints of right foot: Secondary | ICD-10-CM | POA: Diagnosis not present

## 2023-11-27 DIAGNOSIS — Z87828 Personal history of other (healed) physical injury and trauma: Secondary | ICD-10-CM | POA: Diagnosis not present

## 2023-11-30 DIAGNOSIS — J4540 Moderate persistent asthma, uncomplicated: Secondary | ICD-10-CM | POA: Diagnosis not present

## 2023-12-04 DIAGNOSIS — M25571 Pain in right ankle and joints of right foot: Secondary | ICD-10-CM | POA: Diagnosis not present

## 2023-12-04 DIAGNOSIS — Z87828 Personal history of other (healed) physical injury and trauma: Secondary | ICD-10-CM | POA: Diagnosis not present

## 2023-12-04 DIAGNOSIS — E119 Type 2 diabetes mellitus without complications: Secondary | ICD-10-CM | POA: Diagnosis not present

## 2023-12-04 DIAGNOSIS — M25572 Pain in left ankle and joints of left foot: Secondary | ICD-10-CM | POA: Diagnosis not present

## 2023-12-04 DIAGNOSIS — M7672 Peroneal tendinitis, left leg: Secondary | ICD-10-CM | POA: Diagnosis not present

## 2023-12-04 DIAGNOSIS — S93401D Sprain of unspecified ligament of right ankle, subsequent encounter: Secondary | ICD-10-CM | POA: Diagnosis not present

## 2023-12-10 DIAGNOSIS — M170 Bilateral primary osteoarthritis of knee: Secondary | ICD-10-CM | POA: Diagnosis not present

## 2023-12-11 ENCOUNTER — Ambulatory Visit: Payer: Medicare HMO | Admitting: Cardiovascular Disease

## 2023-12-11 DIAGNOSIS — M25571 Pain in right ankle and joints of right foot: Secondary | ICD-10-CM | POA: Diagnosis not present

## 2023-12-11 DIAGNOSIS — Z87828 Personal history of other (healed) physical injury and trauma: Secondary | ICD-10-CM | POA: Diagnosis not present

## 2023-12-13 ENCOUNTER — Other Ambulatory Visit: Payer: Self-pay

## 2023-12-13 MED ORDER — FLUTICASONE PROPIONATE 50 MCG/ACT NA SUSP: 1.0000 | Freq: Every day | NASAL | 11 refills | Status: AC

## 2023-12-13 MED ORDER — AZELASTINE HCL 0.1 % NA SOLN: 1.0000 | Freq: Two times a day (BID) | NASAL | 11 refills | Status: AC

## 2023-12-14 ENCOUNTER — Ambulatory Visit: Payer: Medicare HMO | Admitting: Cardiovascular Disease

## 2023-12-18 ENCOUNTER — Encounter: Payer: Self-pay | Admitting: Cardiovascular Disease

## 2023-12-18 ENCOUNTER — Ambulatory Visit (INDEPENDENT_AMBULATORY_CARE_PROVIDER_SITE_OTHER): Payer: Medicare HMO | Admitting: Cardiovascular Disease

## 2023-12-18 ENCOUNTER — Other Ambulatory Visit: Payer: Self-pay

## 2023-12-18 ENCOUNTER — Ambulatory Visit: Payer: Medicare HMO | Admitting: Cardiovascular Disease

## 2023-12-18 VITALS — BP 120/80 | HR 100 | Ht 62.0 in | Wt 151.2 lb

## 2023-12-18 DIAGNOSIS — R0602 Shortness of breath: Secondary | ICD-10-CM | POA: Diagnosis not present

## 2023-12-18 DIAGNOSIS — I4711 Inappropriate sinus tachycardia, so stated: Secondary | ICD-10-CM

## 2023-12-18 DIAGNOSIS — E782 Mixed hyperlipidemia: Secondary | ICD-10-CM | POA: Diagnosis not present

## 2023-12-18 DIAGNOSIS — I10 Essential (primary) hypertension: Secondary | ICD-10-CM

## 2023-12-18 DIAGNOSIS — E1121 Type 2 diabetes mellitus with diabetic nephropathy: Secondary | ICD-10-CM

## 2023-12-18 DIAGNOSIS — R0789 Other chest pain: Secondary | ICD-10-CM | POA: Diagnosis not present

## 2023-12-18 NOTE — Progress Notes (Signed)
 Cardiology Office Note   Date:  12/18/2023   ID:  Hannah Clarke, DOB 1966/05/12, MRN [id number]  PCP:  Carin Gauze, NP  Cardiologist:  Denyse Bathe, MD      History of Present Illness: Hannah Clarke is a 58 y.o. female who presents for  Chief Complaint  Patient presents with   Follow-up    4 week follow up    Has foot hurting      Past Medical History:  Diagnosis Date   Anxiety    Chronic headaches    Depression    Dermatophytosis of nail    Diabetes mellitus without complication (HCC)    GERD (gastroesophageal reflux disease)    High cholesterol    History of kidney stones    Hypertension    Mental developmental delay    Onychia of toe    Panic attacks      Past Surgical History:  Procedure Laterality Date   COLONOSCOPY WITH PROPOFOL  N/A 10/09/2017   Procedure: COLONOSCOPY WITH PROPOFOL ;  Surgeon: Toledo, Ladell POUR, MD;  Location: [facility];  Service: Gastroenterology;  Laterality: N/A;   COLONOSCOPY WITH PROPOFOL  N/A 02/06/2018   Procedure: COLONOSCOPY WITH PROPOFOL ;  Surgeon: Toledo, Ladell POUR, MD;  Location: [facility];  Service: Gastroenterology;  Laterality: N/A;   ESOPHAGOGASTRODUODENOSCOPY (EGD) WITH PROPOFOL  N/A 10/09/2017   Procedure: ESOPHAGOGASTRODUODENOSCOPY (EGD) WITH PROPOFOL ;  Surgeon: Toledo, Ladell POUR, MD;  Location: [facility];  Service: Gastroenterology;  Laterality: N/A;   ESOPHAGOGASTRODUODENOSCOPY (EGD) WITH PROPOFOL  N/A 07/31/2018   Procedure: ESOPHAGOGASTRODUODENOSCOPY (EGD) WITH PROPOFOL ;  Surgeon: Toledo, Ladell POUR, MD;  Location: [facility];  Service: Gastroenterology;  Laterality: N/A;   ESOPHAGOGASTRODUODENOSCOPY (EGD) WITH PROPOFOL  N/A 11/29/2022   Procedure: ESOPHAGOGASTRODUODENOSCOPY (EGD) WITH PROPOFOL ;  Surgeon: Toledo, Ladell POUR, MD;  Location: [facility];  Service: Gastroenterology;  Laterality: N/A;   HAND SURGERY     KIDNEY STONE SURGERY       Current Outpatient Medications  Medication Sig  Dispense Refill   acetaminophen  (TYLENOL ) 325 MG tablet Take 325 mg by mouth every 6 (six) hours as needed.     albuterol  (PROVENTIL  HFA;VENTOLIN  HFA) 108 (90 BASE) MCG/ACT inhaler Inhale 2 puffs into the lungs every 6 (six) hours as needed for wheezing or shortness of breath. 1 Inhaler 2   azelastine  (ASTELIN ) 0.1 % nasal spray Place 1 spray into both nostrils 2 (two) times daily. 30 mL 11   budesonide (PULMICORT) 0.5 MG/2ML nebulizer solution Inhale into the lungs.     buPROPion  (WELLBUTRIN  XL) 300 MG 24 hr tablet      busPIRone  (BUSPAR ) 30 MG tablet      cetirizine (ZYRTEC) 10 MG tablet TAKE 1 TABLET BY MOUTH EACH NIGHT AT BEDTIME FOR ALLERGY SYMPTOMS 30 tablet 11   Cholecalciferol  (GNP VITAMIN D3 EXTRA STRENGTH) 25 MCG (1000 UT) tablet TAKE 1 TABLET BY MOUTH ONCE EVERY DAY 30 tablet 11   citalopram  (CELEXA ) 10 MG tablet Take 10 mg by mouth daily.     Dulaglutide 0.75 MG/0.5ML SOPN Inject into the skin.     Dupilumab 300 MG/2ML SOPN Inject into the skin.     famotidine (PEPCID) 40 MG tablet      fluticasone  (FLONASE ) 50 MCG/ACT nasal spray Place 1 spray into both nostrils daily. 16 g 11   Fluticasone -Umeclidin-Vilant (TRELEGY ELLIPTA) 200-62.5-25 MCG/ACT AEPB Inhale into the lungs.     gabapentin  (NEURONTIN ) 400 MG capsule      ipratropium (ATROVENT) 0.06 % nasal spray  ketorolac  (TORADOL ) 10 MG tablet Take 1 tablet (10 mg total) by mouth every 6 (six) hours as needed. 20 tablet 0   lactulose  (CHRONULAC ) 10 GM/15ML solution Take 30 mLs (20 g total) by mouth daily as needed for mild constipation. 120 mL 0   LINZESS  290 MCG CAPS capsule      LORazepam  (ATIVAN ) 1 MG tablet Take 1 tablet (1 mg total) by mouth every 8 (eight) hours as needed for anxiety. 15 tablet 0   losartan -hydrochlorothiazide (HYZAAR) 100-12.5 MG tablet TAKE 1 TABLET BY MOUTH DAILY 90 tablet 1   lubiprostone (AMITIZA) 24 MCG capsule Take by mouth.     medroxyPROGESTERone  (DEPO-PROVERA ) 150 MG/ML injection Inject 150 mg  into the muscle every 3 (three) months.     metFORMIN (GLUCOPHAGE) 500 MG tablet Take 2 tablets by mouth 2 (two) times daily.     metoprolol  tartrate (LOPRESSOR ) 25 MG tablet Take 1 tablet (25 mg total) by mouth 2 (two) times daily. 60 tablet 11   montelukast  (SINGULAIR ) 10 MG tablet Take 10 mg by mouth daily.     nortriptyline (PAMELOR) 50 MG capsule      ondansetron  (ZOFRAN ) 4 MG tablet Take 1 tablet by mouth every 8 (eight) hours as needed.     pantoprazole  (PROTONIX ) 40 MG tablet TAKE 1 TABLET BY MOUTH EVERY DAY (Patient taking differently: Take 40 mg by mouth 2 (two) times daily. Take 40mg  twice daily once in the morning before breakfast and once at dinner ON AN EMPTY STOMACH) 90 tablet 1   Peppermint Oil (IBGARD) 90 MG CPCR TAKE 2 CAPSULES BY MOUTH 2 TIMES A DAY BEFORE MEALS     polyethylene glycol (MIRALAX  MIX-IN PAX) 17 g packet Take 17 g by mouth daily. Take 3 times a day for 2 days, then 2 times a day for one week. Then may reduce to 1-2 times a day as needed. 14 each 0   potassium chloride  SA (K-DUR,KLOR-CON ) 20 MEQ tablet Take 20 mEq by mouth daily.     rosuvastatin (CRESTOR) 10 MG tablet TAKE 1 TABLET BY MOUTH ONCE EVERY DAY 90 tablet 3   sennosides-docusate sodium  (SENOKOT-S) 8.6-50 MG tablet Take 1 tablet by mouth daily.     simethicone  (MYLICON) 125 MG chewable tablet      sucralfate  (CARAFATE ) 1 g tablet Take 1 g by mouth 4 (four) times daily -  with meals and at bedtime.     TRUE METRIX BLOOD GLUCOSE TEST test strip 1 each 2 (two) times daily.     VOQUEZNA 20 MG TABS Take by mouth.     EPINEPHrine 0.3 mg/0.3 mL IJ SOAJ injection  (Patient not taking: Reported on 12/18/2023)     No current facility-administered medications for this visit.    Allergies:   Patient has no known allergies.    Social History:   reports that she has never smoked. She has never used smokeless tobacco. She reports that she does not currently use alcohol. She reports that she does not use drugs.    Family History:  family history includes Breast cancer (age of onset: 9) in her mother; Diabetes in her father, paternal aunt, paternal grandfather, paternal grandmother, and paternal uncle.    ROS:     Review of Systems  Constitutional: Negative.   HENT: Negative.    Eyes: Negative.   Respiratory: Negative.    Gastrointestinal: Negative.   Genitourinary: Negative.   Musculoskeletal: Negative.   Skin: Negative.   Neurological: Negative.   Endo/Heme/Allergies:  Negative.   Psychiatric/Behavioral: Negative.    All other systems reviewed and are negative.     All other systems are reviewed and negative.    PHYSICAL EXAM: VS:  BP 120/80   Pulse 100   Ht 5' 2 (1.575 m)   Wt 151 lb 3.2 oz (68.6 kg)   SpO2 97%   BMI 27.65 kg/m  , BMI Body mass index is 27.65 kg/m. Last weight:  Wt Readings from Last 3 Encounters:  12/18/23 151 lb 3.2 oz (68.6 kg)  11/12/23 147 lb (66.7 kg)  08/28/23 153 lb 3.2 oz (69.5 kg)     Physical Exam Constitutional:      Appearance: Normal appearance.  Cardiovascular:     Rate and Rhythm: Normal rate and regular rhythm.     Heart sounds: Normal heart sounds.  Pulmonary:     Effort: Pulmonary effort is normal.     Breath sounds: Normal breath sounds.  Musculoskeletal:     Right lower leg: No edema.     Left lower leg: No edema.  Neurological:     Mental Status: She is alert.       EKG:   Recent Labs: 09/03/2023: ALT 13; BUN 11; Creatinine, Ser 0.65; Potassium 4.1; Sodium 143; TSH 0.842    Lipid Panel    Component Value Date/Time   CHOL 99 (L) 09/03/2023 1213   TRIG 128 09/03/2023 1213   HDL 48 09/03/2023 1213   CHOLHDL 2.1 09/03/2023 1213   CHOLHDL 3.5 08/18/2017 0450   VLDL 54 (H) 08/18/2017 0450   LDLCALC 29 09/03/2023 1213      Other studies Reviewed: Additional studies/ records that were reviewed today include:  Review of the above records demonstrates:       No data to display            ASSESSMENT AND  PLAN:    ICD-10-CM   1. Benign essential hypertension  I10     2. Mixed hyperlipidemia  E78.2     3. Type 2 diabetes mellitus with diabetic nephropathy, without long-term current use of insulin  (HCC)  E11.21     4. Inappropriate sinus tachycardia (HCC)  I47.11    improving with metoprolol     5. SOB (shortness of breath)  R06.02    SOB getting better    6. Other chest pain  R07.89    chest pain has improved       Problem List Items Addressed This Visit       Cardiovascular and Mediastinum   Benign essential hypertension - Primary     Endocrine   Type 2 diabetes mellitus with diabetic nephropathy (HCC)     Other   Mixed hyperlipidemia   Other Visit Diagnoses       Inappropriate sinus tachycardia (HCC)       improving with metoprolol      SOB (shortness of breath)       SOB getting better     Other chest pain       chest pain has improved          Disposition:   Return in about 3 months (around 03/17/2024).    Total time spent: 30 minutes  Signed,  Denyse Bathe, MD  12/18/2023 10:23 AM    [facility]

## 2023-12-18 NOTE — Addendum Note (Signed)
 Addended by: Feliberto Harts on: 12/18/2023 10:41 AM   Modules accepted: Orders

## 2023-12-19 DIAGNOSIS — M170 Bilateral primary osteoarthritis of knee: Secondary | ICD-10-CM | POA: Diagnosis not present

## 2023-12-19 LAB — CBC WITH DIFFERENTIAL/PLATELET
Basophils Absolute: 0.1 10*3/uL (ref 0.0–0.2)
Basos: 1 %
EOS (ABSOLUTE): 0.4 10*3/uL (ref 0.0–0.4)
Eos: 7 %
Hematocrit: 29.5 % — ABNORMAL LOW (ref 34.0–46.6)
Hemoglobin: 9.6 g/dL — ABNORMAL LOW (ref 11.1–15.9)
Immature Grans (Abs): 0 10*3/uL (ref 0.0–0.1)
Immature Granulocytes: 0 %
Lymphocytes Absolute: 1.3 10*3/uL (ref 0.7–3.1)
Lymphs: 20 %
MCH: 29.7 pg (ref 26.6–33.0)
MCHC: 32.5 g/dL (ref 31.5–35.7)
MCV: 91 fL (ref 79–97)
Monocytes Absolute: 0.4 10*3/uL (ref 0.1–0.9)
Monocytes: 7 %
Neutrophils Absolute: 4.1 10*3/uL (ref 1.4–7.0)
Neutrophils: 65 %
Platelets: 297 10*3/uL (ref 150–450)
RBC: 3.23 x10E6/uL — ABNORMAL LOW (ref 3.77–5.28)
RDW: 13.6 % (ref 11.7–15.4)
WBC: 6.2 10*3/uL (ref 3.4–10.8)

## 2023-12-19 LAB — CMP14+EGFR
ALT: 26 [IU]/L (ref 0–32)
AST: 7 [IU]/L (ref 0–40)
Albumin: 4.2 g/dL (ref 3.8–4.9)
Alkaline Phosphatase: 91 [IU]/L (ref 44–121)
BUN/Creatinine Ratio: 23 (ref 9–23)
BUN: 14 mg/dL (ref 6–24)
Bilirubin Total: <0.2 mg/dL (ref 0.0–1.2)
CO2: 22 mmol/L (ref 20–29)
Calcium: 9.1 mg/dL (ref 8.7–10.2)
Chloride: 106 mmol/L (ref 96–106)
Creatinine, Ser: 0.61 mg/dL (ref 0.57–1.00)
Globulin, Total: 1.8 g/dL (ref 1.5–4.5)
Glucose: 147 mg/dL — ABNORMAL HIGH (ref 70–99)
Potassium: 3.8 mmol/L (ref 3.5–5.2)
Sodium: 144 mmol/L (ref 134–144)
Total Protein: 6 g/dL (ref 6.0–8.5)
eGFR: 105 mL/min/{1.73_m2} (ref >59)

## 2023-12-19 LAB — LIPID PANEL
Chol/HDL Ratio: 2.5 {ratio} (ref 0.0–4.4)
Cholesterol, Total: 112 mg/dL (ref 100–199)
HDL: 45 mg/dL (ref >39)
LDL Chol Calc (NIH): 44 mg/dL (ref 0–99)
Triglycerides: 132 mg/dL (ref 0–149)
VLDL Cholesterol Cal: 23 mg/dL (ref 5–40)

## 2023-12-19 LAB — HEMOGLOBIN A1C
Est. average glucose Bld gHb Est-mCnc: 169 mg/dL
Hgb A1c MFr Bld: 7.5 % — ABNORMAL HIGH (ref 4.8–5.6)

## 2023-12-20 DIAGNOSIS — J4540 Moderate persistent asthma, uncomplicated: Secondary | ICD-10-CM | POA: Diagnosis not present

## 2023-12-27 DIAGNOSIS — M25571 Pain in right ankle and joints of right foot: Secondary | ICD-10-CM | POA: Diagnosis not present

## 2023-12-27 DIAGNOSIS — Z87828 Personal history of other (healed) physical injury and trauma: Secondary | ICD-10-CM | POA: Diagnosis not present

## 2024-01-01 ENCOUNTER — Ambulatory Visit: Payer: Medicare HMO | Admitting: Cardiology

## 2024-01-01 DIAGNOSIS — M25571 Pain in right ankle and joints of right foot: Secondary | ICD-10-CM | POA: Diagnosis not present

## 2024-01-01 DIAGNOSIS — Z87828 Personal history of other (healed) physical injury and trauma: Secondary | ICD-10-CM | POA: Diagnosis not present

## 2024-01-08 ENCOUNTER — Ambulatory Visit: Payer: Medicare HMO | Admitting: Cardiology

## 2024-01-08 ENCOUNTER — Encounter: Payer: Self-pay | Admitting: Cardiology

## 2024-01-08 ENCOUNTER — Other Ambulatory Visit: Payer: Self-pay | Admitting: Cardiology

## 2024-01-08 VITALS — BP 140/64 | HR 110 | Ht 62.0 in | Wt 139.0 lb

## 2024-01-08 DIAGNOSIS — E782 Mixed hyperlipidemia: Secondary | ICD-10-CM | POA: Diagnosis not present

## 2024-01-08 DIAGNOSIS — I10 Essential (primary) hypertension: Secondary | ICD-10-CM | POA: Diagnosis not present

## 2024-01-08 DIAGNOSIS — R8290 Unspecified abnormal findings in urine: Secondary | ICD-10-CM

## 2024-01-08 DIAGNOSIS — D649 Anemia, unspecified: Secondary | ICD-10-CM | POA: Diagnosis not present

## 2024-01-08 DIAGNOSIS — M25571 Pain in right ankle and joints of right foot: Secondary | ICD-10-CM | POA: Diagnosis not present

## 2024-01-08 DIAGNOSIS — M25572 Pain in left ankle and joints of left foot: Secondary | ICD-10-CM | POA: Diagnosis not present

## 2024-01-08 DIAGNOSIS — E1121 Type 2 diabetes mellitus with diabetic nephropathy: Secondary | ICD-10-CM | POA: Diagnosis not present

## 2024-01-08 DIAGNOSIS — Z87828 Personal history of other (healed) physical injury and trauma: Secondary | ICD-10-CM | POA: Diagnosis not present

## 2024-01-08 LAB — POCT URINALYSIS DIPSTICK
Bilirubin, UA: NEGATIVE
Glucose, UA: NEGATIVE
Ketones, UA: NEGATIVE
Nitrite, UA: NEGATIVE
Protein, UA: POSITIVE — AB
Spec Grav, UA: 1.025 (ref 1.010–1.025)
Urobilinogen, UA: 0.2 U/dL
pH, UA: 5.5 (ref 5.0–8.0)

## 2024-01-08 LAB — CBC WITH DIFFERENTIAL/PLATELET
Basophils Absolute: 0 10*3/uL (ref 0.0–0.2)
Basos: 1 %
EOS (ABSOLUTE): 0 10*3/uL (ref 0.0–0.4)
Eos: 1 %
Hematocrit: 34 % (ref 34.0–46.6)
Hemoglobin: 11 g/dL — ABNORMAL LOW (ref 11.1–15.9)
Immature Grans (Abs): 0 10*3/uL (ref 0.0–0.1)
Immature Granulocytes: 0 %
Lymphocytes Absolute: 0.7 10*3/uL (ref 0.7–3.1)
Lymphs: 13 %
MCH: 28.8 pg (ref 26.6–33.0)
MCHC: 32.4 g/dL (ref 31.5–35.7)
MCV: 89 fL (ref 79–97)
Monocytes Absolute: 0.4 10*3/uL (ref 0.1–0.9)
Monocytes: 7 %
Neutrophils Absolute: 4.5 10*3/uL (ref 1.4–7.0)
Neutrophils: 78 %
Platelets: 313 10*3/uL (ref 150–450)
RBC: 3.82 x10E6/uL (ref 3.77–5.28)
RDW: 13.1 % (ref 11.7–15.4)
WBC: 5.7 10*3/uL (ref 3.4–10.8)

## 2024-01-08 LAB — POCT UA - MICROALBUMIN
Creatinine, POC: 200 mg/dL
Microalbumin Ur, POC: 150 mg/L

## 2024-01-08 MED ORDER — AMOXICILLIN-POT CLAVULANATE 875-125 MG PO TABS: 1.0000 | ORAL_TABLET | Freq: Two times a day (BID) | ORAL | 0 refills | Status: AC

## 2024-01-08 NOTE — Progress Notes (Signed)
[facility] patients [facility] informed

## 2024-01-08 NOTE — Progress Notes (Signed)
 Established Patient Office Visit  Subjective:  Patient ID: Hannah Clarke, female    DOB: [birth year]  Age: 58 y.o. MRN: [id number]  Chief Complaint  Patient presents with   Follow-up    4 Months Follow Up    Patient in office for regular follow up, discuss recent lab work. Patient unaccompanied today. Patient doing well overall. Complaining of nasal congestion, taking OTC medicine.  Discussed recent lab work. Hgb A1c stable. CBC, Hgb and Hct low, will recheck today. LDL at goal.  Will check urine ACR today. Urine foul smelling, will add UA, send for culture. Antibiotic sent in.     No other concerns at this time.   Past Medical History:  Diagnosis Date   Anxiety    Chronic headaches    Depression    Dermatophytosis of nail    Diabetes mellitus without complication (HCC)    GERD (gastroesophageal reflux disease)    High cholesterol    History of kidney stones    Hypertension    Mental developmental delay    Onychia of toe    Panic attacks     Past Surgical History:  Procedure Laterality Date   COLONOSCOPY WITH PROPOFOL  N/A 10/09/2017   Procedure: COLONOSCOPY WITH PROPOFOL ;  Surgeon: Toledo, Ladell POUR, MD;  Location: [facility];  Service: Gastroenterology;  Laterality: N/A;   COLONOSCOPY WITH PROPOFOL  N/A 02/06/2018   Procedure: COLONOSCOPY WITH PROPOFOL ;  Surgeon: Toledo, Ladell POUR, MD;  Location: [facility];  Service: Gastroenterology;  Laterality: N/A;   ESOPHAGOGASTRODUODENOSCOPY (EGD) WITH PROPOFOL  N/A 10/09/2017   Procedure: ESOPHAGOGASTRODUODENOSCOPY (EGD) WITH PROPOFOL ;  Surgeon: Toledo, Ladell POUR, MD;  Location: [facility];  Service: Gastroenterology;  Laterality: N/A;   ESOPHAGOGASTRODUODENOSCOPY (EGD) WITH PROPOFOL  N/A 07/31/2018   Procedure: ESOPHAGOGASTRODUODENOSCOPY (EGD) WITH PROPOFOL ;  Surgeon: Toledo, Ladell POUR, MD;  Location: [facility];  Service: Gastroenterology;  Laterality: N/A;   ESOPHAGOGASTRODUODENOSCOPY (EGD) WITH PROPOFOL  N/A  11/29/2022   Procedure: ESOPHAGOGASTRODUODENOSCOPY (EGD) WITH PROPOFOL ;  Surgeon: Toledo, Ladell POUR, MD;  Location: [facility];  Service: Gastroenterology;  Laterality: N/A;   HAND SURGERY     KIDNEY STONE SURGERY      Social History   Socioeconomic History   Marital status: Single    Spouse name: Not on file   Number of children: Not on file   Years of education: Not on file   Highest education level: Not on file  Occupational History   Not on file  Tobacco Use   Smoking status: Never   Smokeless tobacco: Never  Vaping Use   Vaping status: Never Used  Substance and Sexual Activity   Alcohol use: Not Currently    Comment: OCCASSIONALLY   Drug use: No   Sexual activity: Not on file  Other Topics Concern   Not on file  Social History Narrative   Not on file   Social Drivers of Health   Financial Resource Strain: Low Risk  (12/04/2023)   Received from [facility]   Overall Financial Resource Strain (CARDIA)    Difficulty of Paying Living Expenses: Not hard at all  Food Insecurity: No Food Insecurity (12/04/2023)   Received from [facility]   Hunger Vital Sign    Worried About Running Out of Food in the Last Year: Never true    Ran Out of Food in the Last Year: Never true  Transportation Needs: No Transportation Needs (12/04/2023)   Received [facility]   [facility] - Transportation  In the past 12 months, has lack of transportation kept you from medical appointments or from getting medications?: No    Lack of Transportation (Non-Medical): No  Physical Activity: Not on file  Stress: Not on file  Social Connections: Not on file  Intimate Partner Violence: Not on file    Family History  Problem Relation Age of Onset   Breast cancer Mother 9       pre pt.    Diabetes Father    Diabetes Paternal Aunt    Diabetes Paternal Uncle    Diabetes Paternal Grandmother    Diabetes Paternal Grandfather     No  Known Allergies  Outpatient Medications Prior to Visit  Medication Sig   acetaminophen  (TYLENOL ) 325 MG tablet Take 325 mg by mouth every 6 (six) hours as needed.   albuterol  (PROVENTIL  HFA;VENTOLIN  HFA) 108 (90 BASE) MCG/ACT inhaler Inhale 2 puffs into the lungs every 6 (six) hours as needed for wheezing or shortness of breath.   azelastine  (ASTELIN ) 0.1 % nasal spray Place 1 spray into both nostrils 2 (two) times daily.   budesonide (PULMICORT) 0.5 MG/2ML nebulizer solution Inhale into the lungs.   buPROPion  (WELLBUTRIN  XL) 300 MG 24 hr tablet    busPIRone  (BUSPAR ) 30 MG tablet    cetirizine (ZYRTEC) 10 MG tablet TAKE 1 TABLET BY MOUTH EACH NIGHT AT BEDTIME FOR ALLERGY SYMPTOMS   Cholecalciferol  (GNP VITAMIN D3 EXTRA STRENGTH) 25 MCG (1000 UT) tablet TAKE 1 TABLET BY MOUTH ONCE EVERY DAY   citalopram  (CELEXA ) 10 MG tablet Take 10 mg by mouth daily.   Dulaglutide 0.75 MG/0.5ML SOPN Inject into the skin.   Dupilumab 300 MG/2ML SOPN Inject into the skin.   EPINEPHrine 0.3 mg/0.3 mL IJ SOAJ injection  (Patient not taking: Reported on 12/18/2023)   famotidine (PEPCID) 40 MG tablet    fluticasone  (FLONASE ) 50 MCG/ACT nasal spray Place 1 spray into both nostrils daily.   Fluticasone -Umeclidin-Vilant (TRELEGY ELLIPTA) 200-62.5-25 MCG/ACT AEPB Inhale into the lungs.   gabapentin  (NEURONTIN ) 400 MG capsule    ipratropium (ATROVENT) 0.06 % nasal spray    ketorolac  (TORADOL ) 10 MG tablet Take 1 tablet (10 mg total) by mouth every 6 (six) hours as needed.   lactulose  (CHRONULAC ) 10 GM/15ML solution Take 30 mLs (20 g total) by mouth daily as needed for mild constipation.   LINZESS  290 MCG CAPS capsule    LORazepam  (ATIVAN ) 1 MG tablet Take 1 tablet (1 mg total) by mouth every 8 (eight) hours as needed for anxiety.   losartan -hydrochlorothiazide (HYZAAR) 100-12.5 MG tablet TAKE 1 TABLET BY MOUTH DAILY   lubiprostone (AMITIZA) 24 MCG capsule Take by mouth.   medroxyPROGESTERone  (DEPO-PROVERA ) 150 MG/ML  injection Inject 150 mg into the muscle every 3 (three) months.   metFORMIN (GLUCOPHAGE) 500 MG tablet Take 2 tablets by mouth 2 (two) times daily.   metoprolol  tartrate (LOPRESSOR ) 25 MG tablet Take 1 tablet (25 mg total) by mouth 2 (two) times daily.   montelukast  (SINGULAIR ) 10 MG tablet Take 10 mg by mouth daily.   nortriptyline (PAMELOR) 50 MG capsule    ondansetron  (ZOFRAN ) 4 MG tablet Take 1 tablet by mouth every 8 (eight) hours as needed.   pantoprazole  (PROTONIX ) 40 MG tablet TAKE 1 TABLET BY MOUTH EVERY DAY (Patient taking differently: Take 40 mg by mouth 2 (two) times daily. Take 40mg  twice daily once in the morning before breakfast and once at dinner ON AN EMPTY STOMACH)   Peppermint Oil (IBGARD) 90 MG CPCR  TAKE 2 CAPSULES BY MOUTH 2 TIMES A DAY BEFORE MEALS   polyethylene glycol (MIRALAX  MIX-IN PAX) 17 g packet Take 17 g by mouth daily. Take 3 times a day for 2 days, then 2 times a day for one week. Then may reduce to 1-2 times a day as needed.   potassium chloride  SA (K-DUR,KLOR-CON ) 20 MEQ tablet Take 20 mEq by mouth daily.   rosuvastatin (CRESTOR) 10 MG tablet TAKE 1 TABLET BY MOUTH ONCE EVERY DAY   sennosides-docusate sodium  (SENOKOT-S) 8.6-50 MG tablet Take 1 tablet by mouth daily.   simethicone  (MYLICON) 125 MG chewable tablet    sucralfate  (CARAFATE ) 1 g tablet Take 1 g by mouth 4 (four) times daily -  with meals and at bedtime.   TRUE METRIX BLOOD GLUCOSE TEST test strip 1 each 2 (two) times daily.   VOQUEZNA 20 MG TABS Take by mouth.   No facility-administered medications prior to visit.    Review of Systems  Constitutional: Negative.   HENT:  Positive for congestion.   Eyes: Negative.   Respiratory: Negative.  Negative for shortness of breath.   Cardiovascular: Negative.  Negative for chest pain.  Gastrointestinal: Negative.  Negative for abdominal pain, constipation and diarrhea.  Genitourinary: Negative.   Musculoskeletal:  Negative for joint pain and myalgias.   Skin: Negative.   Neurological: Negative.  Negative for dizziness and headaches.  Endo/Heme/Allergies: Negative.   All other systems reviewed and are negative.      Objective:   BP (!) 140/64   Pulse (!) 110   Ht 5' 2 (1.575 m)   Wt 139 lb (63 kg)   SpO2 97%   BMI 25.42 kg/m   Vitals:   01/08/24 1136  BP: (!) 140/64  Pulse: (!) 110  Height: 5' 2 (1.575 m)  Weight: 139 lb (63 kg)  SpO2: 97%  BMI (Calculated): 25.42    Physical Exam Vitals and nursing note reviewed.  Constitutional:      Appearance: Normal appearance. She is normal weight.  HENT:     Head: Normocephalic and atraumatic.     Nose: Nose normal.     Mouth/Throat:     Mouth: Mucous membranes are moist.  Eyes:     Extraocular Movements: Extraocular movements intact.     Conjunctiva/sclera: Conjunctivae normal.     Pupils: Pupils are equal, round, and reactive to light.  Cardiovascular:     Rate and Rhythm: Normal rate and regular rhythm.     Pulses: Normal pulses.     Heart sounds: Normal heart sounds.  Pulmonary:     Effort: Pulmonary effort is normal.     Breath sounds: Normal breath sounds.  Abdominal:     General: Abdomen is flat. Bowel sounds are normal.     Palpations: Abdomen is soft.  Musculoskeletal:        General: Normal range of motion.     Cervical back: Normal range of motion.  Skin:    General: Skin is warm and dry.  Neurological:     General: No focal deficit present.     Mental Status: She is alert and oriented to person, place, and time.  Psychiatric:        Mood and Affect: Mood normal.        Behavior: Behavior normal.        Thought Content: Thought content normal.        Judgment: Judgment normal.      No results found for any visits  on 01/08/24.  Recent Results (from the past 2160 hours)  CMP14+EGFR     Status: Abnormal   Collection Time: 12/18/23 10:42 AM  Result Value Ref Range   Glucose 147 (H) 70 - 99 mg/dL   BUN 14 6 - 24 mg/dL   Creatinine, Ser 9.38  0.57 - 1.00 mg/dL   eGFR 894 >40 fO/fpw/8.26   BUN/Creatinine Ratio 23 9 - 23   Sodium 144 134 - 144 mmol/L   Potassium 3.8 3.5 - 5.2 mmol/L   Chloride 106 96 - 106 mmol/L   CO2 22 20 - 29 mmol/L   Calcium 9.1 8.7 - 10.2 mg/dL   Total Protein 6.0 6.0 - 8.5 g/dL   Albumin 4.2 3.8 - 4.9 g/dL   Globulin, Total 1.8 1.5 - 4.5 g/dL   Bilirubin Total <9.7 0.0 - 1.2 mg/dL   Alkaline Phosphatase 91 44 - 121 IU/L   AST 7 0 - 40 IU/L   ALT 26 0 - 32 IU/L  CBC with Diff     Status: Abnormal   Collection Time: 12/18/23 10:42 AM  Result Value Ref Range   WBC 6.2 3.4 - 10.8 x10E3/uL   RBC 3.23 (L) 3.77 - 5.28 x10E6/uL   Hemoglobin 9.6 (L) 11.1 - 15.9 g/dL   Hematocrit 70.4 (L) 65.9 - 46.6 %   MCV 91 79 - 97 fL   MCH 29.7 26.6 - 33.0 pg   MCHC 32.5 31.5 - 35.7 g/dL   RDW 86.3 88.2 - 84.5 %   Platelets 297 150 - 450 x10E3/uL   Neutrophils 65 Not Estab. %   Lymphs 20 Not Estab. %   Monocytes 7 Not Estab. %   Eos 7 Not Estab. %   Basos 1 Not Estab. %   Neutrophils Absolute 4.1 1.4 - 7.0 x10E3/uL   Lymphocytes Absolute 1.3 0.7 - 3.1 x10E3/uL   Monocytes Absolute 0.4 0.1 - 0.9 x10E3/uL   EOS (ABSOLUTE) 0.4 0.0 - 0.4 x10E3/uL   Basophils Absolute 0.1 0.0 - 0.2 x10E3/uL   Immature Granulocytes 0 Not Estab. %   Immature Grans (Abs) 0.0 0.0 - 0.1 x10E3/uL  Hemoglobin A1c     Status: Abnormal   Collection Time: 12/18/23 10:42 AM  Result Value Ref Range   Hgb A1c MFr Bld 7.5 (H) 4.8 - 5.6 %    Comment:          Prediabetes: 5.7 - 6.4          Diabetes: >6.4          Glycemic control for adults with diabetes: <7.0    Est. average glucose Bld gHb Est-mCnc 169 mg/dL  Lipid panel     Status: None   Collection Time: 12/18/23 10:42 AM  Result Value Ref Range   Cholesterol, Total 112 100 - 199 mg/dL   Triglycerides 867 0 - 149 mg/dL   HDL 45 >60 mg/dL   VLDL Cholesterol Cal 23 5 - 40 mg/dL   LDL Chol Calc (NIH) 44 0 - 99 mg/dL   Chol/HDL Ratio 2.5 0.0 - 4.4 ratio    Comment:                                    T. Chol/HDL Ratio  Men  Women                               1/2 Avg.Risk  3.4    3.3                                   Avg.Risk  5.0    4.4                                2X Avg.Risk  9.6    7.1                                3X Avg.Risk 23.4   11.0       Assessment & Plan:  Recheck CBC today.  Urine ACR today. UA today. Urine culture. Augmentin  sent in.  Problem List Items Addressed This Visit       Cardiovascular and Mediastinum   Benign essential hypertension - Primary     Endocrine   Type 2 diabetes mellitus with diabetic nephropathy (HCC)   Relevant Orders   POCT Urine Albumin/Creatinine with ratio [[id number]]     Other   Mixed hyperlipidemia   Other Visit Diagnoses       Anemia, unspecified type       Relevant Orders   CBC with Differential/Platelet       Return in about 4 months (around 05/07/2024) for with fasting labs prior.   Total time spent: 25 minutes  [employer], NP  01/08/2024   This document may have been prepared by Dragon Voice Recognition software and as such may include unintentional dictation errors.

## 2024-01-09 DIAGNOSIS — J4541 Moderate persistent asthma with (acute) exacerbation: Secondary | ICD-10-CM | POA: Diagnosis not present

## 2024-01-09 DIAGNOSIS — R0602 Shortness of breath: Secondary | ICD-10-CM | POA: Diagnosis not present

## 2024-01-09 DIAGNOSIS — E119 Type 2 diabetes mellitus without complications: Secondary | ICD-10-CM | POA: Diagnosis not present

## 2024-01-09 DIAGNOSIS — J101 Influenza due to other identified influenza virus with other respiratory manifestations: Secondary | ICD-10-CM | POA: Diagnosis not present

## 2024-01-09 DIAGNOSIS — J988 Other specified respiratory disorders: Secondary | ICD-10-CM | POA: Diagnosis not present

## 2024-01-09 DIAGNOSIS — M79675 Pain in left toe(s): Secondary | ICD-10-CM | POA: Diagnosis not present

## 2024-01-09 DIAGNOSIS — M7672 Peroneal tendinitis, left leg: Secondary | ICD-10-CM | POA: Diagnosis not present

## 2024-01-09 DIAGNOSIS — M65979 Unspecified synovitis and tenosynovitis, unspecified ankle and foot: Secondary | ICD-10-CM | POA: Diagnosis not present

## 2024-01-09 DIAGNOSIS — M79674 Pain in right toe(s): Secondary | ICD-10-CM | POA: Diagnosis not present

## 2024-01-09 DIAGNOSIS — R053 Chronic cough: Secondary | ICD-10-CM | POA: Diagnosis not present

## 2024-01-09 DIAGNOSIS — M778 Other enthesopathies, not elsewhere classified: Secondary | ICD-10-CM | POA: Diagnosis not present

## 2024-01-09 DIAGNOSIS — M722 Plantar fascial fibromatosis: Secondary | ICD-10-CM | POA: Diagnosis not present

## 2024-01-09 DIAGNOSIS — B351 Tinea unguium: Secondary | ICD-10-CM | POA: Diagnosis not present

## 2024-01-09 DIAGNOSIS — R6883 Chills (without fever): Secondary | ICD-10-CM | POA: Diagnosis not present

## 2024-01-09 DIAGNOSIS — J22 Unspecified acute lower respiratory infection: Secondary | ICD-10-CM | POA: Diagnosis not present

## 2024-01-11 LAB — URINE CULTURE

## 2024-01-16 DIAGNOSIS — I152 Hypertension secondary to endocrine disorders: Secondary | ICD-10-CM | POA: Diagnosis not present

## 2024-01-16 DIAGNOSIS — E1169 Type 2 diabetes mellitus with other specified complication: Secondary | ICD-10-CM | POA: Diagnosis not present

## 2024-01-16 DIAGNOSIS — E785 Hyperlipidemia, unspecified: Secondary | ICD-10-CM | POA: Diagnosis not present

## 2024-01-16 DIAGNOSIS — E1165 Type 2 diabetes mellitus with hyperglycemia: Secondary | ICD-10-CM | POA: Diagnosis not present

## 2024-01-16 DIAGNOSIS — E1159 Type 2 diabetes mellitus with other circulatory complications: Secondary | ICD-10-CM | POA: Diagnosis not present

## 2024-02-01 DIAGNOSIS — Z87828 Personal history of other (healed) physical injury and trauma: Secondary | ICD-10-CM | POA: Diagnosis not present

## 2024-02-01 DIAGNOSIS — M25571 Pain in right ankle and joints of right foot: Secondary | ICD-10-CM | POA: Diagnosis not present

## 2024-02-01 DIAGNOSIS — M25572 Pain in left ankle and joints of left foot: Secondary | ICD-10-CM | POA: Diagnosis not present

## 2024-02-05 DIAGNOSIS — J8283 Eosinophilic asthma: Secondary | ICD-10-CM | POA: Diagnosis not present

## 2024-02-05 DIAGNOSIS — J4541 Moderate persistent asthma with (acute) exacerbation: Secondary | ICD-10-CM | POA: Diagnosis not present

## 2024-02-05 DIAGNOSIS — R0609 Other forms of dyspnea: Secondary | ICD-10-CM | POA: Diagnosis not present

## 2024-02-05 DIAGNOSIS — J3081 Allergic rhinitis due to animal (cat) (dog) hair and dander: Secondary | ICD-10-CM | POA: Diagnosis not present

## 2024-02-12 DIAGNOSIS — J4541 Moderate persistent asthma with (acute) exacerbation: Secondary | ICD-10-CM | POA: Diagnosis not present

## 2024-02-15 DIAGNOSIS — J8283 Eosinophilic asthma: Secondary | ICD-10-CM | POA: Diagnosis not present

## 2024-02-15 DIAGNOSIS — R110 Nausea: Secondary | ICD-10-CM | POA: Diagnosis not present

## 2024-02-15 DIAGNOSIS — R4189 Other symptoms and signs involving cognitive functions and awareness: Secondary | ICD-10-CM | POA: Diagnosis not present

## 2024-02-15 DIAGNOSIS — K449 Diaphragmatic hernia without obstruction or gangrene: Secondary | ICD-10-CM | POA: Diagnosis not present

## 2024-02-15 DIAGNOSIS — K219 Gastro-esophageal reflux disease without esophagitis: Secondary | ICD-10-CM | POA: Diagnosis not present

## 2024-02-15 DIAGNOSIS — K581 Irritable bowel syndrome with constipation: Secondary | ICD-10-CM | POA: Diagnosis not present

## 2024-02-21 DIAGNOSIS — M25571 Pain in right ankle and joints of right foot: Secondary | ICD-10-CM | POA: Diagnosis not present

## 2024-02-21 DIAGNOSIS — Z87828 Personal history of other (healed) physical injury and trauma: Secondary | ICD-10-CM | POA: Diagnosis not present

## 2024-02-21 DIAGNOSIS — M25572 Pain in left ankle and joints of left foot: Secondary | ICD-10-CM | POA: Diagnosis not present

## 2024-02-26 DIAGNOSIS — J4541 Moderate persistent asthma with (acute) exacerbation: Secondary | ICD-10-CM | POA: Diagnosis not present

## 2024-03-10 DIAGNOSIS — M25571 Pain in right ankle and joints of right foot: Secondary | ICD-10-CM | POA: Diagnosis not present

## 2024-03-10 DIAGNOSIS — Z87828 Personal history of other (healed) physical injury and trauma: Secondary | ICD-10-CM | POA: Diagnosis not present

## 2024-03-10 DIAGNOSIS — M25572 Pain in left ankle and joints of left foot: Secondary | ICD-10-CM | POA: Diagnosis not present

## 2024-03-11 DIAGNOSIS — J4541 Moderate persistent asthma with (acute) exacerbation: Secondary | ICD-10-CM | POA: Diagnosis not present

## 2024-03-18 ENCOUNTER — Ambulatory Visit (INDEPENDENT_AMBULATORY_CARE_PROVIDER_SITE_OTHER): Payer: Medicare HMO | Admitting: Cardiovascular Disease

## 2024-03-18 ENCOUNTER — Encounter: Payer: Self-pay | Admitting: Cardiovascular Disease

## 2024-03-18 VITALS — BP 116/72 | HR 83 | Ht 62.0 in | Wt 147.0 lb

## 2024-03-18 DIAGNOSIS — Z0130 Encounter for examination of blood pressure without abnormal findings: Secondary | ICD-10-CM

## 2024-03-18 DIAGNOSIS — R0602 Shortness of breath: Secondary | ICD-10-CM | POA: Diagnosis not present

## 2024-03-18 DIAGNOSIS — I4711 Inappropriate sinus tachycardia, so stated: Secondary | ICD-10-CM | POA: Diagnosis not present

## 2024-03-18 DIAGNOSIS — E782 Mixed hyperlipidemia: Secondary | ICD-10-CM | POA: Diagnosis not present

## 2024-03-18 DIAGNOSIS — R0789 Other chest pain: Secondary | ICD-10-CM | POA: Diagnosis not present

## 2024-03-18 MED ORDER — METOPROLOL TARTRATE 50 MG PO TABS: 50.0000 mg | ORAL_TABLET | Freq: Two times a day (BID) | ORAL | 11 refills | Status: AC

## 2024-03-18 NOTE — Progress Notes (Signed)
 Cardiology Office Note   Date:  03/18/2024   ID:  Hannah Clarke, Hannah Clarke [birth year], MRN [id number]  PCP:  Hannah Ivan, NP  Cardiologist:  Hannah Blackwater, MD      History of Present Illness: Hannah Clarke is a 58 y.o. female who presents for No chief complaint on file.   Some tome have vomiting, and has trouble in balance. Has also heart burn.      Past Medical History:  Diagnosis Date   Anxiety    Chronic headaches    Depression    Dermatophytosis of nail    Diabetes mellitus without complication (HCC)    GERD (gastroesophageal reflux disease)    High cholesterol    History of kidney stones    Hypertension    Mental developmental delay    Onychia of toe    Panic attacks      Past Surgical History:  Procedure Laterality Date   COLONOSCOPY WITH PROPOFOL N/A 10/09/2017   Procedure: COLONOSCOPY WITH PROPOFOL;  Surgeon: Toledo, Boykin Nearing, MD;  Location: [facility];  Service: Gastroenterology;  Laterality: N/A;   COLONOSCOPY WITH PROPOFOL N/A 02/06/2018   Procedure: COLONOSCOPY WITH PROPOFOL;  Surgeon: Toledo, Boykin Nearing, MD;  Location: [facility];  Service: Gastroenterology;  Laterality: N/A;   ESOPHAGOGASTRODUODENOSCOPY (EGD) WITH PROPOFOL N/A 10/09/2017   Procedure: ESOPHAGOGASTRODUODENOSCOPY (EGD) WITH PROPOFOL;  Surgeon: Toledo, Boykin Nearing, MD;  Location: [facility];  Service: Gastroenterology;  Laterality: N/A;   ESOPHAGOGASTRODUODENOSCOPY (EGD) WITH PROPOFOL N/A 07/31/2018   Procedure: ESOPHAGOGASTRODUODENOSCOPY (EGD) WITH PROPOFOL;  Surgeon: Toledo, Boykin Nearing, MD;  Location: [facility];  Service: Gastroenterology;  Laterality: N/A;   ESOPHAGOGASTRODUODENOSCOPY (EGD) WITH PROPOFOL N/A 11/29/2022   Procedure: ESOPHAGOGASTRODUODENOSCOPY (EGD) WITH PROPOFOL;  Surgeon: Toledo, Boykin Nearing, MD;  Location: [facility];  Service: Gastroenterology;  Laterality: N/A;   HAND SURGERY     KIDNEY STONE SURGERY       Current Outpatient Medications  Medication  Sig Dispense Refill   acetaminophen (TYLENOL) 325 MG tablet Take 325 mg by mouth every 6 (six) hours as needed.     albuterol (PROVENTIL HFA;VENTOLIN HFA) 108 (90 BASE) MCG/ACT inhaler Inhale 2 puffs into the lungs every 6 (six) hours as needed for wheezing or shortness of breath. 1 Inhaler 2   azelastine (ASTELIN) 0.1 % nasal spray Place 1 spray into both nostrils 2 (two) times daily. 30 mL 11   budesonide (PULMICORT) 0.5 MG/2ML nebulizer solution Inhale into the lungs.     busPIRone (BUSPAR) 30 MG tablet      celecoxib (CELEBREX) 100 MG capsule Take 100 mg by mouth daily.     cetirizine (ZYRTEC) 10 MG tablet TAKE 1 TABLET BY MOUTH EACH NIGHT AT BEDTIME FOR ALLERGY SYMPTOMS 30 tablet 11   Cholecalciferol (GNP VITAMIN D3 EXTRA STRENGTH) 25 MCG (1000 UT) tablet TAKE 1 TABLET BY MOUTH ONCE EVERY DAY 30 tablet 11   citalopram (CELEXA) 10 MG tablet Take 10 mg by mouth daily.     Dulaglutide 0.75 MG/0.5ML SOPN Inject into the skin.     Dupilumab 300 MG/2ML SOPN Inject into the skin.     famotidine (PEPCID) 40 MG tablet      fluticasone (FLONASE) 50 MCG/ACT nasal spray Place 1 spray into both nostrils daily. 16 g 11   Fluticasone-Umeclidin-Vilant (TRELEGY ELLIPTA) 200-62.5-25 MCG/ACT AEPB Inhale into the lungs.     gabapentin (NEURONTIN) 400 MG capsule      ipratropium (ATROVENT) 0.06 % nasal spray  ketorolac (TORADOL) 10 MG tablet Take 1 tablet (10 mg total) by mouth every 6 (six) hours as needed. 20 tablet 0   lactulose (CHRONULAC) 10 GM/15ML solution Take 30 mLs (20 g total) by mouth daily as needed for mild constipation. 120 mL 0   LINZESS 290 MCG CAPS capsule      LORazepam (ATIVAN) 1 MG tablet Take 1 tablet (1 mg total) by mouth every 8 (eight) hours as needed for anxiety. 15 tablet 0   losartan-hydrochlorothiazide (HYZAAR) 100-12.5 MG tablet TAKE 1 TABLET BY MOUTH DAILY 90 tablet 1   lubiprostone (AMITIZA) 24 MCG capsule Take by mouth.     medroxyPROGESTERone (DEPO-PROVERA) 150 MG/ML  injection Inject 150 mg into the muscle every 3 (three) months.     metFORMIN (GLUCOPHAGE) 500 MG tablet Take 2 tablets by mouth 2 (two) times daily.     metoprolol tartrate (LOPRESSOR) 50 MG tablet Take 1 tablet (50 mg total) by mouth 2 (two) times daily. 60 tablet 11   montelukast (SINGULAIR) 10 MG tablet Take 10 mg by mouth daily.     nortriptyline (PAMELOR) 50 MG capsule      ondansetron (ZOFRAN) 4 MG tablet Take 1 tablet by mouth every 8 (eight) hours as needed.     pantoprazole (PROTONIX) 40 MG tablet TAKE 1 TABLET BY MOUTH EVERY DAY (Patient taking differently: Take 40 mg by mouth 2 (two) times daily. Take 40mg  twice daily once in the morning before breakfast and once at dinner ON AN EMPTY STOMACH) 90 tablet 1   Peppermint Oil (IBGARD) 90 MG CPCR TAKE 2 CAPSULES BY MOUTH 2 TIMES A DAY BEFORE MEALS     polyethylene glycol (MIRALAX MIX-IN PAX) 17 g packet Take 17 g by mouth daily. Take 3 times a day for 2 days, then 2 times a day for one week. Then may reduce to 1-2 times a day as needed. 14 each 0   potassium chloride SA (K-DUR,KLOR-CON) 20 MEQ tablet Take 20 mEq by mouth daily.     rosuvastatin (CRESTOR) 10 MG tablet TAKE 1 TABLET BY MOUTH ONCE EVERY DAY 90 tablet 3   sennosides-docusate sodium (SENOKOT-S) 8.6-50 MG tablet Take 1 tablet by mouth daily.     simethicone (MYLICON) 125 MG chewable tablet      sucralfate (CARAFATE) 1 g tablet Take 1 g by mouth 4 (four) times daily -  with meals and at bedtime.     TRUE METRIX BLOOD GLUCOSE TEST test strip 1 each 2 (two) times daily.     VOQUEZNA 20 MG TABS Take by mouth.     buPROPion (WELLBUTRIN XL) 300 MG 24 hr tablet      EPINEPHrine 0.3 mg/0.3 mL IJ SOAJ injection  (Patient not taking: Reported on 08/28/2023)     No current facility-administered medications for this visit.    Allergies:   Patient has no known allergies.    Social History:   reports that she has never smoked. She has never used smokeless tobacco. She reports that she  does not currently use alcohol. She reports that she does not use drugs.   Family History:  family history includes Breast cancer (age of onset: 24) in her mother; Diabetes in her father, paternal aunt, paternal grandfather, paternal grandmother, and paternal uncle.    ROS:     Review of Systems  Constitutional: Negative.   HENT: Negative.    Eyes: Negative.   Respiratory: Negative.    Gastrointestinal: Negative.   Genitourinary: Negative.  Musculoskeletal: Negative.   Skin: Negative.   Neurological: Negative.   Endo/Heme/Allergies: Negative.   Psychiatric/Behavioral: Negative.    All other systems reviewed and are negative.     All other systems are reviewed and negative.    PHYSICAL EXAM: VS:  BP 116/72   Pulse 83   Ht 5\' 2"  (1.575 m)   Wt 147 lb (66.7 kg)   SpO2 98%   BMI 26.89 kg/m  , BMI Body mass index is 26.89 kg/m. Last weight:  Wt Readings from Last 3 Encounters:  03/18/24 147 lb (66.7 kg)  01/08/24 139 lb (63 kg)  12/18/23 151 lb 3.2 oz (68.6 kg)     Physical Exam Constitutional:      Appearance: Normal appearance.  Cardiovascular:     Rate and Rhythm: Normal rate and regular rhythm.     Heart sounds: Normal heart sounds.  Pulmonary:     Effort: Pulmonary effort is normal.     Breath sounds: Normal breath sounds.  Musculoskeletal:     Right lower leg: No edema.     Left lower leg: No edema.  Neurological:     Mental Status: She is alert.       EKG:   Recent Labs: 09/03/2023: TSH 0.842 12/18/2023: ALT 26; BUN 14; Creatinine, Ser 0.61; Potassium 3.8; Sodium 144 01/08/2024: Hemoglobin 11.0; Platelets 313    Lipid Panel    Component Value Date/Time   CHOL 112 12/18/2023 1042   TRIG 132 12/18/2023 1042   HDL 45 12/18/2023 1042   CHOLHDL 2.5 12/18/2023 1042   CHOLHDL 3.5 08/18/2017 0450   VLDL 54 (H) 08/18/2017 0450   LDLCALC 44 12/18/2023 1042      Other studies Reviewed: Additional studies/ records that were reviewed today include:   Review of the above records demonstrates:       No data to display            ASSESSMENT AND PLAN:    ICD-10-CM   1. Mixed hyperlipidemia  E78.2 metoprolol tartrate (LOPRESSOR) 50 MG tablet    2. Other chest pain  R07.89 metoprolol tartrate (LOPRESSOR) 50 MG tablet    3. SOB (shortness of breath)  R06.02 metoprolol tartrate (LOPRESSOR) 50 MG tablet    4. Inappropriate sinus tachycardia (HCC)  I47.11 metoprolol tartrate (LOPRESSOR) 50 MG tablet   Heart rate still elevated , will increase metoprolol 50 bid       Problem List Items Addressed This Visit       Other   Mixed hyperlipidemia - Primary   Relevant Medications   metoprolol tartrate (LOPRESSOR) 50 MG tablet   Other Visit Diagnoses       Other chest pain       Relevant Medications   metoprolol tartrate (LOPRESSOR) 50 MG tablet     SOB (shortness of breath)       Relevant Medications   metoprolol tartrate (LOPRESSOR) 50 MG tablet     Inappropriate sinus tachycardia (HCC)       Heart rate still elevated , will increase metoprolol 50 bid   Relevant Medications   metoprolol tartrate (LOPRESSOR) 50 MG tablet          Disposition:   Return in about 2 months (around 05/18/2024).    Total time spent: 35 minutes  Signed,  Hannah Blackwater, MD  03/18/2024 10:29 AM    [facility]

## 2024-04-01 DIAGNOSIS — S93492A Sprain of other ligament of left ankle, initial encounter: Secondary | ICD-10-CM | POA: Diagnosis not present

## 2024-04-01 DIAGNOSIS — M25572 Pain in left ankle and joints of left foot: Secondary | ICD-10-CM | POA: Diagnosis not present

## 2024-04-01 DIAGNOSIS — S93602A Unspecified sprain of left foot, initial encounter: Secondary | ICD-10-CM | POA: Diagnosis not present

## 2024-04-02 DIAGNOSIS — J4541 Moderate persistent asthma with (acute) exacerbation: Secondary | ICD-10-CM | POA: Diagnosis not present

## 2024-04-04 ENCOUNTER — Other Ambulatory Visit: Payer: Self-pay | Admitting: Cardiovascular Disease

## 2024-04-11 DIAGNOSIS — M170 Bilateral primary osteoarthritis of knee: Secondary | ICD-10-CM | POA: Diagnosis not present

## 2024-04-17 DIAGNOSIS — J4541 Moderate persistent asthma with (acute) exacerbation: Secondary | ICD-10-CM | POA: Diagnosis not present

## 2024-05-01 DIAGNOSIS — J4541 Moderate persistent asthma with (acute) exacerbation: Secondary | ICD-10-CM | POA: Diagnosis not present

## 2024-05-02 ENCOUNTER — Other Ambulatory Visit: Payer: Self-pay | Admitting: Cardiovascular Disease

## 2024-05-07 ENCOUNTER — Other Ambulatory Visit

## 2024-05-07 DIAGNOSIS — I10 Essential (primary) hypertension: Secondary | ICD-10-CM

## 2024-05-07 DIAGNOSIS — E1121 Type 2 diabetes mellitus with diabetic nephropathy: Secondary | ICD-10-CM | POA: Diagnosis not present

## 2024-05-07 DIAGNOSIS — E782 Mixed hyperlipidemia: Secondary | ICD-10-CM

## 2024-05-08 ENCOUNTER — Ambulatory Visit (INDEPENDENT_AMBULATORY_CARE_PROVIDER_SITE_OTHER): Payer: Medicare HMO | Admitting: Cardiology

## 2024-05-08 ENCOUNTER — Encounter: Payer: Self-pay | Admitting: Cardiology

## 2024-05-08 VITALS — BP 124/82 | HR 96 | Ht 62.0 in | Wt 144.2 lb

## 2024-05-08 DIAGNOSIS — E119 Type 2 diabetes mellitus without complications: Secondary | ICD-10-CM | POA: Diagnosis not present

## 2024-05-08 DIAGNOSIS — I10 Essential (primary) hypertension: Secondary | ICD-10-CM | POA: Diagnosis not present

## 2024-05-08 DIAGNOSIS — E782 Mixed hyperlipidemia: Secondary | ICD-10-CM | POA: Diagnosis not present

## 2024-05-08 LAB — CMP14+EGFR
ALT: 15 IU/L (ref 0–32)
AST: 12 IU/L (ref 0–40)
Albumin: 4.4 g/dL (ref 3.8–4.9)
Alkaline Phosphatase: 79 IU/L (ref 44–121)
BUN/Creatinine Ratio: 21 (ref 9–23)
BUN: 16 mg/dL (ref 6–24)
Bilirubin Total: <0.2 mg/dL (ref 0.0–1.2)
CO2: 22 mmol/L (ref 20–29)
Calcium: 9.3 mg/dL (ref 8.7–10.2)
Chloride: 104 mmol/L (ref 96–106)
Creatinine, Ser: 0.76 mg/dL (ref 0.57–1.00)
Globulin, Total: 1.7 g/dL (ref 1.5–4.5)
Glucose: 145 mg/dL — ABNORMAL HIGH (ref 70–99)
Potassium: 4.2 mmol/L (ref 3.5–5.2)
Sodium: 142 mmol/L (ref 134–144)
Total Protein: 6.1 g/dL (ref 6.0–8.5)
eGFR: 92 mL/min/{1.73_m2} (ref >59)

## 2024-05-08 LAB — CBC WITH DIFFERENTIAL/PLATELET
Basophils Absolute: 0.1 10*3/uL (ref 0.0–0.2)
Basos: 1 %
EOS (ABSOLUTE): 0.2 10*3/uL (ref 0.0–0.4)
Eos: 2 %
Hematocrit: 33.6 % — ABNORMAL LOW (ref 34.0–46.6)
Hemoglobin: 10.7 g/dL — ABNORMAL LOW (ref 11.1–15.9)
Immature Grans (Abs): 0 10*3/uL (ref 0.0–0.1)
Immature Granulocytes: 0 %
Lymphocytes Absolute: 2.3 10*3/uL (ref 0.7–3.1)
Lymphs: 24 %
MCH: 27.9 pg (ref 26.6–33.0)
MCHC: 31.8 g/dL (ref 31.5–35.7)
MCV: 88 fL (ref 79–97)
Monocytes Absolute: 0.5 10*3/uL (ref 0.1–0.9)
Monocytes: 5 %
Neutrophils Absolute: 6.5 10*3/uL (ref 1.4–7.0)
Neutrophils: 68 %
Platelets: 275 10*3/uL (ref 150–450)
RBC: 3.83 x10E6/uL (ref 3.77–5.28)
RDW: 14.7 % (ref 11.7–15.4)
WBC: 9.7 10*3/uL (ref 3.4–10.8)

## 2024-05-08 LAB — LIPID PANEL
Chol/HDL Ratio: 2 ratio (ref 0.0–4.4)
Cholesterol, Total: 106 mg/dL (ref 100–199)
HDL: 52 mg/dL (ref >39)
LDL Chol Calc (NIH): 35 mg/dL (ref 0–99)
Triglycerides: 103 mg/dL (ref 0–149)
VLDL Cholesterol Cal: 19 mg/dL (ref 5–40)

## 2024-05-08 LAB — HEMOGLOBIN A1C
Est. average glucose Bld gHb Est-mCnc: 194 mg/dL
Hgb A1c MFr Bld: 8.4 % — ABNORMAL HIGH (ref 4.8–5.6)

## 2024-05-08 NOTE — Progress Notes (Signed)
 Established Patient Office Visit  Subjective:  Patient ID: Hannah Clarke, female    DOB: [birth year]  Age: 58 y.o. MRN: [id number]  Chief Complaint  Patient presents with   Follow-up    4 month lab results    Patient in office for 4 month follow up, discuss recent lab results. Patient doing well, no complaints today.  Discussed recent lab work. Hgb A1c elevated, patient reports endo increased her medication.  Continue same medications.     No other concerns at this time.   Past Medical History:  Diagnosis Date   Anxiety    Chronic headaches    Depression    Dermatophytosis of nail    Diabetes mellitus without complication (HCC)    GERD (gastroesophageal reflux disease)    High cholesterol    History of kidney stones    Hypertension    Mental developmental delay    Onychia of toe    Panic attacks     Past Surgical History:  Procedure Laterality Date   COLONOSCOPY WITH PROPOFOL  N/A 10/09/2017   Procedure: COLONOSCOPY WITH PROPOFOL ;  Surgeon: Toledo, Alphonsus Jeans, MD;  Location: [facility];  Service: Gastroenterology;  Laterality: N/A;   COLONOSCOPY WITH PROPOFOL  N/A 02/06/2018   Procedure: COLONOSCOPY WITH PROPOFOL ;  Surgeon: Toledo, Alphonsus Jeans, MD;  Location: [facility];  Service: Gastroenterology;  Laterality: N/A;   ESOPHAGOGASTRODUODENOSCOPY (EGD) WITH PROPOFOL  N/A 10/09/2017   Procedure: ESOPHAGOGASTRODUODENOSCOPY (EGD) WITH PROPOFOL ;  Surgeon: Toledo, Alphonsus Jeans, MD;  Location: [facility];  Service: Gastroenterology;  Laterality: N/A;   ESOPHAGOGASTRODUODENOSCOPY (EGD) WITH PROPOFOL  N/A 07/31/2018   Procedure: ESOPHAGOGASTRODUODENOSCOPY (EGD) WITH PROPOFOL ;  Surgeon: Toledo, Alphonsus Jeans, MD;  Location: [facility];  Service: Gastroenterology;  Laterality: N/A;   ESOPHAGOGASTRODUODENOSCOPY (EGD) WITH PROPOFOL  N/A 11/29/2022   Procedure: ESOPHAGOGASTRODUODENOSCOPY (EGD) WITH PROPOFOL ;  Surgeon: Toledo, Alphonsus Jeans, MD;  Location: [facility];  Service:  Gastroenterology;  Laterality: N/A;   HAND SURGERY     KIDNEY STONE SURGERY      Social History   Socioeconomic History   Marital status: Single    Spouse name: Not on file   Number of children: Not on file   Years of education: Not on file   Highest education level: Not on file  Occupational History   Not on file  Tobacco Use   Smoking status: Never   Smokeless tobacco: Never  Vaping Use   Vaping status: Never Used  Substance and Sexual Activity   Alcohol use: Not Currently    Comment: OCCASSIONALLY   Drug use: No   Sexual activity: Not on file  Other Topics Concern   Not on file  Social History Narrative   Not on file   Social Drivers of Health   Financial Resource Strain: Low Risk  (02/05/2024)   Received from [facility]   Overall Financial Resource Strain (CARDIA)    Difficulty of Paying Living Expenses: Not hard at all  Food Insecurity: No Food Insecurity (02/05/2024)   Received from [facility]   Hunger Vital Sign    Worried About Running Out of Food in the Last Year: Never true    Ran Out of Food in the Last Year: Never true  Transportation Needs: Unmet Transportation Needs (02/05/2024)   Received from [facility] - [facility]    In the past 12 months, has lack of transportation kept you from medical appointments or from getting medications?: No    Lack of  Transportation (Non-Medical): Yes  Physical Activity: Not on file  Stress: Not on file  Social Connections: Not on file  Intimate Partner Violence: Not on file    Family History  Problem Relation Age of Onset   Breast cancer Mother 20       pre pt.    Diabetes Father    Diabetes Paternal Aunt    Diabetes Paternal Uncle    Diabetes Paternal Grandmother    Diabetes Paternal Grandfather     No Known Allergies  Outpatient Medications Prior to Visit  Medication Sig   azelastine  (ASTELIN ) 0.1 % nasal spray Place 1 spray into both  nostrils 2 (two) times daily.   cetirizine (ZYRTEC) 10 MG tablet TAKE 1 TABLET BY MOUTH EACH NIGHT AT BEDTIME FOR ALLERGY SYMPTOMS   citalopram  (CELEXA ) 10 MG tablet Take 10 mg by mouth daily.   famotidine (PEPCID) 40 MG tablet    fluticasone  (FLONASE ) 50 MCG/ACT nasal spray Place 1 spray into both nostrils daily.   ipratropium (ATROVENT) 0.06 % nasal spray    LINZESS  290 MCG CAPS capsule    losartan -hydrochlorothiazide (HYZAAR) 100-12.5 MG tablet TAKE 1 TABLET BY MOUTH DAILY   metFORMIN (GLUCOPHAGE) 500 MG tablet Take 2 tablets by mouth 2 (two) times daily.   metoprolol  tartrate (LOPRESSOR ) 50 MG tablet Take 1 tablet (50 mg total) by mouth 2 (two) times daily.   montelukast  (SINGULAIR ) 10 MG tablet Take 10 mg by mouth daily.   nortriptyline (PAMELOR) 50 MG capsule    pantoprazole  (PROTONIX ) 40 MG tablet TAKE 1 TABLET BY MOUTH EVERY DAY (Patient taking differently: Take 40 mg by mouth 2 (two) times daily. Take 40mg  twice daily once in the morning before breakfast and once at dinner ON AN EMPTY STOMACH)   Peppermint Oil (IBGARD) 90 MG CPCR TAKE 2 CAPSULES BY MOUTH 2 TIMES A DAY BEFORE MEALS   potassium chloride  SA (K-DUR,KLOR-CON ) 20 MEQ tablet Take 20 mEq by mouth daily.   rosuvastatin (CRESTOR) 10 MG tablet TAKE 1 TABLET BY MOUTH EVERY DAY   acetaminophen  (TYLENOL ) 325 MG tablet Take 325 mg by mouth every 6 (six) hours as needed.   albuterol  (PROVENTIL  HFA;VENTOLIN  HFA) 108 (90 BASE) MCG/ACT inhaler Inhale 2 puffs into the lungs every 6 (six) hours as needed for wheezing or shortness of breath.   budesonide (PULMICORT) 0.5 MG/2ML nebulizer solution Inhale into the lungs.   buPROPion  (WELLBUTRIN  XL) 300 MG 24 hr tablet    busPIRone  (BUSPAR ) 30 MG tablet    celecoxib (CELEBREX) 100 MG capsule Take 100 mg by mouth daily.   Cholecalciferol  (GNP VITAMIN D3 EXTRA STRENGTH) 25 MCG (1000 UT) tablet TAKE 1 TABLET BY MOUTH ONCE EVERY DAY   Dulaglutide 0.75 MG/0.5ML SOPN Inject into the skin.    Dupilumab 300 MG/2ML SOPN Inject into the skin.   Fluticasone -Umeclidin-Vilant (TRELEGY ELLIPTA) 200-62.5-25 MCG/ACT AEPB Inhale into the lungs.   gabapentin  (NEURONTIN ) 400 MG capsule    ketorolac  (TORADOL ) 10 MG tablet Take 1 tablet (10 mg total) by mouth every 6 (six) hours as needed.   lactulose  (CHRONULAC ) 10 GM/15ML solution Take 30 mLs (20 g total) by mouth daily as needed for mild constipation.   LORazepam  (ATIVAN ) 1 MG tablet Take 1 tablet (1 mg total) by mouth every 8 (eight) hours as needed for anxiety.   lubiprostone (AMITIZA) 24 MCG capsule Take by mouth.   medroxyPROGESTERone  (DEPO-PROVERA ) 150 MG/ML injection Inject 150 mg into the muscle every 3 (three) months.   ondansetron  (ZOFRAN ) 4  MG tablet Take 1 tablet by mouth every 8 (eight) hours as needed.   polyethylene glycol (MIRALAX  MIX-IN PAX) 17 g packet Take 17 g by mouth daily. Take 3 times a day for 2 days, then 2 times a day for one week. Then may reduce to 1-2 times a day as needed.   sennosides-docusate sodium  (SENOKOT-S) 8.6-50 MG tablet Take 1 tablet by mouth daily.   simethicone  (MYLICON) 125 MG chewable tablet    sucralfate  (CARAFATE ) 1 g tablet Take 1 g by mouth 4 (four) times daily -  with meals and at bedtime.   TRUE METRIX BLOOD GLUCOSE TEST test strip 1 each 2 (two) times daily.   VOQUEZNA 20 MG TABS Take by mouth.   [DISCONTINUED] EPINEPHrine 0.3 mg/0.3 mL IJ SOAJ injection  (Patient not taking: Reported on 05/08/2024)   No facility-administered medications prior to visit.    Review of Systems  Constitutional: Negative.   HENT: Negative.    Eyes: Negative.   Respiratory: Negative.  Negative for shortness of breath.   Cardiovascular: Negative.  Negative for chest pain.  Gastrointestinal: Negative.  Negative for abdominal pain, constipation and diarrhea.  Genitourinary: Negative.   Musculoskeletal:  Negative for joint pain and myalgias.  Skin: Negative.   Neurological: Negative.  Negative for dizziness and  headaches.  Endo/Heme/Allergies: Negative.   All other systems reviewed and are negative.      Objective:   BP 124/82   Pulse 96   Ht 5\' 2"  (1.575 m)   Wt 144 lb 3.2 oz (65.4 kg)   SpO2 98%   BMI 26.37 kg/m   Vitals:   05/08/24 1002  BP: 124/82  Pulse: 96  Height: 5\' 2"  (1.575 m)  Weight: 144 lb 3.2 oz (65.4 kg)  SpO2: 98%  BMI (Calculated): 26.37    Physical Exam Vitals and nursing note reviewed.  Constitutional:      Appearance: Normal appearance. She is normal weight.  HENT:     Head: Normocephalic and atraumatic.     Nose: Nose normal.     Mouth/Throat:     Mouth: Mucous membranes are moist.  Eyes:     Extraocular Movements: Extraocular movements intact.     Conjunctiva/sclera: Conjunctivae normal.     Pupils: Pupils are equal, round, and reactive to light.  Cardiovascular:     Rate and Rhythm: Normal rate and regular rhythm.     Pulses: Normal pulses.     Heart sounds: Normal heart sounds.  Pulmonary:     Effort: Pulmonary effort is normal.     Breath sounds: Normal breath sounds.  Abdominal:     General: Abdomen is flat. Bowel sounds are normal.     Palpations: Abdomen is soft.  Musculoskeletal:        General: Normal range of motion.     Cervical back: Normal range of motion.  Skin:    General: Skin is warm and dry.  Neurological:     General: No focal deficit present.     Mental Status: She is alert and oriented to person, place, and time.  Psychiatric:        Mood and Affect: Mood normal.        Behavior: Behavior normal.        Thought Content: Thought content normal.        Judgment: Judgment normal.      No results found for any visits on 05/08/24.  Recent Results (from the past 2160 hours)  CMP14+EGFR  Status: Abnormal   Collection Time: 05/07/24 10:13 AM  Result Value Ref Range   Glucose 145 (H) 70 - 99 mg/dL   BUN 16 6 - 24 mg/dL   Creatinine, Ser 6.57 0.57 - 1.00 mg/dL   eGFR 92 >84 ON/GEX/5.28   BUN/Creatinine Ratio 21 9  - 23   Sodium 142 134 - 144 mmol/L   Potassium 4.2 3.5 - 5.2 mmol/L   Chloride 104 96 - 106 mmol/L   CO2 22 20 - 29 mmol/L   Calcium 9.3 8.7 - 10.2 mg/dL   Total Protein 6.1 6.0 - 8.5 g/dL   Albumin 4.4 3.8 - 4.9 g/dL   Globulin, Total 1.7 1.5 - 4.5 g/dL   Bilirubin Total <4.1 0.0 - 1.2 mg/dL   Alkaline Phosphatase 79 44 - 121 IU/L   AST 12 0 - 40 IU/L   ALT 15 0 - 32 IU/L  Lipid panel     Status: None   Collection Time: 05/07/24 10:13 AM  Result Value Ref Range   Cholesterol, Total 106 100 - 199 mg/dL   Triglycerides 324 0 - 149 mg/dL   HDL 52 >40 mg/dL   VLDL Cholesterol Cal 19 5 - 40 mg/dL   LDL Chol Calc (NIH) 35 0 - 99 mg/dL   Chol/HDL Ratio 2.0 0.0 - 4.4 ratio    Comment:                                   T. Chol/HDL Ratio                                             Men  Women                               1/2 Avg.Risk  3.4    3.3                                   Avg.Risk  5.0    4.4                                2X Avg.Risk  9.6    7.1                                3X Avg.Risk 23.4   11.0   CBC with Diff     Status: Abnormal   Collection Time: 05/07/24 10:13 AM  Result Value Ref Range   WBC 9.7 3.4 - 10.8 x10E3/uL   RBC 3.83 3.77 - 5.28 x10E6/uL   Hemoglobin 10.7 (L) 11.1 - 15.9 g/dL   Hematocrit 10.2 (L) 72.5 - 46.6 %   MCV 88 79 - 97 fL   MCH 27.9 26.6 - 33.0 pg   MCHC 31.8 31.5 - 35.7 g/dL   RDW 36.6 44.0 - 34.7 %   Platelets 275 150 - 450 x10E3/uL   Neutrophils 68 Not Estab. %   Lymphs 24 Not Estab. %   Monocytes 5 Not Estab. %   Eos 2 Not Estab. %   Basos  1 Not Estab. %   Neutrophils Absolute 6.5 1.4 - 7.0 x10E3/uL   Lymphocytes Absolute 2.3 0.7 - 3.1 x10E3/uL   Monocytes Absolute 0.5 0.1 - 0.9 x10E3/uL   EOS (ABSOLUTE) 0.2 0.0 - 0.4 x10E3/uL   Basophils Absolute 0.1 0.0 - 0.2 x10E3/uL   Immature Granulocytes 0 Not Estab. %   Immature Grans (Abs) 0.0 0.0 - 0.1 x10E3/uL  Hemoglobin A1c     Status: Abnormal   Collection Time: 05/07/24 10:13 AM   Result Value Ref Range   Hgb A1c MFr Bld 8.4 (H) 4.8 - 5.6 %    Comment:          Prediabetes: 5.7 - 6.4          Diabetes: >6.4          Glycemic control for adults with diabetes: <7.0    Est. average glucose Bld gHb Est-mCnc 194 mg/dL      Assessment & Plan:  Continue same medications.   Problem List Items Addressed This Visit       Cardiovascular and Mediastinum   Benign essential hypertension - Primary     Endocrine   Diabetes mellitus without complication (HCC)     Other   Mixed hyperlipidemia    Return in about 4 months (around 09/07/2024) for fasting labs prior.   Total time spent: 25 minutes  [employer], NP  05/08/2024   This document may have been prepared by Dragon Voice Recognition software and as such may include unintentional dictation errors.

## 2024-05-15 DIAGNOSIS — F410 Panic disorder [episodic paroxysmal anxiety] without agoraphobia: Secondary | ICD-10-CM | POA: Diagnosis not present

## 2024-05-19 ENCOUNTER — Other Ambulatory Visit: Payer: Self-pay

## 2024-05-19 ENCOUNTER — Encounter: Payer: Self-pay | Admitting: Cardiovascular Disease

## 2024-05-19 ENCOUNTER — Ambulatory Visit (INDEPENDENT_AMBULATORY_CARE_PROVIDER_SITE_OTHER): Admitting: Cardiovascular Disease

## 2024-05-19 ENCOUNTER — Emergency Department
Admission: EM | Admit: 2024-05-19 | Discharge: 2024-05-19 | Disposition: A | Discharge status: Home / Self Care | Attending: Emergency Medicine | Admitting: Emergency Medicine

## 2024-05-19 VITALS — BP 128/86 | HR 92 | Ht 62.0 in | Wt 142.0 lb

## 2024-05-19 DIAGNOSIS — M545 Low back pain, unspecified: Secondary | ICD-10-CM | POA: Diagnosis not present

## 2024-05-19 DIAGNOSIS — R0789 Other chest pain: Secondary | ICD-10-CM

## 2024-05-19 DIAGNOSIS — E782 Mixed hyperlipidemia: Secondary | ICD-10-CM

## 2024-05-19 DIAGNOSIS — I4711 Inappropriate sinus tachycardia, so stated: Secondary | ICD-10-CM

## 2024-05-19 DIAGNOSIS — Y92093 Driveway of other non-institutional residence as the place of occurrence of the external cause: Secondary | ICD-10-CM | POA: Diagnosis not present

## 2024-05-19 DIAGNOSIS — I10 Essential (primary) hypertension: Secondary | ICD-10-CM | POA: Insufficient documentation

## 2024-05-19 DIAGNOSIS — E119 Type 2 diabetes mellitus without complications: Secondary | ICD-10-CM | POA: Diagnosis not present

## 2024-05-19 DIAGNOSIS — R0602 Shortness of breath: Secondary | ICD-10-CM | POA: Diagnosis not present

## 2024-05-19 DIAGNOSIS — R079 Chest pain, unspecified: Secondary | ICD-10-CM | POA: Diagnosis not present

## 2024-05-19 MED ORDER — ACETAMINOPHEN 325 MG PO TABS
650.0000 mg | ORAL_TABLET | Freq: Once | ORAL | Status: AC
  Administered 2024-05-19: 650 mg via ORAL
  Filled 2024-05-19: qty 2
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
## 2024-05-19 NOTE — Progress Notes (Signed)
 Cardiology Office Note   Date:  05/19/2024   ID:  Hannah Clarke, DOB Jul 09, 1966, MRN [id number]  PCP:  Alica Antu, NP  Cardiologist:  Debborah Fairly, MD      History of Present Illness: Hannah Clarke is a 58 y.o. female who presents for  Chief Complaint  Patient presents with   Follow-up    2 Months Follow Up     No complaints      Past Medical History:  Diagnosis Date   Anxiety    Chronic headaches    Depression    Dermatophytosis of nail    Diabetes mellitus without complication (HCC)    GERD (gastroesophageal reflux disease)    High cholesterol    History of kidney stones    Hypertension    Mental developmental delay    Onychia of toe    Panic attacks      Past Surgical History:  Procedure Laterality Date   COLONOSCOPY WITH PROPOFOL  N/A 10/09/2017   Procedure: COLONOSCOPY WITH PROPOFOL ;  Surgeon: Toledo, Alphonsus Jeans, MD;  Location: [facility];  Service: Gastroenterology;  Laterality: N/A;   COLONOSCOPY WITH PROPOFOL  N/A 02/06/2018   Procedure: COLONOSCOPY WITH PROPOFOL ;  Surgeon: Toledo, Alphonsus Jeans, MD;  Location: [facility];  Service: Gastroenterology;  Laterality: N/A;   ESOPHAGOGASTRODUODENOSCOPY (EGD) WITH PROPOFOL  N/A 10/09/2017   Procedure: ESOPHAGOGASTRODUODENOSCOPY (EGD) WITH PROPOFOL ;  Surgeon: Toledo, Alphonsus Jeans, MD;  Location: [facility];  Service: Gastroenterology;  Laterality: N/A;   ESOPHAGOGASTRODUODENOSCOPY (EGD) WITH PROPOFOL  N/A 07/31/2018   Procedure: ESOPHAGOGASTRODUODENOSCOPY (EGD) WITH PROPOFOL ;  Surgeon: Toledo, Alphonsus Jeans, MD;  Location: [facility];  Service: Gastroenterology;  Laterality: N/A;   ESOPHAGOGASTRODUODENOSCOPY (EGD) WITH PROPOFOL  N/A 11/29/2022   Procedure: ESOPHAGOGASTRODUODENOSCOPY (EGD) WITH PROPOFOL ;  Surgeon: Toledo, Alphonsus Jeans, MD;  Location: [facility];  Service: Gastroenterology;  Laterality: N/A;   HAND SURGERY     KIDNEY STONE SURGERY       Current Outpatient Medications  Medication Sig  Dispense Refill   acetaminophen  (TYLENOL ) 325 MG tablet Take 325 mg by mouth every 6 (six) hours as needed.     albuterol  (PROVENTIL  HFA;VENTOLIN  HFA) 108 (90 BASE) MCG/ACT inhaler Inhale 2 puffs into the lungs every 6 (six) hours as needed for wheezing or shortness of breath. 1 Inhaler 2   azelastine  (ASTELIN ) 0.1 % nasal spray Place 1 spray into both nostrils 2 (two) times daily. 30 mL 11   budesonide (PULMICORT) 0.5 MG/2ML nebulizer solution Inhale into the lungs.     buPROPion  (WELLBUTRIN  XL) 300 MG 24 hr tablet      busPIRone  (BUSPAR ) 30 MG tablet      celecoxib (CELEBREX) 100 MG capsule Take 100 mg by mouth daily.     cetirizine (ZYRTEC) 10 MG tablet TAKE 1 TABLET BY MOUTH EACH NIGHT AT BEDTIME FOR ALLERGY SYMPTOMS 30 tablet 11   Cholecalciferol  (GNP VITAMIN D3 EXTRA STRENGTH) 25 MCG (1000 UT) tablet TAKE 1 TABLET BY MOUTH ONCE EVERY DAY 30 tablet 11   citalopram  (CELEXA ) 20 MG tablet Take 20 mg by mouth daily.     Dupilumab 300 MG/2ML SOPN Inject into the skin.     famotidine (PEPCID) 40 MG tablet      fluticasone  (FLONASE ) 50 MCG/ACT nasal spray Place 1 spray into both nostrils daily. 16 g 11   Fluticasone -Umeclidin-Vilant (TRELEGY ELLIPTA) 200-62.5-25 MCG/ACT AEPB Inhale into the lungs.     gabapentin  (NEURONTIN ) 400 MG capsule      ipratropium (ATROVENT) 0.06 % nasal spray  ketorolac  (TORADOL ) 10 MG tablet Take 1 tablet (10 mg total) by mouth every 6 (six) hours as needed. 20 tablet 0   lactulose  (CHRONULAC ) 10 GM/15ML solution Take 30 mLs (20 g total) by mouth daily as needed for mild constipation. 120 mL 0   LINZESS  290 MCG CAPS capsule      LORazepam  (ATIVAN ) 1 MG tablet Take 1 tablet (1 mg total) by mouth every 8 (eight) hours as needed for anxiety. 15 tablet 0   losartan -hydrochlorothiazide (HYZAAR) 100-12.5 MG tablet TAKE 1 TABLET BY MOUTH DAILY 90 tablet 1   lubiprostone (AMITIZA) 24 MCG capsule Take by mouth.     metFORMIN (GLUCOPHAGE) 500 MG tablet Take 2 tablets by  mouth 2 (two) times daily.     metoprolol  tartrate (LOPRESSOR ) 50 MG tablet Take 1 tablet (50 mg total) by mouth 2 (two) times daily. 60 tablet 11   montelukast  (SINGULAIR ) 10 MG tablet Take 10 mg by mouth daily.     nortriptyline (PAMELOR) 50 MG capsule      ondansetron  (ZOFRAN ) 4 MG tablet Take 1 tablet by mouth every 8 (eight) hours as needed.     pantoprazole  (PROTONIX ) 40 MG tablet TAKE 1 TABLET BY MOUTH EVERY DAY (Patient taking differently: Take 40 mg by mouth 2 (two) times daily. Take 40mg  twice daily once in the morning before breakfast and once at dinner ON AN EMPTY STOMACH) 90 tablet 1   Peppermint Oil (IBGARD) 90 MG CPCR TAKE 2 CAPSULES BY MOUTH 2 TIMES A DAY BEFORE MEALS     polyethylene glycol (MIRALAX  MIX-IN PAX) 17 g packet Take 17 g by mouth daily. Take 3 times a day for 2 days, then 2 times a day for one week. Then may reduce to 1-2 times a day as needed. 14 each 0   potassium chloride  SA (K-DUR,KLOR-CON ) 20 MEQ tablet Take 20 mEq by mouth daily.     rosuvastatin (CRESTOR) 10 MG tablet TAKE 1 TABLET BY MOUTH EVERY DAY 90 tablet 3   sennosides-docusate sodium  (SENOKOT-S) 8.6-50 MG tablet Take 1 tablet by mouth daily.     simethicone  (MYLICON) 125 MG chewable tablet      sucralfate  (CARAFATE ) 1 g tablet Take 1 g by mouth 4 (four) times daily -  with meals and at bedtime.     TRUE METRIX BLOOD GLUCOSE TEST test strip 1 each 2 (two) times daily.     TRULICITY 3 MG/0.5ML SOAJ      venlafaxine XR (EFFEXOR-XR) 75 MG 24 hr capsule Take 75 mg by mouth daily.     VOQUEZNA 20 MG TABS Take by mouth.     No current facility-administered medications for this visit.    Allergies:   Patient has no known allergies.    Social History:   reports that she has never smoked. She has never used smokeless tobacco. She reports that she does not currently use alcohol. She reports that she does not use drugs.   Family History:  family history includes Breast cancer (age of onset: 74) in her mother;  Diabetes in her father, paternal aunt, paternal grandfather, paternal grandmother, and paternal uncle.    ROS:     Review of Systems  Constitutional: Negative.   HENT: Negative.    Eyes: Negative.   Respiratory: Negative.    Gastrointestinal: Negative.   Genitourinary: Negative.   Musculoskeletal: Negative.   Skin: Negative.   Neurological: Negative.   Endo/Heme/Allergies: Negative.   Psychiatric/Behavioral: Negative.    All other systems  reviewed and are negative.     All other systems are reviewed and negative.    PHYSICAL EXAM: VS:  BP 128/86   Pulse 92   Ht 5' 2 (1.575 m)   Wt 142 lb (64.4 kg)   SpO2 98%   BMI 25.97 kg/m  , BMI Body mass index is 25.97 kg/m. Last weight:  Wt Readings from Last 3 Encounters:  05/19/24 142 lb (64.4 kg)  05/08/24 144 lb 3.2 oz (65.4 kg)  03/18/24 147 lb (66.7 kg)     Physical Exam Constitutional:      Appearance: Normal appearance.   Cardiovascular:     Rate and Rhythm: Normal rate and regular rhythm.     Heart sounds: Normal heart sounds.  Pulmonary:     Effort: Pulmonary effort is normal.     Breath sounds: Normal breath sounds.   Musculoskeletal:     Right lower leg: No edema.     Left lower leg: No edema.   Neurological:     Mental Status: She is alert.       EKG:   Recent Labs: 09/03/2023: TSH 0.842 05/07/2024: ALT 15; BUN 16; Creatinine, Ser 0.76; Hemoglobin 10.7; Platelets 275; Potassium 4.2; Sodium 142    Lipid Panel    Component Value Date/Time   CHOL 106 05/07/2024 1013   TRIG 103 05/07/2024 1013   HDL 52 05/07/2024 1013   CHOLHDL 2.0 05/07/2024 1013   CHOLHDL 3.5 08/18/2017 0450   VLDL 54 (H) 08/18/2017 0450   LDLCALC 35 05/07/2024 1013      Other studies Reviewed: Additional studies/ records that were reviewed today include:  Review of the above records demonstrates:       No data to display            ASSESSMENT AND PLAN:    ICD-10-CM   1. Other chest pain  R07.89    Had  heart burn but got better with protonix .    2. Mixed hyperlipidemia  E78.2     3. SOB (shortness of breath)  R06.02     4. Benign essential hypertension  I10     5. Inappropriate sinus tachycardia (HCC)  I47.11        Problem List Items Addressed This Visit       Cardiovascular and Mediastinum   Benign essential hypertension     Other   Mixed hyperlipidemia   Other Visit Diagnoses       Other chest pain    -  Primary   Had heart burn but got better with protonix .     SOB (shortness of breath)         Inappropriate sinus tachycardia (HCC)              Disposition:   Return in about 3 months (around 08/19/2024).    Total time spent: 30 minutes  Signed,  Debborah Fairly, MD  05/19/2024 10:02 AM    [facility]
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
## 2024-05-20 DIAGNOSIS — I152 Hypertension secondary to endocrine disorders: Secondary | ICD-10-CM | POA: Diagnosis not present

## 2024-05-20 DIAGNOSIS — E1159 Type 2 diabetes mellitus with other circulatory complications: Secondary | ICD-10-CM | POA: Diagnosis not present

## 2024-05-20 DIAGNOSIS — J4541 Moderate persistent asthma with (acute) exacerbation: Secondary | ICD-10-CM | POA: Diagnosis not present

## 2024-05-20 DIAGNOSIS — E785 Hyperlipidemia, unspecified: Secondary | ICD-10-CM | POA: Diagnosis not present

## 2024-05-20 DIAGNOSIS — E1165 Type 2 diabetes mellitus with hyperglycemia: Secondary | ICD-10-CM | POA: Diagnosis not present

## 2024-05-20 DIAGNOSIS — E1169 Type 2 diabetes mellitus with other specified complication: Secondary | ICD-10-CM | POA: Diagnosis not present

## 2024-06-12 DIAGNOSIS — J8283 Eosinophilic asthma: Secondary | ICD-10-CM | POA: Diagnosis not present

## 2024-06-12 DIAGNOSIS — J4541 Moderate persistent asthma with (acute) exacerbation: Secondary | ICD-10-CM | POA: Diagnosis not present

## 2024-06-26 DIAGNOSIS — J4541 Moderate persistent asthma with (acute) exacerbation: Secondary | ICD-10-CM | POA: Diagnosis not present

## 2024-06-26 DIAGNOSIS — J8283 Eosinophilic asthma: Secondary | ICD-10-CM | POA: Diagnosis not present

## 2024-07-09 ENCOUNTER — Ambulatory Visit: Payer: Self-pay | Admitting: Urology

## 2024-07-11 DIAGNOSIS — J4541 Moderate persistent asthma with (acute) exacerbation: Secondary | ICD-10-CM | POA: Diagnosis not present

## 2024-07-11 DIAGNOSIS — J8283 Eosinophilic asthma: Secondary | ICD-10-CM | POA: Diagnosis not present

## 2024-07-16 ENCOUNTER — Ambulatory Visit (INDEPENDENT_AMBULATORY_CARE_PROVIDER_SITE_OTHER): Admitting: Urology

## 2024-07-16 ENCOUNTER — Other Ambulatory Visit: Payer: Self-pay | Admitting: *Deleted

## 2024-07-16 ENCOUNTER — Encounter: Payer: Self-pay | Admitting: Urology

## 2024-07-16 ENCOUNTER — Ambulatory Visit
Admission: RE | Admit: 2024-07-16 | Discharge: 2024-07-16 | Disposition: A | Discharge status: Home / Self Care | Attending: Urology | Admitting: Urology

## 2024-07-16 ENCOUNTER — Ambulatory Visit
Admission: RE | Admit: 2024-07-16 | Discharge: 2024-07-16 | Disposition: A | Discharge status: Home / Self Care | Source: Ambulatory Visit | Attending: Urology | Admitting: Urology

## 2024-07-16 VITALS — BP 127/83 | HR 99 | Ht 62.0 in | Wt 142.0 lb

## 2024-07-16 DIAGNOSIS — N200 Calculus of kidney: Secondary | ICD-10-CM

## 2024-07-16 DIAGNOSIS — I878 Other specified disorders of veins: Secondary | ICD-10-CM | POA: Diagnosis not present

## 2024-07-16 DIAGNOSIS — R1030 Lower abdominal pain, unspecified: Secondary | ICD-10-CM

## 2024-07-16 NOTE — Progress Notes (Signed)
 07/16/2024 2:55 PM   Hannah Clarke [birth year] [id number]  Referring provider: Glennon Sand, NP [address], [address] [place],  [place] [address]  Chief Complaint  Patient presents with   Nephrolithiasis    Urologic history: 1.  Left nephrolithiasis Nonobstructing left renal calculus   HPI: Hannah Clarke is a 58 y.o. female who presents for annual follow-up.  Complains of occasional bilateral abdominal pain No bothersome LUTS Denies dysuria, gross hematuria   PMH: Past Medical History:  Diagnosis Date   Anxiety    Chronic headaches    Depression    Dermatophytosis of nail    Diabetes mellitus without complication (HCC)    GERD (gastroesophageal reflux disease)    High cholesterol    History of kidney stones    Hypertension    Mental developmental delay    Onychia of toe    Panic attacks     Surgical History: Past Surgical History:  Procedure Laterality Date   COLONOSCOPY WITH PROPOFOL  N/A 10/09/2017   Procedure: COLONOSCOPY WITH PROPOFOL ;  Surgeon: Toledo, Ladell POUR, MD;  Location: [facility];  Service: Gastroenterology;  Laterality: N/A;   COLONOSCOPY WITH PROPOFOL  N/A 02/06/2018   Procedure: COLONOSCOPY WITH PROPOFOL ;  Surgeon: Toledo, Ladell POUR, MD;  Location: [facility];  Service: Gastroenterology;  Laterality: N/A;   ESOPHAGOGASTRODUODENOSCOPY (EGD) WITH PROPOFOL  N/A 10/09/2017   Procedure: ESOPHAGOGASTRODUODENOSCOPY (EGD) WITH PROPOFOL ;  Surgeon: Toledo, Ladell POUR, MD;  Location: [facility];  Service: Gastroenterology;  Laterality: N/A;   ESOPHAGOGASTRODUODENOSCOPY (EGD) WITH PROPOFOL  N/A 07/31/2018   Procedure: ESOPHAGOGASTRODUODENOSCOPY (EGD) WITH PROPOFOL ;  Surgeon: Toledo, Ladell POUR, MD;  Location: [facility];  Service: Gastroenterology;  Laterality: N/A;   ESOPHAGOGASTRODUODENOSCOPY (EGD) WITH PROPOFOL  N/A 11/29/2022   Procedure: ESOPHAGOGASTRODUODENOSCOPY (EGD) WITH PROPOFOL ;  Surgeon: Toledo, Ladell POUR, MD;  Location:  [facility];  Service: Gastroenterology;  Laterality: N/A;   HAND SURGERY     KIDNEY STONE SURGERY      Home Medications:  Allergies as of 07/16/2024   No Known Allergies      Medication List        Accurate as of July 16, 2024  2:55 PM. If you have any questions, ask your nurse or doctor.          acetaminophen  325 MG tablet Commonly known as: TYLENOL  Take 325 mg by mouth every 6 (six) hours as needed.   albuterol  108 (90 Base) MCG/ACT inhaler Commonly known as: VENTOLIN  HFA Inhale 2 puffs into the lungs every 6 (six) hours as needed for wheezing or shortness of breath.   azelastine  0.1 % nasal spray Commonly known as: ASTELIN  Place 1 spray into both nostrils 2 (two) times daily.   budesonide 0.5 MG/2ML nebulizer solution Commonly known as: PULMICORT Inhale into the lungs.   buPROPion  300 MG 24 hr tablet Commonly known as: WELLBUTRIN  XL   busPIRone  30 MG tablet Commonly known as: BUSPAR    celecoxib 100 MG capsule Commonly known as: CELEBREX Take 100 mg by mouth daily.   cetirizine 10 MG tablet Commonly known as: ZYRTEC TAKE 1 TABLET BY MOUTH EACH NIGHT AT BEDTIME FOR ALLERGY SYMPTOMS   citalopram  20 MG tablet Commonly known as: CELEXA  Take 20 mg by mouth daily.   Dupilumab 300 MG/2ML Soaj Inject into the skin.   famotidine 40 MG tablet Commonly known as: PEPCID   fluticasone  50 MCG/ACT nasal spray Commonly known as: FLONASE  Place 1 spray into both nostrils daily.   gabapentin  400 MG capsule Commonly known as:  NEURONTIN    GNP Vitamin D3 Extra Strength 25 MCG (1000 UT) tablet Generic drug: Cholecalciferol  TAKE 1 TABLET BY MOUTH ONCE EVERY DAY   IBgard 90 MG Cpcr Generic drug: Peppermint Oil TAKE 2 CAPSULES BY MOUTH 2 TIMES A DAY BEFORE MEALS   ipratropium 0.06 % nasal spray Commonly known as: ATROVENT   ketorolac  10 MG tablet Commonly known as: TORADOL  Take 1 tablet (10 mg total) by mouth every 6 (six) hours as needed.   lactulose   10 GM/15ML solution Commonly known as: CHRONULAC  Take 30 mLs (20 g total) by mouth daily as needed for mild constipation.   Linzess  290 MCG Caps capsule Generic drug: linaclotide    LORazepam  1 MG tablet Commonly known as: ATIVAN  Take 1 tablet (1 mg total) by mouth every 8 (eight) hours as needed for anxiety.   losartan -hydrochlorothiazide 100-12.5 MG tablet Commonly known as: HYZAAR TAKE 1 TABLET BY MOUTH DAILY   lubiprostone 24 MCG capsule Commonly known as: AMITIZA Take by mouth.   metFORMIN 500 MG tablet Commonly known as: GLUCOPHAGE Take 2 tablets by mouth 2 (two) times daily.   metoprolol  tartrate 50 MG tablet Commonly known as: LOPRESSOR  Take 1 tablet (50 mg total) by mouth 2 (two) times daily.   montelukast  10 MG tablet Commonly known as: SINGULAIR  Take 10 mg by mouth daily.   nortriptyline 50 MG capsule Commonly known as: PAMELOR   ondansetron  4 MG tablet Commonly known as: ZOFRAN  Take 1 tablet by mouth every 8 (eight) hours as needed.   pantoprazole  40 MG tablet Commonly known as: PROTONIX  TAKE 1 TABLET BY MOUTH EVERY DAY What changed:  when to take this additional instructions   polyethylene glycol 17 g packet Commonly known as: MiraLax  Mix-In Pax Take 17 g by mouth daily. Take 3 times a day for 2 days, then 2 times a day for one week. Then may reduce to 1-2 times a day as needed.   potassium chloride  SA 20 MEQ tablet Commonly known as: KLOR-CON  M Take 20 mEq by mouth daily.   rosuvastatin 10 MG tablet Commonly known as: CRESTOR TAKE 1 TABLET BY MOUTH EVERY DAY   sennosides-docusate sodium  8.6-50 MG tablet Commonly known as: SENOKOT-S Take 1 tablet by mouth daily.   simethicone  125 MG chewable tablet Commonly known as: MYLICON   sucralfate  1 g tablet Commonly known as: CARAFATE  Take 1 g by mouth 4 (four) times daily -  with meals and at bedtime.   Trelegy Ellipta 200-62.5-25 MCG/ACT Aepb Generic drug: Fluticasone -Umeclidin-Vilant Inhale  into the lungs.   True Metrix Blood Glucose Test test strip Generic drug: glucose blood 1 each 2 (two) times daily.   Trulicity 3 MG/0.5ML Soaj Generic drug: Dulaglutide   venlafaxine XR 75 MG 24 hr capsule Commonly known as: EFFEXOR-XR Take 75 mg by mouth daily.   Voquezna 20 MG Tabs Generic drug: Vonoprazan Fumarate Take by mouth.        Allergies: No Known Allergies  Family History: Family History  Problem Relation Age of Onset   Breast cancer Mother 22       pre pt.    Diabetes Father    Diabetes Paternal Aunt    Diabetes Paternal Uncle    Diabetes Paternal Grandmother    Diabetes Paternal Grandfather     Social History:  reports that she has never smoked. She has never used smokeless tobacco. She reports that she does not currently use alcohol. She reports that she does not use drugs.   Physical Exam:  BP 127/83   Pulse 99   Ht 5' 2 (1.575 m)   Wt 142 lb (64.4 kg)   BMI 25.97 kg/m   Constitutional:  Alert, No acute distress. HEENT: [place] AT Respiratory: Normal respiratory effort, no increased work of breathing.    Pertinent Imaging: KUB performed earlier today was personally reviewed and interpreted.  Stable calcification overlying the lower portion of the left renal outline  Assessment & Plan:    1.  Left nephrolithiasis Stable lower pole calculus  2.  Abdominal pain Intermittent generalized abdominal pain RUS ordered and will contact with results   Glendia JAYSON Barba, MD  [facility] [address], [address] [place], [place] [address] [phone]

## 2024-07-22 ENCOUNTER — Ambulatory Visit
Admission: RE | Admit: 2024-07-22 | Discharge: 2024-07-22 | Disposition: A | Discharge status: Home / Self Care | Source: Ambulatory Visit | Attending: Urology | Admitting: Urology

## 2024-07-22 DIAGNOSIS — N200 Calculus of kidney: Secondary | ICD-10-CM | POA: Diagnosis not present

## 2024-07-22 DIAGNOSIS — N281 Cyst of kidney, acquired: Secondary | ICD-10-CM | POA: Diagnosis not present

## 2024-08-04 ENCOUNTER — Ambulatory Visit: Payer: Self-pay | Admitting: Urology

## 2024-08-05 DIAGNOSIS — J8283 Eosinophilic asthma: Secondary | ICD-10-CM | POA: Diagnosis not present

## 2024-08-05 DIAGNOSIS — J4540 Moderate persistent asthma, uncomplicated: Secondary | ICD-10-CM | POA: Diagnosis not present

## 2024-08-12 DIAGNOSIS — K449 Diaphragmatic hernia without obstruction or gangrene: Secondary | ICD-10-CM | POA: Diagnosis not present

## 2024-08-12 DIAGNOSIS — J4540 Moderate persistent asthma, uncomplicated: Secondary | ICD-10-CM | POA: Diagnosis not present

## 2024-08-12 DIAGNOSIS — J4541 Moderate persistent asthma with (acute) exacerbation: Secondary | ICD-10-CM | POA: Insufficient documentation

## 2024-08-12 DIAGNOSIS — K219 Gastro-esophageal reflux disease without esophagitis: Secondary | ICD-10-CM | POA: Diagnosis not present

## 2024-08-12 DIAGNOSIS — K2100 Gastro-esophageal reflux disease with esophagitis, without bleeding: Secondary | ICD-10-CM | POA: Diagnosis not present

## 2024-08-19 ENCOUNTER — Ambulatory Visit: Admitting: Cardiovascular Disease

## 2024-08-19 DIAGNOSIS — J4541 Moderate persistent asthma with (acute) exacerbation: Secondary | ICD-10-CM | POA: Diagnosis not present

## 2024-08-25 DIAGNOSIS — H53149 Visual discomfort, unspecified: Secondary | ICD-10-CM | POA: Diagnosis not present

## 2024-08-25 DIAGNOSIS — R519 Headache, unspecified: Secondary | ICD-10-CM | POA: Diagnosis not present

## 2024-08-25 DIAGNOSIS — G479 Sleep disorder, unspecified: Secondary | ICD-10-CM | POA: Diagnosis not present

## 2024-09-02 ENCOUNTER — Ambulatory Visit: Admitting: Cardiovascular Disease

## 2024-09-02 ENCOUNTER — Encounter: Payer: Self-pay | Admitting: Cardiovascular Disease

## 2024-09-02 VITALS — BP 121/84 | HR 82 | Ht 62.0 in | Wt 156.6 lb

## 2024-09-02 DIAGNOSIS — I10 Essential (primary) hypertension: Secondary | ICD-10-CM | POA: Diagnosis not present

## 2024-09-02 DIAGNOSIS — K219 Gastro-esophageal reflux disease without esophagitis: Secondary | ICD-10-CM | POA: Diagnosis not present

## 2024-09-02 DIAGNOSIS — E782 Mixed hyperlipidemia: Secondary | ICD-10-CM

## 2024-09-02 DIAGNOSIS — I4711 Inappropriate sinus tachycardia, so stated: Secondary | ICD-10-CM | POA: Diagnosis not present

## 2024-09-02 DIAGNOSIS — J4541 Moderate persistent asthma with (acute) exacerbation: Secondary | ICD-10-CM | POA: Diagnosis not present
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
## 2024-09-08 ENCOUNTER — Other Ambulatory Visit

## 2024-09-08 DIAGNOSIS — E1121 Type 2 diabetes mellitus with diabetic nephropathy: Secondary | ICD-10-CM

## 2024-09-08 DIAGNOSIS — R4189 Other symptoms and signs involving cognitive functions and awareness: Secondary | ICD-10-CM | POA: Diagnosis not present

## 2024-09-08 DIAGNOSIS — K219 Gastro-esophageal reflux disease without esophagitis: Secondary | ICD-10-CM | POA: Diagnosis not present

## 2024-09-08 DIAGNOSIS — J8283 Eosinophilic asthma: Secondary | ICD-10-CM | POA: Diagnosis not present

## 2024-09-08 DIAGNOSIS — R1314 Dysphagia, pharyngoesophageal phase: Secondary | ICD-10-CM | POA: Diagnosis not present

## 2024-09-08 DIAGNOSIS — K449 Diaphragmatic hernia without obstruction or gangrene: Secondary | ICD-10-CM | POA: Diagnosis not present

## 2024-09-08 DIAGNOSIS — K581 Irritable bowel syndrome with constipation: Secondary | ICD-10-CM | POA: Diagnosis not present

## 2024-09-08 DIAGNOSIS — E782 Mixed hyperlipidemia: Secondary | ICD-10-CM | POA: Diagnosis not present

## 2024-09-08 DIAGNOSIS — R110 Nausea: Secondary | ICD-10-CM | POA: Diagnosis not present

## 2024-09-08 DIAGNOSIS — I10 Essential (primary) hypertension: Secondary | ICD-10-CM | POA: Diagnosis not present

## 2024-09-09 ENCOUNTER — Ambulatory Visit (INDEPENDENT_AMBULATORY_CARE_PROVIDER_SITE_OTHER): Admitting: Cardiology

## 2024-09-09 ENCOUNTER — Encounter: Payer: Self-pay | Admitting: Cardiology

## 2024-09-09 ENCOUNTER — Ambulatory Visit: Payer: Self-pay | Admitting: Cardiology

## 2024-09-09 VITALS — BP 110/82 | HR 85 | Ht 62.0 in | Wt 156.2 lb

## 2024-09-09 DIAGNOSIS — I10 Essential (primary) hypertension: Secondary | ICD-10-CM

## 2024-09-09 DIAGNOSIS — D508 Other iron deficiency anemias: Secondary | ICD-10-CM

## 2024-09-09 DIAGNOSIS — E782 Mixed hyperlipidemia: Secondary | ICD-10-CM

## 2024-09-09 DIAGNOSIS — D649 Anemia, unspecified: Secondary | ICD-10-CM | POA: Insufficient documentation

## 2024-09-09 DIAGNOSIS — E119 Type 2 diabetes mellitus without complications: Secondary | ICD-10-CM | POA: Diagnosis not present

## 2024-09-09 LAB — LIPID PANEL
Chol/HDL Ratio: 2.1 ratio (ref 0.0–4.4)
Cholesterol, Total: 117 mg/dL (ref 100–199)
HDL: 56 mg/dL (ref >39)
LDL Chol Calc (NIH): 42 mg/dL (ref 0–99)
Triglycerides: 101 mg/dL (ref 0–149)
VLDL Cholesterol Cal: 19 mg/dL (ref 5–40)

## 2024-09-09 LAB — CMP14+EGFR
ALT: 16 IU/L (ref 0–32)
AST: 9 IU/L (ref 0–40)
Albumin: 4.3 g/dL (ref 3.8–4.9)
Alkaline Phosphatase: 72 IU/L (ref 49–135)
BUN/Creatinine Ratio: 28 — ABNORMAL HIGH (ref 9–23)
BUN: 19 mg/dL (ref 6–24)
Bilirubin Total: <0.2 mg/dL (ref 0.0–1.2)
CO2: 22 mmol/L (ref 20–29)
Calcium: 9.6 mg/dL (ref 8.7–10.2)
Chloride: 105 mmol/L (ref 96–106)
Creatinine, Ser: 0.67 mg/dL (ref 0.57–1.00)
Globulin, Total: 1.8 g/dL (ref 1.5–4.5)
Glucose: 155 mg/dL — ABNORMAL HIGH (ref 70–99)
Potassium: 4 mmol/L (ref 3.5–5.2)
Sodium: 142 mmol/L (ref 134–144)
Total Protein: 6.1 g/dL (ref 6.0–8.5)
eGFR: 103 mL/min/1.73 (ref >59)

## 2024-09-09 LAB — CBC WITH DIFFERENTIAL/PLATELET
Basophils Absolute: 0.1 x10E3/uL (ref 0.0–0.2)
Basos: 1 %
EOS (ABSOLUTE): 0.2 x10E3/uL (ref 0.0–0.4)
Eos: 3 %
Hematocrit: 30 % — ABNORMAL LOW (ref 34.0–46.6)
Hemoglobin: 9.6 g/dL — ABNORMAL LOW (ref 11.1–15.9)
Immature Grans (Abs): 0 x10E3/uL (ref 0.0–0.1)
Immature Granulocytes: 0 %
Lymphocytes Absolute: 1.3 x10E3/uL (ref 0.7–3.1)
Lymphs: 24 %
MCH: 29.6 pg (ref 26.6–33.0)
MCHC: 32 g/dL (ref 31.5–35.7)
MCV: 93 fL (ref 79–97)
Monocytes Absolute: 0.4 x10E3/uL (ref 0.1–0.9)
Monocytes: 7 %
Neutrophils Absolute: 3.4 x10E3/uL (ref 1.4–7.0)
Neutrophils: 65 %
Platelets: 324 x10E3/uL (ref 150–450)
RBC: 3.24 x10E6/uL — ABNORMAL LOW (ref 3.77–5.28)
RDW: 13.7 % (ref 11.7–15.4)
WBC: 5.3 x10E3/uL (ref 3.4–10.8)

## 2024-09-09 LAB — HEMOGLOBIN A1C
Est. average glucose Bld gHb Est-mCnc: 148 mg/dL
Hgb A1c MFr Bld: 6.8 % — ABNORMAL HIGH (ref 4.8–5.6)

## 2024-09-09 MED ORDER — FERROUS SULFATE 325 (65 FE) MG PO TABS: 325.0000 mg | ORAL_TABLET | Freq: Every day | ORAL | 1 refills | Status: AC

## 2024-09-09 NOTE — Progress Notes (Signed)
 Established Patient Office Visit  Subjective:  Patient ID: Hannah Clarke, female    DOB: 11-29-1966  Age: 58 y.o. MRN: [id number]  Chief Complaint  Patient presents with   Follow-up    4 month lab results    Patient in office for 4 month follow up, discuss recent lab work. Patient doing well, no complaints today. Discussed recent lab work. Hgb A1c improved. Hgb and Hct slightly decreased, add iron supplement daily. Continue all medications.    No other concerns at this time.   Past Medical History:  Diagnosis Date   Anxiety    Chronic headaches    Depression    Dermatophytosis of nail    Diabetes mellitus without complication (HCC)    GERD (gastroesophageal reflux disease)    High cholesterol    History of kidney stones    Hypertension    Mental developmental delay    Onychia of toe    Panic attacks     Past Surgical History:  Procedure Laterality Date   COLONOSCOPY WITH PROPOFOL  N/A 10/09/2017   Procedure: COLONOSCOPY WITH PROPOFOL ;  Surgeon: Toledo, Ladell POUR, MD;  Location: [facility];  Service: Gastroenterology;  Laterality: N/A;   COLONOSCOPY WITH PROPOFOL  N/A 02/06/2018   Procedure: COLONOSCOPY WITH PROPOFOL ;  Surgeon: Toledo, Ladell POUR, MD;  Location: [facility];  Service: Gastroenterology;  Laterality: N/A;   ESOPHAGOGASTRODUODENOSCOPY (EGD) WITH PROPOFOL  N/A 10/09/2017   Procedure: ESOPHAGOGASTRODUODENOSCOPY (EGD) WITH PROPOFOL ;  Surgeon: Toledo, Ladell POUR, MD;  Location: [facility];  Service: Gastroenterology;  Laterality: N/A;   ESOPHAGOGASTRODUODENOSCOPY (EGD) WITH PROPOFOL  N/A 07/31/2018   Procedure: ESOPHAGOGASTRODUODENOSCOPY (EGD) WITH PROPOFOL ;  Surgeon: Toledo, Ladell POUR, MD;  Location: [facility];  Service: Gastroenterology;  Laterality: N/A;   ESOPHAGOGASTRODUODENOSCOPY (EGD) WITH PROPOFOL  N/A 11/29/2022   Procedure: ESOPHAGOGASTRODUODENOSCOPY (EGD) WITH PROPOFOL ;  Surgeon: Toledo, Ladell POUR, MD;  Location: [facility];  Service:  Gastroenterology;  Laterality: N/A;   HAND SURGERY     KIDNEY STONE SURGERY      Social History   Socioeconomic History   Marital status: Single    Spouse name: Not on file   Number of children: Not on file   Years of education: Not on file   Highest education level: Not on file  Occupational History   Not on file  Tobacco Use   Smoking status: Never   Smokeless tobacco: Never  Vaping Use   Vaping status: Never Used  Substance and Sexual Activity   Alcohol use: Not Currently    Comment: OCCASSIONALLY   Drug use: No   Sexual activity: Not on file  Other Topics Concern   Not on file  Social History Narrative   Not on file   Social Drivers of Health   Financial Resource Strain: Low Risk  (02/05/2024)   Received [facility]   Overall Financial Resource Strain (CARDIA)    Difficulty of Paying Living Expenses: Not hard at all  Food Insecurity: No Food Insecurity (02/05/2024)   Received from [facility]   Hunger Vital Sign    Within the past 12 months, you worried that your food would run out before you got the money to buy more.: Never true    Within the past 12 months, the food you bought just didn't last and you didn't have money to get more.: Never true  Transportation Needs: Unmet Transportation Needs (02/05/2024)   Received from [facility] - Transportation    In  the past 12 months, has lack of transportation kept you from medical appointments or from getting medications?: No    Lack of Transportation (Non-Medical): Yes  Physical Activity: Not on file  Stress: Not on file  Social Connections: Not on file  Intimate Partner Violence: Not on file    Family History  Problem Relation Age of Onset   Breast cancer Mother 50       pre pt.    Diabetes Father    Diabetes Paternal Aunt    Diabetes Paternal Uncle    Diabetes Paternal Grandmother    Diabetes Paternal Grandfather     No Known  Allergies  Outpatient Medications Prior to Visit  Medication Sig   acetaminophen  (TYLENOL ) 325 MG tablet Take 325 mg by mouth every 6 (six) hours as needed.   albuterol  (PROVENTIL  HFA;VENTOLIN  HFA) 108 (90 BASE) MCG/ACT inhaler Inhale 2 puffs into the lungs every 6 (six) hours as needed for wheezing or shortness of breath.   azelastine  (ASTELIN ) 0.1 % nasal spray Place 1 spray into both nostrils 2 (two) times daily.   benzonatate  (TESSALON ) 100 MG capsule Take 100 mg by mouth as needed for cough.   celecoxib (CELEBREX) 100 MG capsule Take 100 mg by mouth daily.   cetirizine (ZYRTEC) 10 MG tablet TAKE 1 TABLET BY MOUTH EACH NIGHT AT BEDTIME FOR ALLERGY SYMPTOMS   Cholecalciferol  (GNP VITAMIN D3 EXTRA STRENGTH) 25 MCG (1000 UT) tablet TAKE 1 TABLET BY MOUTH ONCE EVERY DAY   citalopram  (CELEXA ) 20 MG tablet Take 20 mg by mouth daily.   Dupilumab 300 MG/2ML SOPN Inject into the skin.   famotidine (PEPCID) 40 MG tablet    Fluticasone -Umeclidin-Vilant (TRELEGY ELLIPTA) 200-62.5-25 MCG/ACT AEPB Inhale into the lungs.   ipratropium (ATROVENT) 0.06 % nasal spray    LINZESS  290 MCG CAPS capsule    LORazepam  (ATIVAN ) 1 MG tablet Take 1 tablet (1 mg total) by mouth every 8 (eight) hours as needed for anxiety.   losartan -hydrochlorothiazide (HYZAAR) 100-12.5 MG tablet TAKE 1 TABLET BY MOUTH DAILY   metFORMIN (GLUCOPHAGE) 500 MG tablet Take 2 tablets by mouth 2 (two) times daily.   metoprolol  tartrate (LOPRESSOR ) 50 MG tablet Take 1 tablet (50 mg total) by mouth 2 (two) times daily.   montelukast  (SINGULAIR ) 10 MG tablet Take 10 mg by mouth daily.   nortriptyline (PAMELOR) 50 MG capsule    ondansetron  (ZOFRAN ) 4 MG tablet Take 1 tablet by mouth every 8 (eight) hours as needed.   pantoprazole  (PROTONIX ) 40 MG tablet TAKE 1 TABLET BY MOUTH EVERY DAY   Peppermint Oil (IBGARD) 90 MG CPCR TAKE 2 CAPSULES BY MOUTH 2 TIMES A DAY BEFORE MEALS   polyethylene glycol (MIRALAX  MIX-IN PAX) 17 g packet Take 17 g by  mouth daily. Take 3 times a day for 2 days, then 2 times a day for one week. Then may reduce to 1-2 times a day as needed.   potassium chloride  SA (K-DUR,KLOR-CON ) 20 MEQ tablet Take 20 mEq by mouth daily.   rosuvastatin (CRESTOR) 10 MG tablet TAKE 1 TABLET BY MOUTH EVERY DAY   sennosides-docusate sodium  (SENOKOT-S) 8.6-50 MG tablet Take 1 tablet by mouth daily.   simethicone  (MYLICON) 125 MG chewable tablet    sucralfate  (CARAFATE ) 1 g tablet Take 1 g by mouth 4 (four) times daily -  with meals and at bedtime.   TRUE METRIX BLOOD GLUCOSE TEST test strip 1 each 2 (two) times daily.   TRULICITY 3 MG/0.5ML SOAJ  venlafaxine XR (EFFEXOR-XR) 75 MG 24 hr capsule Take 75 mg by mouth daily.   VOQUEZNA 20 MG TABS Take by mouth.   budesonide (PULMICORT) 0.5 MG/2ML nebulizer solution Inhale into the lungs.   buPROPion  (WELLBUTRIN  XL) 300 MG 24 hr tablet    busPIRone  (BUSPAR ) 30 MG tablet    fluticasone  (FLONASE ) 50 MCG/ACT nasal spray Place 1 spray into both nostrils daily. (Patient not taking: Reported on 09/09/2024)   gabapentin  (NEURONTIN ) 400 MG capsule    ketorolac  (TORADOL ) 10 MG tablet Take 1 tablet (10 mg total) by mouth every 6 (six) hours as needed. (Patient not taking: Reported on 09/09/2024)   lactulose  (CHRONULAC ) 10 GM/15ML solution Take 30 mLs (20 g total) by mouth daily as needed for mild constipation. (Patient not taking: Reported on 09/09/2024)   lubiprostone (AMITIZA) 24 MCG capsule Take by mouth. (Patient not taking: Reported on 09/09/2024)   No facility-administered medications prior to visit.    Review of Systems  Constitutional: Negative.   HENT: Negative.    Eyes: Negative.   Respiratory: Negative.  Negative for shortness of breath.   Cardiovascular: Negative.  Negative for chest pain.  Gastrointestinal: Negative.  Negative for abdominal pain, constipation and diarrhea.  Genitourinary: Negative.   Musculoskeletal:  Negative for joint pain and myalgias.  Skin: Negative.    Neurological: Negative.  Negative for dizziness and headaches.  Endo/Heme/Allergies: Negative.   All other systems reviewed and are negative.      Objective:   BP 110/82   Pulse 85   Ht 5' 2 (1.575 m)   Wt 156 lb 3.2 oz (70.9 kg)   SpO2 95%   BMI 28.57 kg/m   Vitals:   09/09/24 1322  BP: 110/82  Pulse: 85  Height: 5' 2 (1.575 m)  Weight: 156 lb 3.2 oz (70.9 kg)  SpO2: 95%  BMI (Calculated): 28.56    Physical Exam Vitals and nursing note reviewed.  Constitutional:      Appearance: Normal appearance. She is normal weight.  HENT:     Head: Normocephalic and atraumatic.     Nose: Nose normal.     Mouth/Throat:     Mouth: Mucous membranes are moist.  Eyes:     Extraocular Movements: Extraocular movements intact.     Conjunctiva/sclera: Conjunctivae normal.     Pupils: Pupils are equal, round, and reactive to light.  Cardiovascular:     Rate and Rhythm: Normal rate and regular rhythm.     Pulses: Normal pulses.     Heart sounds: Normal heart sounds.  Pulmonary:     Effort: Pulmonary effort is normal.     Breath sounds: Normal breath sounds.  Abdominal:     General: Abdomen is flat. Bowel sounds are normal.     Palpations: Abdomen is soft.  Musculoskeletal:        General: Normal range of motion.     Cervical back: Normal range of motion.  Skin:    General: Skin is warm and dry.  Neurological:     General: No focal deficit present.     Mental Status: She is alert and oriented to person, place, and time.  Psychiatric:        Mood and Affect: Mood normal.        Behavior: Behavior normal.        Thought Content: Thought content normal.        Judgment: Judgment normal.      No results found for any visits on 09/09/24.  Recent Results (from the past 2160 hours)  Hemoglobin A1c     Status: Abnormal   Collection Time: 09/08/24 10:44 AM  Result Value Ref Range   Hgb A1c MFr Bld 6.8 (H) 4.8 - 5.6 %    Comment:          Prediabetes: 5.7 - 6.4           Diabetes: >6.4          Glycemic control for adults with diabetes: <7.0    Est. average glucose Bld gHb Est-mCnc 148 mg/dL  CBC with Diff     Status: Abnormal   Collection Time: 09/08/24 10:44 AM  Result Value Ref Range   WBC 5.3 3.4 - 10.8 x10E3/uL   RBC 3.24 (L) 3.77 - 5.28 x10E6/uL   Hemoglobin 9.6 (L) 11.1 - 15.9 g/dL   Hematocrit 69.9 (L) 65.9 - 46.6 %   MCV 93 79 - 97 fL   MCH 29.6 26.6 - 33.0 pg   MCHC 32.0 31.5 - 35.7 g/dL   RDW 86.2 88.2 - 84.5 %   Platelets 324 150 - 450 x10E3/uL   Neutrophils 65 Not Estab. %   Lymphs 24 Not Estab. %   Monocytes 7 Not Estab. %   Eos 3 Not Estab. %   Basos 1 Not Estab. %   Neutrophils Absolute 3.4 1.4 - 7.0 x10E3/uL   Lymphocytes Absolute 1.3 0.7 - 3.1 x10E3/uL   Monocytes Absolute 0.4 0.1 - 0.9 x10E3/uL   EOS (ABSOLUTE) 0.2 0.0 - 0.4 x10E3/uL   Basophils Absolute 0.1 0.0 - 0.2 x10E3/uL   Immature Granulocytes 0 Not Estab. %   Immature Grans (Abs) 0.0 0.0 - 0.1 x10E3/uL  Lipid panel     Status: None   Collection Time: 09/08/24 10:44 AM  Result Value Ref Range   Cholesterol, Total 117 100 - 199 mg/dL   Triglycerides 898 0 - 149 mg/dL   HDL 56 >60 mg/dL   VLDL Cholesterol Cal 19 5 - 40 mg/dL   LDL Chol Calc (NIH) 42 0 - 99 mg/dL   Chol/HDL Ratio 2.1 0.0 - 4.4 ratio    Comment:                                   T. Chol/HDL Ratio                                             Men  Women                               1/2 Avg.Risk  3.4    3.3                                   Avg.Risk  5.0    4.4                                2X Avg.Risk  9.6    7.1  3X Avg.Risk 23.4   11.0   CMP14+EGFR     Status: Abnormal   Collection Time: 09/08/24 10:44 AM  Result Value Ref Range   Glucose 155 (H) 70 - 99 mg/dL   BUN 19 6 - 24 mg/dL   Creatinine, Ser 9.32 0.57 - 1.00 mg/dL   eGFR 896 >40 fO/fpw/8.26   BUN/Creatinine Ratio 28 (H) 9 - 23   Sodium 142 134 - 144 mmol/L   Potassium 4.0 3.5 - 5.2 mmol/L   Chloride 105  96 - 106 mmol/L   CO2 22 20 - 29 mmol/L   Calcium 9.6 8.7 - 10.2 mg/dL   Total Protein 6.1 6.0 - 8.5 g/dL   Albumin 4.3 3.8 - 4.9 g/dL   Globulin, Total 1.8 1.5 - 4.5 g/dL   Bilirubin Total <9.7 0.0 - 1.2 mg/dL   Alkaline Phosphatase 72 49 - 135 IU/L   AST 9 0 - 40 IU/L   ALT 16 0 - 32 IU/L      Assessment & Plan:  Add iron supplement daily Continue same medications  Problem List Items Addressed This Visit       Cardiovascular and Mediastinum   Benign essential hypertension - Primary     Endocrine   Diabetes mellitus without complication (HCC)     Other   Mixed hyperlipidemia   Absolute anemia   Relevant Medications   ferrous sulfate 325 (65 FE) MG tablet    Return in about 4 months (around 01/10/2025) for fasting labs prior.   Total time spent: 25 minutes  [employer], NP  09/09/2024   This document may have been prepared by Dragon Voice Recognition software and as such may include unintentional dictation errors.

## 2024-09-18 DIAGNOSIS — J4541 Moderate persistent asthma with (acute) exacerbation: Secondary | ICD-10-CM | POA: Diagnosis not present

## 2024-09-22 ENCOUNTER — Ambulatory Visit: Admitting: Anesthesiology

## 2024-09-22 ENCOUNTER — Ambulatory Visit
Admission: RE | Admit: 2024-09-22 | Discharge: 2024-09-22 | Disposition: A | Discharge status: Home / Self Care | Attending: Gastroenterology | Admitting: Gastroenterology

## 2024-09-22 ENCOUNTER — Encounter: Payer: Self-pay | Admitting: Gastroenterology

## 2024-09-22 ENCOUNTER — Encounter
Admission: RE | Disposition: A | Discharge status: Home / Self Care | Payer: Self-pay | Source: Home / Self Care | Attending: Gastroenterology

## 2024-09-22 DIAGNOSIS — I4711 Inappropriate sinus tachycardia, so stated: Secondary | ICD-10-CM | POA: Insufficient documentation

## 2024-09-22 DIAGNOSIS — D649 Anemia, unspecified: Secondary | ICD-10-CM | POA: Insufficient documentation

## 2024-09-22 DIAGNOSIS — F32A Depression, unspecified: Secondary | ICD-10-CM | POA: Diagnosis not present

## 2024-09-22 DIAGNOSIS — K219 Gastro-esophageal reflux disease without esophagitis: Secondary | ICD-10-CM | POA: Diagnosis not present

## 2024-09-22 DIAGNOSIS — E119 Type 2 diabetes mellitus without complications: Secondary | ICD-10-CM | POA: Diagnosis not present

## 2024-09-22 DIAGNOSIS — J45909 Unspecified asthma, uncomplicated: Secondary | ICD-10-CM | POA: Insufficient documentation

## 2024-09-22 DIAGNOSIS — I10 Essential (primary) hypertension: Secondary | ICD-10-CM | POA: Diagnosis not present

## 2024-09-22 DIAGNOSIS — G4730 Sleep apnea, unspecified: Secondary | ICD-10-CM | POA: Insufficient documentation

## 2024-09-22 DIAGNOSIS — Z7984 Long term (current) use of oral hypoglycemic drugs: Secondary | ICD-10-CM | POA: Diagnosis not present

## 2024-09-22 DIAGNOSIS — F418 Other specified anxiety disorders: Secondary | ICD-10-CM | POA: Diagnosis not present

## 2024-09-22 DIAGNOSIS — E782 Mixed hyperlipidemia: Secondary | ICD-10-CM | POA: Diagnosis not present

## 2024-09-22 DIAGNOSIS — K449 Diaphragmatic hernia without obstruction or gangrene: Secondary | ICD-10-CM | POA: Insufficient documentation

## 2024-09-22 DIAGNOSIS — G709 Myoneural disorder, unspecified: Secondary | ICD-10-CM | POA: Diagnosis not present

## 2024-09-22 HISTORY — PX: BRAVO PH STUDY: SHX5421

## 2024-09-22 HISTORY — PX: ESOPHAGOGASTRODUODENOSCOPY: SHX5428

## 2024-09-22 LAB — GLUCOSE, CAPILLARY: Glucose-Capillary: 120 mg/dL — ABNORMAL HIGH (ref 70–99)

## 2024-09-22 SURGERY — EGD (ESOPHAGOGASTRODUODENOSCOPY)
Anesthesia: General

## 2024-09-22 MED ORDER — PROPOFOL 1000 MG/100ML IV EMUL
INTRAVENOUS | Status: AC
  Filled 2024-09-22: qty 100

## 2024-09-22 MED ORDER — LIDOCAINE HCL (CARDIAC) PF 100 MG/5ML IV SOSY
PREFILLED_SYRINGE | INTRAVENOUS | Status: DC | PRN
  Administered 2024-09-22: 100 mg via INTRAVENOUS

## 2024-09-22 MED ORDER — LIDOCAINE HCL (PF) 2 % IJ SOLN
INTRAMUSCULAR | Status: AC
  Filled 2024-09-22: qty 5

## 2024-09-22 MED ORDER — SODIUM CHLORIDE 0.9 % IV SOLN
INTRAVENOUS | Status: DC
  Administered 2024-09-22: 20 mL/h via INTRAVENOUS

## 2024-09-22 MED ORDER — PHENYLEPHRINE 80 MCG/ML (10ML) SYRINGE FOR IV PUSH (FOR BLOOD PRESSURE SUPPORT)
PREFILLED_SYRINGE | INTRAVENOUS | Status: DC | PRN
  Administered 2024-09-22: 40 ug via INTRAVENOUS
  Administered 2024-09-22: 80 ug via INTRAVENOUS

## 2024-09-22 MED ORDER — PROPOFOL 10 MG/ML IV BOLUS
INTRAVENOUS | Status: DC | PRN
Start: 2024-09-22 — End: 2024-09-22
  Administered 2024-09-22 (×2): 50 mg via INTRAVENOUS
  Administered 2024-09-22: 100 mg via INTRAVENOUS

## 2024-09-22 MED ORDER — PHENYLEPHRINE 80 MCG/ML (10ML) SYRINGE FOR IV PUSH (FOR BLOOD PRESSURE SUPPORT)
PREFILLED_SYRINGE | INTRAVENOUS | Status: AC
  Filled 2024-09-22: qty 10

## 2024-09-22 NOTE — Transfer of Care (Signed)
 Immediate Anesthesia Transfer of Care Note  Patient: Hannah Clarke  Procedure(s) Performed: EGD (ESOPHAGOGASTRODUODENOSCOPY) PH MONITORING, ESOPHAGUS, WIRELESS  Patient Location: PACU  Anesthesia Type:General  Level of Consciousness: awake and sedated  Airway & Oxygen Therapy: Patient Spontanous Breathing and Patient connected to nasal cannula oxygen  Post-op Assessment: Report given to RN and Post -op Vital signs reviewed and stable  Post vital signs: Reviewed and stable  Last Vitals:  Vitals Value Taken Time  BP    Temp    Pulse    Resp    SpO2      Last Pain:  Vitals:   09/22/24 1049  TempSrc: Temporal  PainSc: 0-No pain         Complications: There were no known notable events for this encounter.

## 2024-09-22 NOTE — Anesthesia Preprocedure Evaluation (Addendum)
 Anesthesia Evaluation  Patient identified by MRN, date of birth, ID band Patient awake  General Assessment Comment: cognitive Impairment. Alert to self, date, location, procedure. Facility admin states she signes her own consents.   Reviewed: Allergy & Precautions, H&P , NPO status , Patient's Chart, lab work & pertinent test results  Airway Mallampati: II  TM Distance: >3 FB Neck ROM: full    Dental  (+) Missing   Pulmonary asthma , sleep apnea    Pulmonary exam normal        Cardiovascular hypertension, Normal cardiovascular exam+ dysrhythmias (Inappropriate sinus tachycardia)      Neuro/Psych  PSYCHIATRIC DISORDERS  Depression    Braces lower extremities.   Neuromuscular disease    GI/Hepatic negative GI ROS, Neg liver ROS,,,  Endo/Other  diabetes, Type 2    Renal/GU negative Renal ROS  negative genitourinary   Musculoskeletal   Abdominal Normal abdominal exam  (+)   Peds  Hematology  (+) Blood dyscrasia, anemia   Anesthesia Other Findings Past Medical History: No date: Anxiety No date: Chronic headaches No date: Depression No date: Dermatophytosis of nail No date: Diabetes mellitus without complication (HCC) No date: GERD (gastroesophageal reflux disease) No date: High cholesterol No date: History of kidney stones No date: Hypertension No date: Mental developmental delay No date: Onychia of toe No date: Panic attacks  Past Surgical History: 10/09/2017: COLONOSCOPY WITH PROPOFOL ; N/A     Comment:  Procedure: COLONOSCOPY WITH PROPOFOL ;  Surgeon: Toledo,               Ladell POUR, MD;  Location: [facility];  Service:               Gastroenterology;  Laterality: N/A; 02/06/2018: COLONOSCOPY WITH PROPOFOL ; N/A     Comment:  Procedure: COLONOSCOPY WITH PROPOFOL ;  Surgeon: Toledo,               Ladell POUR, MD;  Location: [facility];  Service:               Gastroenterology;  Laterality: N/A; 10/09/2017:  ESOPHAGOGASTRODUODENOSCOPY (EGD) WITH PROPOFOL ; N/A     Comment:  Procedure: ESOPHAGOGASTRODUODENOSCOPY (EGD) WITH               PROPOFOL ;  Surgeon: Toledo, Ladell POUR, MD;  Location:               [facility];  Service: Gastroenterology;  Laterality:               N/A; 07/31/2018: ESOPHAGOGASTRODUODENOSCOPY (EGD) WITH PROPOFOL ; N/A     Comment:  Procedure: ESOPHAGOGASTRODUODENOSCOPY (EGD) WITH               PROPOFOL ;  Surgeon: Toledo, Ladell POUR, MD;  Location:               [facility];  Service: Gastroenterology;  Laterality:               N/A; 11/29/2022: ESOPHAGOGASTRODUODENOSCOPY (EGD) WITH PROPOFOL ; N/A     Comment:  Procedure: ESOPHAGOGASTRODUODENOSCOPY (EGD) WITH               PROPOFOL ;  Surgeon: Toledo, Ladell POUR, MD;  Location:               [facility];  Service: Gastroenterology;  Laterality:               N/A; No date: HAND SURGERY No date: KIDNEY STONE SURGERY     Reproductive/Obstetrics negative OB ROS  Anesthesia Physical Anesthesia Plan  ASA: 3  Anesthesia Plan: General   Post-op Pain Management: Minimal or no pain anticipated   Induction: Intravenous  PONV Risk Score and Plan: Propofol  infusion and TIVA  Airway Management Planned: Natural Airway  Additional Equipment:   Intra-op Plan:   Post-operative Plan:   Informed Consent: I have reviewed the patients History and Physical, chart, labs and discussed the procedure including the risks, benefits and alternatives for the proposed anesthesia with the patient or authorized representative who has indicated his/her understanding and acceptance.     Dental Advisory Given  Plan Discussed with: CRNA and Surgeon  Anesthesia Plan Comments:          Anesthesia Quick Evaluation

## 2024-09-22 NOTE — H&P (Signed)
 Ruel Kung , MD [address], [place], [place], [address] Phone: [phone] Fax: [phone]  Primary Care Physician:  Carin Gauze, NP   Pre-Procedure History & Physical: HPI:  Hannah Clarke is a 58 y.o. female is here for an endoscopy    Past Medical History:  Diagnosis Date   Anxiety    Chronic headaches    Depression    Dermatophytosis of nail    Diabetes mellitus without complication (HCC)    GERD (gastroesophageal reflux disease)    High cholesterol    History of kidney stones    Hypertension    Mental developmental delay    Onychia of toe    Panic attacks     Past Surgical History:  Procedure Laterality Date   COLONOSCOPY WITH PROPOFOL  N/A 10/09/2017   Procedure: COLONOSCOPY WITH PROPOFOL ;  Surgeon: Toledo, Ladell POUR, MD;  Location: [facility] ENDOSCOPY;  Service: Gastroenterology;  Laterality: N/A;   COLONOSCOPY WITH PROPOFOL  N/A 02/06/2018   Procedure: COLONOSCOPY WITH PROPOFOL ;  Surgeon: Toledo, Ladell POUR, MD;  Location: [facility] ENDOSCOPY;  Service: Gastroenterology;  Laterality: N/A;   ESOPHAGOGASTRODUODENOSCOPY (EGD) WITH PROPOFOL  N/A 10/09/2017   Procedure: ESOPHAGOGASTRODUODENOSCOPY (EGD) WITH PROPOFOL ;  Surgeon: Toledo, Ladell POUR, MD;  Location: [facility] ENDOSCOPY;  Service: Gastroenterology;  Laterality: N/A;   ESOPHAGOGASTRODUODENOSCOPY (EGD) WITH PROPOFOL  N/A 07/31/2018   Procedure: ESOPHAGOGASTRODUODENOSCOPY (EGD) WITH PROPOFOL ;  Surgeon: Toledo, Ladell POUR, MD;  Location: [facility] ENDOSCOPY;  Service: Gastroenterology;  Laterality: N/A;   ESOPHAGOGASTRODUODENOSCOPY (EGD) WITH PROPOFOL  N/A 11/29/2022   Procedure: ESOPHAGOGASTRODUODENOSCOPY (EGD) WITH PROPOFOL ;  Surgeon: Toledo, Ladell POUR, MD;  Location: [facility] ENDOSCOPY;  Service: Gastroenterology;  Laterality: N/A;   HAND SURGERY     KIDNEY STONE SURGERY      Prior to Admission medications   Medication Sig Start Date End Date Taking? Authorizing Provider  acetaminophen  (TYLENOL ) 325 MG tablet Take 325  mg by mouth every 6 (six) hours as needed.    [provider]  albuterol  (PROVENTIL  HFA;VENTOLIN  HFA) 108 (90 BASE) MCG/ACT inhaler Inhale 2 puffs into the lungs every 6 (six) hours as needed for wheezing or shortness of breath. 11/07/15   Yvonna Alm Cough, MD  azelastine  (ASTELIN ) 0.1 % nasal spray Place 1 spray into both nostrils 2 (two) times daily. 12/13/23   Scoggins, Amber, NP  benzonatate  (TESSALON ) 100 MG capsule Take 100 mg by mouth as needed for cough.    [provider]  budesonide (PULMICORT) 0.5 MG/2ML nebulizer solution Inhale into the lungs.    [provider]  buPROPion  (WELLBUTRIN  XL) 300 MG 24 hr tablet  07/20/21   [provider]  busPIRone  (BUSPAR ) 30 MG tablet  07/20/21   [provider]  celecoxib (CELEBREX) 100 MG capsule Take 100 mg by mouth daily. 02/19/24   [provider]  cetirizine (ZYRTEC) 10 MG tablet TAKE 1 TABLET BY MOUTH EACH NIGHT AT BEDTIME FOR ALLERGY SYMPTOMS 11/05/23   Scoggins, Hospital doctor, NP  Cholecalciferol  (GNP VITAMIN D3 EXTRA STRENGTH) 25 MCG (1000 UT) tablet TAKE 1 TABLET BY MOUTH ONCE EVERY DAY 11/05/23   Scoggins, Hospital doctor, NP  citalopram  (CELEXA ) 20 MG tablet Take 20 mg by mouth daily. 05/16/24   [provider]  Dupilumab 300 MG/2ML SOPN Inject into the skin. 07/19/21   [provider]  famotidine (PEPCID) 40 MG tablet  07/20/21   [provider]  ferrous sulfate 325 (65 FE) MG tablet Take 1 tablet (325 mg total) by mouth daily. 09/09/24 09/09/25  Scoggins,  Amber, NP  fluticasone  (FLONASE ) 50 MCG/ACT nasal spray Place 1 spray into both nostrils daily. Patient not taking: Reported on 09/09/2024 12/13/23   Scoggins, Amber, NP  Fluticasone -Umeclidin-Vilant (TRELEGY ELLIPTA) 200-62.5-25 MCG/ACT AEPB Inhale into the lungs. 10/18/21   [provider]  gabapentin  (NEURONTIN ) 400 MG capsule  07/20/21   [provider]  ipratropium (ATROVENT) 0.06 % nasal spray  06/28/21    [provider]  ketorolac  (TORADOL ) 10 MG tablet Take 1 tablet (10 mg total) by mouth every 6 (six) hours as needed. Patient not taking: Reported on 09/09/2024 10/01/23   Scoggins, [facility], NP  lactulose  (CHRONULAC ) 10 GM/15ML solution Take 30 mLs (20 g total) by mouth daily as needed for mild constipation. Patient not taking: Reported on 09/09/2024 08/03/19   Robinette Vermell PARAS, MD  LINZESS  290 MCG CAPS capsule  07/05/21   [provider]  LORazepam  (ATIVAN ) 1 MG tablet Take 1 tablet (1 mg total) by mouth every 8 (eight) hours as needed for anxiety. 02/03/16   Sung, Jade J, MD  losartan -hydrochlorothiazide (HYZAAR) 100-12.5 MG tablet TAKE 1 TABLET BY MOUTH DAILY 04/07/24   Fernand Denyse LABOR, MD  lubiprostone (AMITIZA) 24 MCG capsule Take by mouth. Patient not taking: Reported on 09/09/2024 02/07/22   [provider]  metFORMIN (GLUCOPHAGE) 500 MG tablet Take 2 tablets by mouth 2 (two) times daily. 09/06/21   [provider]  metoprolol  tartrate (LOPRESSOR ) 50 MG tablet Take 1 tablet (50 mg total) by mouth 2 (two) times daily. 03/18/24 03/18/25  Fernand Denyse LABOR, MD  montelukast  (SINGULAIR ) 10 MG tablet Take 10 mg by mouth daily. 06/12/22   [provider]  nortriptyline (PAMELOR) 50 MG capsule  07/20/21   [provider]  ondansetron  (ZOFRAN ) 4 MG tablet Take 1 tablet by mouth every 8 (eight) hours as needed. 08/09/21   [provider]  pantoprazole  (PROTONIX ) 40 MG tablet TAKE 1 TABLET BY MOUTH EVERY DAY 10/08/23   Fernand Denyse LABOR, MD  Peppermint Oil (IBGARD) 90 MG CPCR TAKE 2 CAPSULES BY MOUTH 2 TIMES A DAY BEFORE MEALS 04/19/22   [provider]  polyethylene glycol (MIRALAX  MIX-IN PAX) 17 g packet Take 17 g by mouth daily. Take 3 times a day for 2 days, then 2 times a day for one week. Then may reduce to 1-2 times a day as needed. 10/15/23   Pabon, Diego F, MD  potassium chloride  SA (K-DUR,KLOR-CON ) 20 MEQ tablet Take 20 mEq by mouth daily.    [provider]  rosuvastatin (CRESTOR) 10 MG tablet TAKE 1 TABLET BY MOUTH EVERY DAY 05/05/24   Fernand Denyse LABOR, MD  sennosides-docusate sodium  (SENOKOT-S) 8.6-50 MG tablet Take 1 tablet by mouth daily.    [provider]  simethicone  (MYLICON) 125 MG chewable tablet  09/30/21   [provider]  sucralfate  (CARAFATE ) 1 g tablet Take 1 g by mouth 4 (four) times daily -  with meals and at bedtime.    [provider]  TRUE METRIX BLOOD GLUCOSE TEST test strip 1 each 2 (two) times daily. 07/03/22   [provider]  TRULICITY 3 MG/0.5ML SOAJ  04/14/24   [provider]  venlafaxine XR (EFFEXOR-XR) 75 MG 24 hr capsule Take 75 mg by mouth daily. 04/04/24   [provider]  VOQUEZNA 20 MG TABS Take by mouth. 08/24/23   [provider]    Allergies as of 09/11/2024   (No Known Allergies)    Family History  Problem Relation Age of Onset   Breast cancer Mother 29       pre pt.    Diabetes Father    Diabetes Paternal Aunt    Diabetes Paternal Uncle    Diabetes Paternal Grandmother    Diabetes Paternal Grandfather     Social History   Socioeconomic History   Marital status: Single    Spouse name: Not on file   Number of children: Not on file   Years of education: Not on file   Highest education level: Not on file  Occupational History   Not on file  Tobacco Use   Smoking status: Never   Smokeless tobacco: Never  Vaping Use   Vaping status: Never Used  Substance and Sexual Activity   Alcohol use: Not Currently    Comment: OCCASSIONALLY   Drug use: No   Sexual activity: Not on file  Other Topics Concern   Not on file  Social History Narrative   Not on file   Social Drivers of Health   Financial Resource Strain: Low Risk  (02/05/2024)   Received from [facility]   Overall Financial Resource Strain (CARDIA)    Difficulty of Paying Living Expenses: Not hard at all  Food Insecurity: No Food Insecurity  (02/05/2024)   Received [facility]   Hunger Vital Sign    Within the past 12 months, you worried that your food would run out before you got the money to buy more.: Never true    Within the past 12 months, the food you bought just didn't last and you didn't have money to get more.: Never true  Transportation Needs: Unmet Transportation Needs (02/05/2024)   Received from [facility]   PRAPARE - Transportation    In the past 12 months, has lack of transportation kept you from medical appointments or from getting medications?: No    Lack of Transportation (Non-Medical): Yes  Physical Activity: Not on file  Stress: Not on file  Social Connections: Not on file  Intimate Partner Violence: Not on file    Review of Systems: See HPI, otherwise negative ROS  Physical Exam: There were no vitals taken for this visit. General:   Alert,  pleasant and cooperative in NAD Head:  Normocephalic and atraumatic. Neck:  Supple; no masses or thyromegaly. Lungs:  Clear throughout to auscultation, normal respiratory effort.    Heart:  +S1, +S2, Regular rate and rhythm, No edema. Abdomen:  Soft, nontender and nondistended. Normal bowel sounds, without guarding, and without rebound.   Neurologic:  Alert and  oriented x4;  grossly normal neurologically.  Impression/Plan: Hannah Clarke is here for an endoscopy and placement of Ph Probe to be performed for  evaluation of refractory GERD    Risks, benefits, limitations, and alternatives regarding endoscopy have been reviewed with the patient.  Questions have been answered.  All parties agreeable.   Ruel Kung, MD  09/22/2024, 10:29 AM

## 2024-09-22 NOTE — Op Note (Signed)
 [facility] Gastroenterology Patient Name: Hannah Clarke Procedure Date: 09/22/2024 11:23 AM MRN: [id number] Account #: [id number] Date of Birth: [birth year] Admit Type: Outpatient Age: 58 Room: [facility] ENDO ROOM 1 Gender: Female Note Status: Finalized Instrument Name: Barnie GI Scope [id number] Procedure:             Upper GI endoscopy Indications:           Follow-up of gastro-esophageal reflux disease Providers:             Ruel Kung MD, MD Medicines:             Monitored Anesthesia Care Complications:         No immediate complications. Procedure:             Pre-Anesthesia Assessment:                        - Prior to the procedure, a History and Physical was                         performed, and patient medications, allergies and                         sensitivities were reviewed. The patient's tolerance                         of previous anesthesia was reviewed.                        - The risks and benefits of the procedure and the                         sedation options and risks were discussed with the                         patient. All questions were answered and informed                         consent was obtained.                        - ASA Grade Assessment: II - A patient with mild                         systemic disease.                        After obtaining informed consent, the endoscope was                         passed under direct vision. Throughout the procedure,                         the patient's blood pressure, pulse, and oxygen                         saturations were monitored continuously. The Endoscope                         was introduced through the mouth, and advanced to the  third part of duodenum. The upper GI endoscopy was                         accomplished with ease. The patient tolerated the                         procedure well. Findings:      The examined esophagus was normal. The BRAVO  capsule with delivery       system was introduced through the mouth and advanced into the esophagus,       such that the BRAVO pH capsule was positioned 28 cm from the incisors,       which was 6 cm proximal to the GE junction. The BRAVO pH capsule was       then deployed and attached to the esophageal mucosa. The delivery system       was then withdrawn. Endoscopy was utilized for probe placement and       diagnostic evaluation.      The stomach was normal.      The examined duodenum was normal.      A medium-sized hiatal hernia was present. Impression:            - Normal esophagus.                        - Normal stomach.                        - Normal examined duodenum.                        - Medium-sized hiatal hernia.                        - The BRAVO pH capsule was deployed.                        - No specimens collected. Recommendation:        - Discharge patient to home (with escort).                        - As directed.                        - Continue present medications. Procedure Code(s):     --- Professional ---                        [phone], Esophagogastroduodenoscopy, flexible,                         transoral; diagnostic, including collection of                         specimen(s) by brushing or washing, when performed                         (separate procedure) Diagnosis Code(s):     --- Professional ---                        K44.9, Diaphragmatic hernia without obstruction or  gangrene                        K21.9, Gastro-esophageal reflux disease without                         esophagitis CPT copyright 2022 American Medical Association. All rights reserved. The codes documented in this report are preliminary and upon coder review may  be revised to meet current compliance requirements. Ruel Kung, MD Ruel Kung MD, MD 09/22/2024 11:50:27 AM This report has been signed electronically. Number of Addenda: 0 Note Initiated On:  09/22/2024 11:23 AM Estimated Blood Loss:  Estimated blood loss: none.      [facility]

## 2024-09-22 NOTE — OR Nursing (Signed)
 GEJ at 34cm . Probe placed at 28cm

## 2024-09-23 NOTE — Anesthesia Postprocedure Evaluation (Signed)
 Anesthesia Post Note  Patient: Hannah Clarke  Procedure(s) Performed: EGD (ESOPHAGOGASTRODUODENOSCOPY) PH MONITORING, ESOPHAGUS, WIRELESS  Patient location during evaluation: Endoscopy Anesthesia Type: General Level of consciousness: awake and alert Pain management: pain level controlled Vital Signs Assessment: post-procedure vital signs reviewed and stable Respiratory status: spontaneous breathing, nonlabored ventilation and respiratory function stable Cardiovascular status: blood pressure returned to baseline and stable Postop Assessment: no apparent nausea or vomiting Anesthetic complications: no   There were no known notable events for this encounter.   Last Vitals:  Vitals:   09/22/24 1206 09/22/24 1216  BP: 116/77 117/78  Pulse:  72  Resp:  17  Temp:    SpO2:  97%    Last Pain:  Vitals:   09/22/24 1216  TempSrc:   PainSc: 0-No pain                 Camellia Merilee Louder

## 2024-09-30 ENCOUNTER — Other Ambulatory Visit: Payer: Self-pay | Admitting: Cardiovascular Disease

## 2024-09-30 ENCOUNTER — Other Ambulatory Visit: Payer: Self-pay | Admitting: Cardiology

## 2024-10-09 DIAGNOSIS — E1159 Type 2 diabetes mellitus with other circulatory complications: Secondary | ICD-10-CM | POA: Diagnosis not present

## 2024-10-09 DIAGNOSIS — I152 Hypertension secondary to endocrine disorders: Secondary | ICD-10-CM | POA: Diagnosis not present

## 2024-10-09 DIAGNOSIS — E785 Hyperlipidemia, unspecified: Secondary | ICD-10-CM | POA: Diagnosis not present

## 2024-10-09 DIAGNOSIS — E1165 Type 2 diabetes mellitus with hyperglycemia: Secondary | ICD-10-CM | POA: Diagnosis not present

## 2024-10-09 DIAGNOSIS — E1169 Type 2 diabetes mellitus with other specified complication: Secondary | ICD-10-CM | POA: Diagnosis not present

## 2024-10-16 DIAGNOSIS — M79674 Pain in right toe(s): Secondary | ICD-10-CM | POA: Diagnosis not present

## 2024-10-16 DIAGNOSIS — B351 Tinea unguium: Secondary | ICD-10-CM | POA: Diagnosis not present

## 2024-10-16 DIAGNOSIS — M79675 Pain in left toe(s): Secondary | ICD-10-CM | POA: Diagnosis not present

## 2024-10-16 DIAGNOSIS — J4541 Moderate persistent asthma with (acute) exacerbation: Secondary | ICD-10-CM | POA: Diagnosis not present

## 2024-10-20 DIAGNOSIS — K219 Gastro-esophageal reflux disease without esophagitis: Secondary | ICD-10-CM | POA: Diagnosis not present

## 2024-10-21 DIAGNOSIS — E876 Hypokalemia: Secondary | ICD-10-CM | POA: Diagnosis not present

## 2024-10-21 DIAGNOSIS — R0982 Postnasal drip: Secondary | ICD-10-CM | POA: Diagnosis not present

## 2024-10-21 DIAGNOSIS — M2040 Other hammer toe(s) (acquired), unspecified foot: Secondary | ICD-10-CM | POA: Diagnosis not present

## 2024-10-21 DIAGNOSIS — J4550 Severe persistent asthma, uncomplicated: Secondary | ICD-10-CM | POA: Diagnosis not present

## 2024-10-21 DIAGNOSIS — E1122 Type 2 diabetes mellitus with diabetic chronic kidney disease: Secondary | ICD-10-CM | POA: Diagnosis not present

## 2024-10-21 DIAGNOSIS — J4489 Other specified chronic obstructive pulmonary disease: Secondary | ICD-10-CM | POA: Diagnosis not present

## 2024-10-21 DIAGNOSIS — K581 Irritable bowel syndrome with constipation: Secondary | ICD-10-CM | POA: Diagnosis not present

## 2024-10-21 DIAGNOSIS — F319 Bipolar disorder, unspecified: Secondary | ICD-10-CM | POA: Diagnosis not present

## 2024-10-21 DIAGNOSIS — I879 Disorder of vein, unspecified: Secondary | ICD-10-CM | POA: Diagnosis not present

## 2024-10-21 DIAGNOSIS — E785 Hyperlipidemia, unspecified: Secondary | ICD-10-CM | POA: Diagnosis not present

## 2024-10-21 DIAGNOSIS — N181 Chronic kidney disease, stage 1: Secondary | ICD-10-CM | POA: Diagnosis not present

## 2024-10-21 DIAGNOSIS — G709 Myoneural disorder, unspecified: Secondary | ICD-10-CM | POA: Diagnosis not present

## 2024-10-21 DIAGNOSIS — Z791 Long term (current) use of non-steroidal anti-inflammatories (NSAID): Secondary | ICD-10-CM | POA: Diagnosis not present

## 2024-10-21 DIAGNOSIS — I2510 Atherosclerotic heart disease of native coronary artery without angina pectoris: Secondary | ICD-10-CM | POA: Diagnosis not present

## 2024-10-21 DIAGNOSIS — M1990 Unspecified osteoarthritis, unspecified site: Secondary | ICD-10-CM | POA: Diagnosis not present

## 2024-10-21 DIAGNOSIS — F79 Unspecified intellectual disabilities: Secondary | ICD-10-CM | POA: Diagnosis not present

## 2024-10-21 DIAGNOSIS — D509 Iron deficiency anemia, unspecified: Secondary | ICD-10-CM | POA: Diagnosis not present

## 2024-10-21 DIAGNOSIS — E1142 Type 2 diabetes mellitus with diabetic polyneuropathy: Secondary | ICD-10-CM | POA: Diagnosis not present

## 2024-10-21 DIAGNOSIS — J8283 Eosinophilic asthma: Secondary | ICD-10-CM | POA: Diagnosis not present

## 2024-10-21 DIAGNOSIS — K760 Fatty (change of) liver, not elsewhere classified: Secondary | ICD-10-CM | POA: Diagnosis not present

## 2024-10-21 DIAGNOSIS — K219 Gastro-esophageal reflux disease without esophagitis: Secondary | ICD-10-CM | POA: Diagnosis not present

## 2024-10-28 ENCOUNTER — Ambulatory Visit: Attending: Pulmonary Disease | Admitting: Speech Pathology

## 2024-10-28 DIAGNOSIS — R1312 Dysphagia, oropharyngeal phase: Secondary | ICD-10-CM | POA: Diagnosis not present

## 2024-10-29 NOTE — Therapy (Signed)
 OUTPATIENT SPEECH LANGUAGE PATHOLOGY  SWALLOW EVALUATION   Patient Name: Hannah Clarke MRN: [id number] DOB:Nov 27, 1966, 58 y.o., female Today's Date: 10/29/2024  PCP: Jeoffrey Pollen, NP REFERRING PROVIDER: Halina Picking, MD   End of Session - 10/28/24 1454     Visit Number 1    SLP Start Time 1400    SLP Stop Time  1445    SLP Time Calculation (min) 45 min    Activity Tolerance Patient tolerated treatment well          Past Medical History:  Diagnosis Date   Anxiety    Chronic headaches    Depression    Dermatophytosis of nail    Diabetes mellitus without complication (HCC)    GERD (gastroesophageal reflux disease)    High cholesterol    History of kidney stones    Hypertension    Mental developmental delay    Onychia of toe    Panic attacks    Past Surgical History:  Procedure Laterality Date   BRAVO PH STUDY N/A 09/22/2024   Procedure: PH MONITORING, ESOPHAGUS, WIRELESS;  Surgeon: Therisa Bi, MD;  Location: [facility] ENDOSCOPY;  Service: Gastroenterology;  Laterality: N/A;   COLONOSCOPY WITH PROPOFOL  N/A 10/09/2017   Procedure: COLONOSCOPY WITH PROPOFOL ;  Surgeon: Toledo, Ladell POUR, MD;  Location: [facility] ENDOSCOPY;  Service: Gastroenterology;  Laterality: N/A;   COLONOSCOPY WITH PROPOFOL  N/A 02/06/2018   Procedure: COLONOSCOPY WITH PROPOFOL ;  Surgeon: Toledo, Ladell POUR, MD;  Location: [facility] ENDOSCOPY;  Service: Gastroenterology;  Laterality: N/A;   ESOPHAGOGASTRODUODENOSCOPY N/A 09/22/2024   Procedure: EGD (ESOPHAGOGASTRODUODENOSCOPY);  Surgeon: Therisa Bi, MD;  Location[facility] ENDOSCOPY;  Service: Gastroenterology;  Laterality: N/A;  EGD with Bravo DM/Trulicity Done on day Dr. Jinny at [facility] AT [phone] FOR TIME FRIDAY   ESOPHAGOGASTRODUODENOSCOPY (EGD) WITH PROPOFOL  N/A 10/09/2017   Procedure: ESOPHAGOGASTRODUODENOSCOPY (EGD) WITH PROPOFOL ;  Surgeon: Toledo, Ladell POUR, MD;  Location: [facility] ENDOSCOPY;  Service: Gastroenterology;  Laterality: N/A;    ESOPHAGOGASTRODUODENOSCOPY (EGD) WITH PROPOFOL  N/A 07/31/2018   Procedure: ESOPHAGOGASTRODUODENOSCOPY (EGD) WITH PROPOFOL ;  Surgeon: Toledo, Ladell POUR, MD;  Location: [facility] ENDOSCOPY;  Service: Gastroenterology;  Laterality: N/A;   ESOPHAGOGASTRODUODENOSCOPY (EGD) WITH PROPOFOL  N/A 11/29/2022   Procedure: ESOPHAGOGASTRODUODENOSCOPY (EGD) WITH PROPOFOL ;  Surgeon: Toledo, Ladell POUR, MD;  Location: [facility] ENDOSCOPY;  Service: Gastroenterology;  Laterality: N/A;   HAND SURGERY     KIDNEY STONE SURGERY     Patient Active Problem List   Diagnosis Date Noted   Absolute anemia 09/09/2024   Moderate persistent asthma with exacerbation 08/12/2024   Encounter for annual health examination 08/28/2023   Acute right ankle pain 08/01/2023   Acute pain of right knee 08/01/2023   Diabetes mellitus without complication (HCC) 05/01/2023   Nausea 05/01/2023   Sleep apnea 02/13/2023   Mixed hyperlipidemia 02/13/2023   Arthritis of hand 12/13/2022   Impingement syndrome of shoulder region 12/13/2022   Benign essential hypertension 07/07/2020   Blood in urine 07/07/2020   Proteinuria 07/07/2020   Type 2 diabetes mellitus with diabetic nephropathy (HCC) 07/07/2020   Renal stone 07/07/2020   Microhematuria 04/22/2020   Left lower quadrant abdominal pain 04/22/2020   Nephrolithiasis 04/22/2020   Headache disorder 02/27/2020   Photophobia 02/27/2020   Sleeping difficulty 02/27/2020   Intractable nausea and vomiting 08/01/2018   Unstable angina (HCC) 08/17/2017    ONSET DATE: date of referral 08/13/2024   REFERRING DIAG:  K21.00 (ICD-10-CM) - Gastroesophageal reflux disease with esophagitis without hemorrhage  K44.9,K21.9 (ICD-10-CM) - Hiatal hernia with gastroesophageal  reflux    THERAPY DIAG:  Dysphagia, oropharyngeal phase  Rationale for Evaluation and Treatment Rehabilitation  SUBJECTIVE:   SUBJECTIVE STATEMENT: Pt pleasant, accompanied by caregiver (staff from [facility]) Pt accompanied  by: caregiver  PERTINENT HISTORY and DIAGNOSTIC FINDINGS: Hannah Clarke is a 58 y.o. female who presents to the [facility] for 55-month follow up of IBS-C, GERD without esophagitis, and persistent abdominal bloating. History is difficult given patient's baseline cognitive deficits. Chronic GI symptoms continue to be an issue for patient. She continues to deal with heartburn, reflux, nausea, and dysphagia. She saw Dr. Aleskerov last month in Pulmonology for routine follow-up and was referred to speech pathology for concerns of ongoing dysphagia. She remains on Dupixent for eosinophilic asthma. She feels like her hernia is a big problem for her. She has been told surgery is not an option for her hernia. She called our office last month with concerns of worsening GERD symptoms. She is concerned about regurgitation. She continues to endorse significant abdominal bloating. She has had episodes of vomiting. She denies any odynophagia.  Pulmonolgy  08/12/2024  Difficulty eating due to reflux.  She experiences significant gastroesophageal reflux with regurgitation of food after consumption, which has hindered her ability to undergo surgery for a hernia. She is currently on pantoprazole  twice daily for acid reflux.  She has issues with swallowing, describing a sensation of a 'little hole' in her throat where food gets stuck, impacting her ability to take medications properly.  She is on Celebrex for knee pain, which can upset her stomach and contribute to her eating difficulties. Despite this, she continues to take it due to knee pain.   PAIN:  Are you having pain? No  FALLS: Has patient fallen in last 6 months?  No  LIVING ENVIRONMENT: Lives with: lives independently thru [employer]  PLOF:  Level of assistance: Needed assistance with ADLs, Needed assistance with IADLS Employment: On disability   PATIENT GOALS  to improve swallow function  OBJECTIVE:   COGNITION: Overall  cognitive status: History of cognitive impairments - at baseline  ORAL MOTOR EXAMINATION All at baseline  CLINICAL SWALLOW ASSESSMENT:   Current diet: regular and thin liquids Dentition: adequate natural dentition Feeding: able to feed self Consistencies tested: Thin Liquid: Presentation: Spoon and Self-fed Oral Phase: WFL Pharyngeal Phase: WFL Puree: Presentation: Spoon and By hand Oral Phase: WFL Pharyngeal Phase: WFL Regular: Presentation: Self-fed Oral Phase: WFL Pharyngeal Phase: WFL   Evaluation findings: Patient presents with oropharyngeal swallow which appears clinically to be within functional limits with adequate airway protection. Oral stage is characterized by appearance of adequate oral containment, mastication, bolus formation, oral transfer and oral clearance. Swallow initiation appears timely. No overt signs of aspiration observed despite challenging with consecutive straw sips of thin liquids in excess of 3oz.  Aspiration risk factors:History of GERD and History of esophageal-related issues Overall aspiration risk:Minimal and Moderate Diet Recommendations: regular and thin liquids Precautions:Minimize environmental distractions, Slow rate, Small sips/bites, Seated upright 90 degrees, and Remain upright for at least 30 minutes after meals Supervision: Patient able to feed self Oral care recommendations:Oral care BID Follow-up recommendations: No treatment recommended     TODAY'S TREATMENT:  Recommend taking pills with applesauce, following strict aspiration/reflux precautions   PATIENT EDUCATION: Education details: results of this assessment Person educated: Patient and Caregiver from [employer] Education method: Explanation and Handouts Education comprehension: verbalized understanding   ASSESSMENT:  CLINICAL IMPRESSION: During this evaluation pt presents with adequate  oropharyngeal abilities when consuming puree, graham crackers and thin liquids.  Pt appears at reduced risk of aspiration form an oropharyngeal perspective but given significant history and continued report of reflux, she is at increased risk of post prandial aspiration. Education provide don aspiration and reflux precautions. No further ST services are indicated.     Jeson Camacho B. Rubbie, M.S., CCC-SLP, Tree Surgeon Certified Brain Injury Specialist [facility]  [facility] Rehabilitation Services Office [phone] Ascom [phone] Fax [phone]

## 2024-10-31 ENCOUNTER — Other Ambulatory Visit: Payer: Self-pay | Admitting: Cardiology

## 2024-11-04 ENCOUNTER — Ambulatory Visit: Admitting: Speech Pathology

## 2024-11-04 DIAGNOSIS — J4541 Moderate persistent asthma with (acute) exacerbation: Secondary | ICD-10-CM | POA: Diagnosis not present

## 2024-11-06 ENCOUNTER — Ambulatory Visit: Admitting: Speech Pathology

## 2024-11-11 ENCOUNTER — Ambulatory Visit: Admitting: Speech Pathology

## 2024-11-13 ENCOUNTER — Ambulatory Visit: Admitting: Speech Pathology

## 2024-11-18 ENCOUNTER — Ambulatory Visit: Admitting: Speech Pathology

## 2024-11-20 ENCOUNTER — Ambulatory Visit: Admitting: Speech Pathology

## 2024-11-25 ENCOUNTER — Ambulatory Visit: Admitting: Speech Pathology

## 2024-12-02 ENCOUNTER — Ambulatory Visit: Admitting: Speech Pathology

## 2024-12-02 ENCOUNTER — Other Ambulatory Visit
Admission: RE | Admit: 2024-12-02 | Discharge: 2024-12-02 | Disposition: A | Discharge status: Home / Self Care | Source: Ambulatory Visit | Attending: Pulmonary Disease | Admitting: Pulmonary Disease

## 2024-12-02 DIAGNOSIS — R000 Tachycardia, unspecified: Secondary | ICD-10-CM | POA: Diagnosis present

## 2024-12-02 DIAGNOSIS — R0602 Shortness of breath: Secondary | ICD-10-CM | POA: Diagnosis present

## 2024-12-02 LAB — D-DIMER, QUANTITATIVE: D-Dimer, Quant: <0.27 ug{FEU}/mL (ref 0.00–0.50)

## 2024-12-03 NOTE — Progress Notes (Signed)
 Hannah Clarke                                          MRN: [id number]   12/03/2024   The [facility] Quality Team Specialist reviewed this patient medical record for the purposes of chart review for care gap closure. The following were reviewed: chart review for care gap closure-controlling blood pressure.    [facility] Quality Team

## 2024-12-09 ENCOUNTER — Ambulatory Visit: Admitting: Speech Pathology

## 2024-12-11 ENCOUNTER — Ambulatory Visit: Admitting: Speech Pathology

## 2024-12-16 ENCOUNTER — Ambulatory Visit: Admitting: Speech Pathology

## 2024-12-18 ENCOUNTER — Ambulatory Visit: Admitting: Speech Pathology

## 2024-12-23 ENCOUNTER — Ambulatory Visit: Admitting: Speech Pathology

## 2025-01-01 ENCOUNTER — Ambulatory Visit: Admitting: Cardiovascular Disease

## 2025-01-06 ENCOUNTER — Ambulatory Visit: Admitting: Cardiovascular Disease

## 2025-01-08 ENCOUNTER — Other Ambulatory Visit

## 2025-01-08 DIAGNOSIS — E782 Mixed hyperlipidemia: Secondary | ICD-10-CM

## 2025-01-08 DIAGNOSIS — E1121 Type 2 diabetes mellitus with diabetic nephropathy: Secondary | ICD-10-CM

## 2025-01-08 DIAGNOSIS — I10 Essential (primary) hypertension: Secondary | ICD-10-CM

## 2025-01-09 ENCOUNTER — Ambulatory Visit: Payer: Self-pay | Admitting: Cardiology

## 2025-01-09 LAB — CMP14+EGFR
ALT: 13 [IU]/L (ref 0–32)
AST: 8 [IU]/L (ref 0–40)
Albumin: 4.7 g/dL (ref 3.8–4.9)
Alkaline Phosphatase: 76 [IU]/L (ref 49–135)
BUN/Creatinine Ratio: 30 — ABNORMAL HIGH (ref 9–23)
BUN: 34 mg/dL — ABNORMAL HIGH (ref 6–24)
Bilirubin Total: 0.4 mg/dL (ref 0.0–1.2)
CO2: 22 mmol/L (ref 20–29)
Calcium: 9.6 mg/dL (ref 8.7–10.2)
Chloride: 100 mmol/L (ref 96–106)
Creatinine, Ser: 1.13 mg/dL — ABNORMAL HIGH (ref 0.57–1.00)
Globulin, Total: 1.6 g/dL (ref 1.5–4.5)
Glucose: 119 mg/dL — ABNORMAL HIGH (ref 70–99)
Potassium: 3.3 mmol/L — ABNORMAL LOW (ref 3.5–5.2)
Sodium: 142 mmol/L (ref 134–144)
Total Protein: 6.3 g/dL (ref 6.0–8.5)
eGFR: 57 mL/min/{1.73_m2} — ABNORMAL LOW

## 2025-01-09 LAB — CBC WITH DIFFERENTIAL/PLATELET
Basophils Absolute: 0.1 10*3/uL (ref 0.0–0.2)
Basos: 1 %
EOS (ABSOLUTE): 0.1 10*3/uL (ref 0.0–0.4)
Eos: 2 %
Hematocrit: 37.3 % (ref 34.0–46.6)
Hemoglobin: 11.9 g/dL (ref 11.1–15.9)
Immature Grans (Abs): 0 10*3/uL (ref 0.0–0.1)
Immature Granulocytes: 0 %
Lymphocytes Absolute: 1.2 10*3/uL (ref 0.7–3.1)
Lymphs: 13 %
MCH: 27.4 pg (ref 26.6–33.0)
MCHC: 31.9 g/dL (ref 31.5–35.7)
MCV: 86 fL (ref 79–97)
Monocytes Absolute: 0.7 10*3/uL (ref 0.1–0.9)
Monocytes: 8 %
Neutrophils Absolute: 7.3 10*3/uL — ABNORMAL HIGH (ref 1.4–7.0)
Neutrophils: 76 %
Platelets: 260 10*3/uL (ref 150–450)
RBC: 4.34 x10E6/uL (ref 3.77–5.28)
RDW: 14.5 % (ref 11.7–15.4)
WBC: 9.5 10*3/uL (ref 3.4–10.8)

## 2025-01-09 LAB — LIPID PANEL
Chol/HDL Ratio: 2.6 ratio (ref 0.0–4.4)
Cholesterol, Total: 97 mg/dL — ABNORMAL LOW (ref 100–199)
HDL: 38 mg/dL — ABNORMAL LOW
LDL Chol Calc (NIH): 30 mg/dL (ref 0–99)
Triglycerides: 180 mg/dL — ABNORMAL HIGH (ref 0–149)
VLDL Cholesterol Cal: 29 mg/dL (ref 5–40)

## 2025-01-09 LAB — HEMOGLOBIN A1C
Est. average glucose Bld gHb Est-mCnc: 143 mg/dL
Hgb A1c MFr Bld: 6.6 % — ABNORMAL HIGH (ref 4.8–5.6)

## 2025-01-13 ENCOUNTER — Ambulatory Visit: Admitting: Cardiology

## 2025-01-15 ENCOUNTER — Ambulatory Visit: Admitting: Cardiovascular Disease

## 2025-08-05 ENCOUNTER — Ambulatory Visit: Admitting: Urology

## 2025-08-05 ENCOUNTER — Ambulatory Visit: Admitting: Physician Assistant
